# Patient Record
Sex: Male | Born: 1935 | Race: White | Hispanic: No | Smoking: Former smoker
Health system: Southern US, Community
[De-identification: ages and names within clinical notes are randomized; demographics above are authoritative.]

## PROBLEM LIST (undated history)

## (undated) DIAGNOSIS — R1310 Dysphagia, unspecified: Secondary | ICD-10-CM

## (undated) DIAGNOSIS — C801 Malignant (primary) neoplasm, unspecified: Secondary | ICD-10-CM

## (undated) DIAGNOSIS — M1990 Unspecified osteoarthritis, unspecified site: Secondary | ICD-10-CM

## (undated) DIAGNOSIS — I639 Cerebral infarction, unspecified: Secondary | ICD-10-CM

## (undated) DIAGNOSIS — S065XAA Traumatic subdural hemorrhage with loss of consciousness status unknown, initial encounter: Secondary | ICD-10-CM

## (undated) DIAGNOSIS — I10 Essential (primary) hypertension: Secondary | ICD-10-CM

## (undated) DIAGNOSIS — S065X9A Traumatic subdural hemorrhage with loss of consciousness of unspecified duration, initial encounter: Secondary | ICD-10-CM

## (undated) HISTORY — DX: Traumatic subdural hemorrhage with loss of consciousness status unknown, initial encounter: S06.5XAA

## (undated) HISTORY — PX: OTHER SURGICAL HISTORY: SHX169

## (undated) HISTORY — DX: Traumatic subdural hemorrhage with loss of consciousness of unspecified duration, initial encounter: S06.5X9A

## (undated) HISTORY — DX: Dysphagia, unspecified: R13.10

## (undated) HISTORY — DX: Cerebral infarction, unspecified: I63.9

## (undated) HISTORY — PX: PROSTATE CRYOABLATION: SUR358

---

## 2003-05-05 ENCOUNTER — Other Ambulatory Visit: Payer: Self-pay

## 2004-09-11 ENCOUNTER — Ambulatory Visit: Payer: Self-pay | Admitting: Urology

## 2005-03-04 ENCOUNTER — Ambulatory Visit: Payer: Self-pay | Admitting: Urology

## 2005-09-04 ENCOUNTER — Ambulatory Visit: Payer: Self-pay | Admitting: Urology

## 2006-01-12 ENCOUNTER — Emergency Department: Payer: Self-pay | Admitting: Emergency Medicine

## 2006-01-19 ENCOUNTER — Ambulatory Visit: Payer: Self-pay | Admitting: Urology

## 2006-01-19 ENCOUNTER — Other Ambulatory Visit: Payer: Self-pay

## 2006-01-21 ENCOUNTER — Ambulatory Visit: Payer: Self-pay | Admitting: Urology

## 2006-01-29 ENCOUNTER — Ambulatory Visit: Payer: Self-pay | Admitting: Urology

## 2006-09-16 ENCOUNTER — Ambulatory Visit: Payer: Self-pay | Admitting: Urology

## 2007-09-14 ENCOUNTER — Ambulatory Visit: Payer: Self-pay | Admitting: Urology

## 2008-03-14 ENCOUNTER — Ambulatory Visit: Payer: Self-pay | Admitting: Urology

## 2009-03-27 ENCOUNTER — Ambulatory Visit: Payer: Self-pay | Admitting: Urology

## 2010-04-16 ENCOUNTER — Ambulatory Visit: Payer: Self-pay | Admitting: Urology

## 2010-04-18 ENCOUNTER — Ambulatory Visit: Payer: Self-pay | Admitting: Urology

## 2012-12-15 ENCOUNTER — Other Ambulatory Visit: Payer: Self-pay | Admitting: Orthopedic Surgery

## 2017-01-05 ENCOUNTER — Other Ambulatory Visit: Payer: Self-pay | Admitting: Internal Medicine

## 2017-01-05 DIAGNOSIS — D649 Anemia, unspecified: Secondary | ICD-10-CM

## 2017-01-05 DIAGNOSIS — R634 Abnormal weight loss: Secondary | ICD-10-CM

## 2017-01-05 DIAGNOSIS — C61 Malignant neoplasm of prostate: Secondary | ICD-10-CM

## 2017-01-20 ENCOUNTER — Ambulatory Visit
Admission: RE | Admit: 2017-01-20 | Discharge: 2017-01-20 | Disposition: A | Discharge status: Home / Self Care | Payer: Medicare Other | Source: Ambulatory Visit | Attending: Internal Medicine | Admitting: Internal Medicine

## 2017-01-20 DIAGNOSIS — N200 Calculus of kidney: Secondary | ICD-10-CM | POA: Insufficient documentation

## 2017-01-20 DIAGNOSIS — C61 Malignant neoplasm of prostate: Secondary | ICD-10-CM | POA: Insufficient documentation

## 2017-01-20 DIAGNOSIS — N281 Cyst of kidney, acquired: Secondary | ICD-10-CM | POA: Diagnosis not present

## 2017-01-20 DIAGNOSIS — I708 Atherosclerosis of other arteries: Secondary | ICD-10-CM | POA: Diagnosis not present

## 2017-01-20 DIAGNOSIS — D71 Functional disorders of polymorphonuclear neutrophils: Secondary | ICD-10-CM | POA: Diagnosis not present

## 2017-01-20 DIAGNOSIS — R634 Abnormal weight loss: Secondary | ICD-10-CM | POA: Diagnosis present

## 2017-01-20 DIAGNOSIS — D649 Anemia, unspecified: Secondary | ICD-10-CM | POA: Diagnosis present

## 2017-01-20 HISTORY — DX: Essential (primary) hypertension: I10

## 2017-01-20 HISTORY — DX: Malignant (primary) neoplasm, unspecified: C80.1

## 2017-01-20 MED ORDER — IOPAMIDOL (ISOVUE-300) INJECTION 61%
75.0000 mL | Freq: Once | INTRAVENOUS | Status: AC | PRN
  Administered 2017-01-20: 75 mL via INTRAVENOUS

## 2017-10-22 ENCOUNTER — Encounter: Payer: Self-pay | Admitting: Internal Medicine

## 2017-11-24 ENCOUNTER — Other Ambulatory Visit: Payer: Self-pay

## 2017-11-24 ENCOUNTER — Ambulatory Visit: Payer: Medicare Other | Admitting: Gastroenterology

## 2017-11-24 VITALS — BP 154/76 | HR 59 | Ht 71.0 in | Wt 135.4 lb

## 2017-11-24 DIAGNOSIS — R1310 Dysphagia, unspecified: Secondary | ICD-10-CM

## 2017-11-24 NOTE — Progress Notes (Signed)
   Jonathon Bellows MD, MRCP(U.K) [address]  [place]  [place], [place] [address]  Main: [phone]  Fax: [phone]   Gastroenterology Consultation  Referring Provider:     Albina Billet, MD Primary Care Physician:  Tracie Harrier, MD Primary Gastroenterologist:  Dr. Jonathon Bellows  Reason for Consultation:     Dysphagia         HPI:   [name] is a 84 y.o. y/o male referred for consultation & management  by Dr. Tracie Harrier, MD.     He has been referred for difficulty with swallowing . He says that he noticed 4-5 months was having issues swallowing some pills, then he got the smaller pills and then had no issues. Presently he says that the only time he has issue with swallowing is when he eats too fast or does not chew properly then gets stuck in his throat. Liquids go down fine. Quit smoking 15 years back. He says that he lost weight which he says that he quit playing golf and was eating more at that time and since he says he is not eating much . Worked with computers in the past .   Denies any heartburn . No medications for acid reflux. No pain while swallowing.    Past Medical History:  Diagnosis Date  . Cancer ([place])   . Hypertension       Prior to Admission medications   Not on File    No family history on file.   Social History   Tobacco Use  . Smoking status: Not on file  Substance Use Topics  . Alcohol use: Not on file  . Drug use: Not on file    Allergies as of 11/24/2017  . (No Known Allergies)    Review of Systems:    All systems reviewed and negative except where noted in HPI.   Physical Exam:  There were no vitals taken for this visit. No LMP for male patient. Psych:  Alert and cooperative. Normal mood and affect. General:   Alert,  Well-developed, well-nourished, pleasant and cooperative in NAD Head:  Normocephalic and atraumatic. Eyes:  Sclera clear, no icterus.   Conjunctiva pink. Ears:  Normal auditory acuity. Nose:   No deformity, discharge, or lesions. Mouth:  No deformity or lesions,oropharynx pink & moist. Neck:  Supple; no masses or thyromegaly. Lungs:  Respirations even and unlabored.  Clear throughout to auscultation.   No wheezes, crackles, or rhonchi. No acute distress. Heart:  Regular rate and rhythm; no murmurs, clicks, rubs, or gallops. Abdomen:  Normal bowel sounds.  No bruits.  Soft, non-tender and non-distended without masses, hepatosplenomegaly or hernias noted.  No guarding or rebound tenderness.    Neurologic:  Alert and oriented x3;  grossly normal neurologically. Skin:  Intact without significant lesions or rashes. No jaundice. Lymph Nodes:  No significant cervical adenopathy. Psych:  Alert and cooperative. Normal mood and affect.  Imaging Studies: No results found.  Assessment and Plan:   [name] is a 84 y.o. y/o male has been referred for dysphagia.    Plan  1. EGD  I have discussed alternative options, risks & benefits,  which include, but are not limited to, bleeding, infection, perforation,respiratory complication & drug reaction.  The patient agrees with this plan & written consent will be obtained.    Follow up PRN  Dr Jonathon Bellows MD,MRCP(U.K)

## 2017-12-01 ENCOUNTER — Ambulatory Visit
Admission: RE | Admit: 2017-12-01 | Discharge: 2017-12-01 | Disposition: A | Discharge status: Home / Self Care | Payer: Medicare Other | Source: Ambulatory Visit | Attending: Gastroenterology | Admitting: Gastroenterology

## 2017-12-01 ENCOUNTER — Encounter
Admission: RE | Disposition: A | Discharge status: Home / Self Care | Payer: Self-pay | Source: Ambulatory Visit | Attending: Gastroenterology

## 2017-12-01 DIAGNOSIS — R1310 Dysphagia, unspecified: Secondary | ICD-10-CM

## 2017-12-01 SURGERY — ESOPHAGOGASTRODUODENOSCOPY (EGD) WITH PROPOFOL
Anesthesia: General

## 2017-12-01 MED ORDER — SODIUM CHLORIDE 0.9 % IV SOLN: INTRAVENOUS | Status: DC

## 2017-12-03 ENCOUNTER — Encounter: Payer: Self-pay | Admitting: Emergency Medicine

## 2017-12-06 ENCOUNTER — Encounter: Payer: Self-pay | Admitting: Certified Registered Nurse Anesthetist

## 2017-12-06 ENCOUNTER — Ambulatory Visit: Payer: Medicare Other | Admitting: Certified Registered Nurse Anesthetist

## 2017-12-06 ENCOUNTER — Ambulatory Visit
Admission: RE | Admit: 2017-12-06 | Discharge: 2017-12-06 | Disposition: A | Discharge status: Home / Self Care | Payer: Medicare Other | Source: Ambulatory Visit | Attending: Gastroenterology | Admitting: Gastroenterology

## 2017-12-06 ENCOUNTER — Encounter
Admission: RE | Disposition: A | Discharge status: Home / Self Care | Payer: Self-pay | Source: Ambulatory Visit | Attending: Gastroenterology

## 2017-12-06 DIAGNOSIS — Z7982 Long term (current) use of aspirin: Secondary | ICD-10-CM | POA: Diagnosis not present

## 2017-12-06 DIAGNOSIS — Z79899 Other long term (current) drug therapy: Secondary | ICD-10-CM | POA: Diagnosis not present

## 2017-12-06 DIAGNOSIS — Z87891 Personal history of nicotine dependence: Secondary | ICD-10-CM | POA: Diagnosis not present

## 2017-12-06 DIAGNOSIS — I10 Essential (primary) hypertension: Secondary | ICD-10-CM | POA: Diagnosis not present

## 2017-12-06 DIAGNOSIS — K2270 Barrett's esophagus without dysplasia: Secondary | ICD-10-CM | POA: Insufficient documentation

## 2017-12-06 DIAGNOSIS — R1310 Dysphagia, unspecified: Secondary | ICD-10-CM | POA: Diagnosis present

## 2017-12-06 DIAGNOSIS — Z8546 Personal history of malignant neoplasm of prostate: Secondary | ICD-10-CM | POA: Insufficient documentation

## 2017-12-06 HISTORY — PX: ESOPHAGOGASTRODUODENOSCOPY (EGD) WITH PROPOFOL: SHX5813

## 2017-12-06 HISTORY — DX: Unspecified osteoarthritis, unspecified site: M19.90

## 2017-12-06 SURGERY — ESOPHAGOGASTRODUODENOSCOPY (EGD) WITH PROPOFOL
Anesthesia: General

## 2017-12-06 MED ORDER — PROPOFOL 500 MG/50ML IV EMUL
INTRAVENOUS | Status: DC | PRN
  Administered 2017-12-06: 175 ug/kg/min via INTRAVENOUS

## 2017-12-06 MED ORDER — PHENYLEPHRINE HCL 10 MG/ML IJ SOLN
INTRAMUSCULAR | Status: AC
  Filled 2017-12-06: qty 1

## 2017-12-06 MED ORDER — EPHEDRINE SULFATE 50 MG/ML IJ SOLN
INTRAMUSCULAR | Status: AC
  Filled 2017-12-06: qty 1

## 2017-12-06 MED ORDER — SODIUM CHLORIDE 0.9 % IV SOLN
INTRAVENOUS | Status: DC
  Administered 2017-12-06: 08:00:00 via INTRAVENOUS

## 2017-12-06 MED ORDER — LIDOCAINE HCL (CARDIAC) PF 100 MG/5ML IV SOSY
PREFILLED_SYRINGE | INTRAVENOUS | Status: DC | PRN
  Administered 2017-12-06: 50 mg via INTRAVENOUS

## 2017-12-06 MED ORDER — PROPOFOL 10 MG/ML IV BOLUS
INTRAVENOUS | Status: DC | PRN
  Administered 2017-12-06: 50 mg via INTRAVENOUS

## 2017-12-06 MED ORDER — LIDOCAINE HCL (PF) 2 % IJ SOLN
INTRAMUSCULAR | Status: AC
  Filled 2017-12-06: qty 10

## 2017-12-06 NOTE — Op Note (Signed)
[facility] Gastroenterology Patient Name: [name] Procedure Date: 12/06/2017 7:51 AM MRN: [id number] Account #: [id number] Date of Birth: May 26, 1933 Admit Type: Outpatient Age: 84 Room: [facility] ENDO ROOM 4 Gender: Male Note Status: Finalized Procedure:            Upper GI endoscopy Indications:          Dysphagia Providers:            Jonathon Bellows MD, MD Referring MD:         Leona Carry. Hall Busing, MD (Referring MD) Medicines:            Monitored Anesthesia Care Procedure:            Pre-Anesthesia Assessment:                       - Prior to the procedure, a History and Physical was                        performed, and patient medications, allergies and                        sensitivities were reviewed. The patient's tolerance of                        previous anesthesia was reviewed.                       - The risks and benefits of the procedure and the                        sedation options and risks were discussed with the                        patient. All questions were answered and informed                        consent was obtained.                       - ASA Grade Assessment: III - A patient with severe                        systemic disease.                       After obtaining informed consent, the endoscope was                        passed under direct vision. Throughout the procedure,                        the patient's blood pressure, pulse, and oxygen                        saturations were monitored continuously. The Endoscope                        was introduced through the mouth, and advanced to the                        third part of duodenum. The upper GI endoscopy  was                        accomplished with ease. The patient tolerated the                        procedure well. Findings:      The examined duodenum was normal.      The stomach was normal.      The cardia and gastric fundus were normal on retroflexion.      One  tongue of salmon-colored mucosa was present from 42 to 43 cm. No       other visible abnormalities were present. The maximum longitudinal       extent of these esophageal mucosal changes was 1 cm in length. Biopsies       were taken with a cold forceps for histology. Biopsies were obtained       from the proximal and distal esophagus with cold forceps for histology       of suspected eosinophilic esophagitis. Impression:           - Normal examined duodenum.                       - Normal stomach.                       - Salmon-colored mucosa suspicious for short-segment                        Barrett's esophagus. Biopsied. Recommendation:       - Discharge patient to home (with escort).                       - Resume previous diet.                       - Continue present medications.                       - Await pathology results.                       - Return to my office in 2 weeks. Procedure Code(s):    --- Professional ---                       [phone], Esophagogastroduodenoscopy, flexible, transoral;                        with biopsy, single or multiple Diagnosis Code(s):    --- Professional ---                       K22.8, Other specified diseases of esophagus                       R13.10, Dysphagia, unspecified CPT copyright 2017 American Medical Association. All rights reserved. The codes documented in this report are preliminary and upon coder review may  be revised to meet current compliance requirements. Jonathon Bellows, MD Jonathon Bellows MD, MD 12/06/2017 8:09:12 AM This report has been signed electronically. Number of Addenda: 0 Note Initiated On: 12/06/2017 7:51 AM      [facility]

## 2017-12-06 NOTE — Anesthesia Post-op Follow-up Note (Signed)
Anesthesia QCDR form completed.        

## 2017-12-06 NOTE — Transfer of Care (Signed)
Immediate Anesthesia Transfer of Care Note  Patient: [name]  Procedure(s) Performed: ESOPHAGOGASTRODUODENOSCOPY (EGD) WITH PROPOFOL (N/A )  Patient Location: PACU  Anesthesia Type:Glenn  Level of Consciousness: awake, alert  and oriented  Airway & Oxygen Therapy: Patient Spontanous Breathing  Post-op Assessment: Report given to RN and Post -op Vital signs reviewed and stable  Post vital signs: Reviewed and stable  Last Vitals:  Vitals Value Taken Time  BP 148/91 12/06/2017  8:14 AM  Temp 36.1 C 12/06/2017  8:14 AM  Pulse 67 12/06/2017  8:14 AM  Resp 18 12/06/2017  8:14 AM  SpO2 100 % 12/06/2017  8:14 AM  Vitals shown include unvalidated device data.  Last Pain:  Vitals:   12/06/17 0814  TempSrc: Tympanic  PainSc:          Complications: No apparent anesthesia complications

## 2017-12-06 NOTE — Anesthesia Postprocedure Evaluation (Signed)
Anesthesia Post Note  Patient: [name]  Procedure(s) Performed: ESOPHAGOGASTRODUODENOSCOPY (EGD) WITH PROPOFOL (N/A )  Patient location during evaluation: PACU Anesthesia Type: General Level of consciousness: awake and alert Pain management: pain level controlled Vital Signs Assessment: post-procedure vital signs reviewed and stable Respiratory status: spontaneous breathing, nonlabored ventilation, respiratory function stable and patient connected to nasal cannula oxygen Cardiovascular status: blood pressure returned to baseline and stable Postop Assessment: no apparent nausea or vomiting Anesthetic complications: no     Last Vitals:  Vitals:   12/06/17 0814 12/06/17 0833  BP: (!) 148/91 (!) 174/91  Pulse: 73   Resp: 20   Temp: (!) 36.1 C   SpO2: 100%     Last Pain:  Vitals:   12/06/17 0834  TempSrc:   PainSc: 0-No pain                 Molli Barrows

## 2017-12-06 NOTE — H&P (Signed)
Jonathon Bellows, MD [address] [place], [place], [place], [address] [address], [place], [place], [place], [address] Phone: [phone]  Fax: [phone]  Primary Care Physician:  Albina Billet, MD   Pre-Procedure History & Physical: HPI:  [name] is a 84 y.o. male is here for an endoscopy    Past Medical History:  Diagnosis Date  . Arthritis   . Cancer [facility])    Prostate Cancer  . Hypertension     Past Surgical History:  Procedure Laterality Date  . PROSTATE CRYOABLATION      Prior to Admission medications   Medication Sig Start Date End Date Taking? Authorizing Provider  aspirin 325 MG EC tablet Take 325 mg by mouth every 4 (four) hours as needed for pain (2 tablets every 4 hours during the day for arthritis pain).   Yes [provider]  lisinopril (PRINIVIL,ZESTRIL) 40 MG tablet TAKE 1 TABLET BY MOUTH ONCE DAILY 12/31/14  Yes [provider]    Allergies as of 12/01/2017  . (No Known Allergies)    History reviewed. No pertinent family history.  Social History   Socioeconomic History  . Marital status: Widowed    Spouse name: Not on file  . Number of children: Not on file  . Years of education: Not on file  . Highest education level: Not on file  Occupational History  . Not on file  Social Needs  . Financial resource strain: Not on file  . Food insecurity:    Worry: Not on file    Inability: Not on file  . Transportation needs:    Medical: Not on file    Non-medical: Not on file  Tobacco Use  . Smoking status: Former Smoker    Types: Pipe    Last attempt to quit: 03/30/2002    Years since quitting: 15.6  . Smokeless tobacco: Never Used  Substance and Sexual Activity  . Alcohol use: Not on file  . Drug use: Never  . Sexual activity: Not on file  Lifestyle  . Physical activity:    Days per week: Not on file    Minutes per session: Not on file  . Stress: Not on file  Relationships  . Social connections:   Talks on phone: Not on file    Gets together: Not on file    Attends religious service: Not on file    Active member of club or organization: Not on file    Attends meetings of clubs or organizations: Not on file    Relationship status: Not on file  . Intimate partner violence:    Fear of current or ex partner: Not on file    Emotionally abused: Not on file    Physically abused: Not on file    Forced sexual activity: Not on file  Other Topics Concern  . Not on file  Social History Narrative  . Not on file    Review of Systems: See HPI, otherwise negative ROS  Physical Exam: BP (!) 178/105   Pulse 63   Temp (!) 97 F (36.1 C) (Tympanic)   Resp 16   SpO2 98%  General:   Alert,  pleasant and cooperative in NAD Head:  Normocephalic and atraumatic. Neck:  Supple; no masses or thyromegaly. Lungs:  Clear throughout to auscultation, normal respiratory effort.    Heart:  +S1, +S2, Regular rate and rhythm, No edema. Abdomen:  Soft, nontender and nondistended. Normal bowel sounds, without guarding,  and without rebound.   Neurologic:  Alert and  oriented x4;  grossly normal neurologically.  Impression/Plan: [name] is here for an endoscopy  to be performed for  evaluation of dysphagia    Risks, benefits, limitations, and alternatives regarding endoscopy and dilation have been reviewed with the patient.  Questions have been answered.  All parties agreeable.   Jonathon Bellows, MD  12/06/2017, 7:51 AM

## 2017-12-06 NOTE — Anesthesia Procedure Notes (Signed)
Date/Time: 12/06/2017 7:56 AM Performed by: Johnna Acosta, CRNA Pre-anesthesia Checklist: Patient identified, Emergency Drugs available, Suction available, Patient being monitored and Timeout performed Patient Re-evaluated:Patient Re-evaluated prior to induction Oxygen Delivery Method: Nasal cannula Preoxygenation: Pre-oxygenation with 100% oxygen

## 2017-12-06 NOTE — Anesthesia Preprocedure Evaluation (Signed)
Anesthesia Evaluation  Patient identified by MRN, date of birth, ID band Patient awake    Reviewed: Allergy & Precautions, H&P , NPO status , Patient's Chart, lab work & pertinent test results, reviewed documented beta blocker date and time   Airway Mallampati: II   Neck ROM: full    Dental  (+) Poor Dentition   Pulmonary neg pulmonary ROS, former smoker,    Pulmonary exam normal        Cardiovascular Exercise Tolerance: Good hypertension, On Medications negative cardio ROS Normal cardiovascular exam Rhythm:regular Rate:Normal     Neuro/Psych negative neurological ROS  negative psych ROS   GI/Hepatic negative GI ROS, Neg liver ROS,   Endo/Other  negative endocrine ROS  Renal/GU negative Renal ROS  negative genitourinary   Musculoskeletal   Abdominal   Peds  Hematology negative hematology ROS (+)   Anesthesia Other Findings Past Medical History: No date: Arthritis No date: Cancer ([place])     Comment:  Prostate Cancer No date: Hypertension Past Surgical History: No date: PROSTATE CRYOABLATION   Reproductive/Obstetrics negative OB ROS                             Anesthesia Physical Anesthesia Plan  ASA: II  Anesthesia Plan: General   Post-op Pain Management:    Induction:   PONV Risk Score and Plan:   Airway Management Planned:   Additional Equipment:   Intra-op Plan:   Post-operative Plan:   Informed Consent: I have reviewed the patients History and Physical, chart, labs and discussed the procedure including the risks, benefits and alternatives for the proposed anesthesia with the patient or authorized representative who has indicated his/her understanding and acceptance.   Dental Advisory Given  Plan Discussed with: CRNA  Anesthesia Plan Comments:         Anesthesia Quick Evaluation

## 2017-12-07 ENCOUNTER — Encounter: Payer: Self-pay | Admitting: Gastroenterology

## 2017-12-08 LAB — SURGICAL PATHOLOGY

## 2017-12-12 ENCOUNTER — Encounter: Payer: Self-pay | Admitting: Gastroenterology

## 2017-12-30 ENCOUNTER — Encounter: Payer: Self-pay | Admitting: Gastroenterology

## 2017-12-30 ENCOUNTER — Ambulatory Visit (INDEPENDENT_AMBULATORY_CARE_PROVIDER_SITE_OTHER): Payer: Medicare Other | Admitting: Gastroenterology

## 2017-12-30 VITALS — BP 154/82 | HR 61 | Ht 71.0 in | Wt 139.0 lb

## 2017-12-30 DIAGNOSIS — R1310 Dysphagia, unspecified: Secondary | ICD-10-CM | POA: Diagnosis not present

## 2017-12-30 NOTE — Progress Notes (Signed)
   Jonathon Bellows MD, MRCP(U.K) [address]  [place]  [place], [place] [address]  Main: [phone]  Fax: [phone]   Primary Care Physician: Albina Billet, MD  Primary Gastroenterologist:  Dr. Jonathon Bellows   No chief complaint on file.   HPI: YASHAR INCLAN is a 84 y.o. male   Summary of history : He has been referred for difficulty with swallowing . He says that he noticed 4-5 months was having issues swallowing some pills, then he got the smaller pills and then had no issues. Presently he says that the only time he has issue with swallowing is when he eats too fast or does not chew properly then gets stuck in his throat. Liquids go down fine. Quit smoking 15 years back. He says that he lost weight which he says that he quit playing golf and was eating more at that time and since he says he is not eating much . Worked with computers in the past .   Denies any heartburn . No medications for acid reflux. No pain while swallowing.   Interval history  11/24/17-12/30/17   12/06/17: EGD: no stricture - biopsies show inflammation of the Ge junction .   No issues swallowing . Doing very well .    Current Outpatient Medications  Medication Sig Dispense Refill  . aspirin 325 MG EC tablet Take 325 mg by mouth every 4 (four) hours as needed for pain (2 tablets every 4 hours during the day for arthritis pain).    Marland Kitchen lisinopril (PRINIVIL,ZESTRIL) 40 MG tablet TAKE 1 TABLET BY MOUTH ONCE DAILY     No current facility-administered medications for this visit.     Allergies as of 12/30/2017  . (No Known Allergies)    ROS:  General: Negative for anorexia, weight loss, fever, chills, fatigue, weakness. ENT: Negative for hoarseness, difficulty swallowing , nasal congestion. CV: Negative for chest pain, angina, palpitations, dyspnea on exertion, peripheral edema.  Respiratory: Negative for dyspnea at rest, dyspnea on exertion, cough, sputum, wheezing.  GI: See history of present  illness. GU:  Negative for dysuria, hematuria, urinary incontinence, urinary frequency, nocturnal urination.  Endo: Negative for unusual weight change.    Physical Examination:   There were no vitals taken for this visit.  General: Well-nourished, well-developed in no acute distress.  Eyes: No icterus. Conjunctivae pink. Mouth: Oropharyngeal mucosa moist and pink , no lesions erythema or exudate. Lungs: Clear to auscultation bilaterally. Non-labored. Heart: Regular rate and rhythm, no murmurs rubs or gallops.  Abdomen: Bowel sounds are normal, nontender, nondistended, no hepatosplenomegaly or masses, no abdominal bruits or hernia , no rebound or guarding.   Extremities: No lower extremity edema. No clubbing or deformities. Neuro: Alert and oriented x 3.  Grossly intact. Skin: Warm and dry, no jaundice.   Psych: Alert and cooperative, normal mood and affect.   Imaging Studies: No results found.  Assessment and Plan:   ATHA MCBAIN is a 84 y.o. y/o male for dysphagia. All issues have resolved. No stricture on EGD   Dr Jonathon Bellows  MD,MRCP [facility]) Follow up as needed

## 2018-09-19 ENCOUNTER — Emergency Department: Payer: Medicare Other

## 2018-09-19 ENCOUNTER — Other Ambulatory Visit: Payer: Self-pay

## 2018-09-19 ENCOUNTER — Inpatient Hospital Stay
Admission: EM | Admit: 2018-09-19 | Discharge: 2018-10-14 | DRG: 082 | Disposition: A | Discharge status: Skilled Nursing Facility | Payer: Medicare Other | Attending: Internal Medicine | Admitting: Internal Medicine

## 2018-09-19 DIAGNOSIS — I951 Orthostatic hypotension: Secondary | ICD-10-CM | POA: Diagnosis present

## 2018-09-19 DIAGNOSIS — S40812A Abrasion of left upper arm, initial encounter: Secondary | ICD-10-CM | POA: Diagnosis present

## 2018-09-19 DIAGNOSIS — D649 Anemia, unspecified: Secondary | ICD-10-CM

## 2018-09-19 DIAGNOSIS — R7401 Elevation of levels of liver transaminase levels: Secondary | ICD-10-CM

## 2018-09-19 DIAGNOSIS — D61818 Other pancytopenia: Secondary | ICD-10-CM | POA: Diagnosis present

## 2018-09-19 DIAGNOSIS — Z931 Gastrostomy status: Secondary | ICD-10-CM | POA: Diagnosis not present

## 2018-09-19 DIAGNOSIS — Z66 Do not resuscitate: Secondary | ICD-10-CM | POA: Diagnosis present

## 2018-09-19 DIAGNOSIS — D696 Thrombocytopenia, unspecified: Secondary | ICD-10-CM

## 2018-09-19 DIAGNOSIS — S0083XA Contusion of other part of head, initial encounter: Secondary | ICD-10-CM | POA: Diagnosis not present

## 2018-09-19 DIAGNOSIS — R55 Syncope and collapse: Secondary | ICD-10-CM

## 2018-09-19 DIAGNOSIS — R972 Elevated prostate specific antigen [PSA]: Secondary | ICD-10-CM

## 2018-09-19 DIAGNOSIS — Z681 Body mass index (BMI) 19 or less, adult: Secondary | ICD-10-CM | POA: Diagnosis not present

## 2018-09-19 DIAGNOSIS — K921 Melena: Secondary | ICD-10-CM

## 2018-09-19 DIAGNOSIS — I10 Essential (primary) hypertension: Secondary | ICD-10-CM | POA: Diagnosis present

## 2018-09-19 DIAGNOSIS — R4702 Dysphasia: Secondary | ICD-10-CM | POA: Diagnosis present

## 2018-09-19 DIAGNOSIS — S40811A Abrasion of right upper arm, initial encounter: Secondary | ICD-10-CM | POA: Diagnosis present

## 2018-09-19 DIAGNOSIS — Z79899 Other long term (current) drug therapy: Secondary | ICD-10-CM

## 2018-09-19 DIAGNOSIS — K264 Chronic or unspecified duodenal ulcer with hemorrhage: Secondary | ICD-10-CM | POA: Diagnosis present

## 2018-09-19 DIAGNOSIS — E440 Moderate protein-calorie malnutrition: Secondary | ICD-10-CM | POA: Diagnosis present

## 2018-09-19 DIAGNOSIS — E870 Hyperosmolality and hypernatremia: Secondary | ICD-10-CM | POA: Diagnosis not present

## 2018-09-19 DIAGNOSIS — R9721 Rising PSA following treatment for malignant neoplasm of prostate: Secondary | ICD-10-CM | POA: Diagnosis present

## 2018-09-19 DIAGNOSIS — R471 Dysarthria and anarthria: Secondary | ICD-10-CM | POA: Diagnosis present

## 2018-09-19 DIAGNOSIS — Y92009 Unspecified place in unspecified non-institutional (private) residence as the place of occurrence of the external cause: Secondary | ICD-10-CM | POA: Diagnosis not present

## 2018-09-19 DIAGNOSIS — R509 Fever, unspecified: Secondary | ICD-10-CM | POA: Diagnosis not present

## 2018-09-19 DIAGNOSIS — S065X9A Traumatic subdural hemorrhage with loss of consciousness of unspecified duration, initial encounter: Principal | ICD-10-CM | POA: Diagnosis present

## 2018-09-19 DIAGNOSIS — R740 Nonspecific elevation of levels of transaminase and lactic acid dehydrogenase [LDH]: Secondary | ICD-10-CM | POA: Diagnosis not present

## 2018-09-19 DIAGNOSIS — S0990XA Unspecified injury of head, initial encounter: Secondary | ICD-10-CM

## 2018-09-19 DIAGNOSIS — R778 Other specified abnormalities of plasma proteins: Secondary | ICD-10-CM

## 2018-09-19 DIAGNOSIS — M8580 Other specified disorders of bone density and structure, unspecified site: Secondary | ICD-10-CM | POA: Diagnosis present

## 2018-09-19 DIAGNOSIS — N179 Acute kidney failure, unspecified: Secondary | ICD-10-CM | POA: Diagnosis present

## 2018-09-19 DIAGNOSIS — A7749 Other ehrlichiosis: Secondary | ICD-10-CM | POA: Diagnosis present

## 2018-09-19 DIAGNOSIS — B9561 Methicillin susceptible Staphylococcus aureus infection as the cause of diseases classified elsewhere: Secondary | ICD-10-CM | POA: Diagnosis not present

## 2018-09-19 DIAGNOSIS — D62 Acute posthemorrhagic anemia: Secondary | ICD-10-CM | POA: Diagnosis present

## 2018-09-19 DIAGNOSIS — E876 Hypokalemia: Secondary | ICD-10-CM | POA: Diagnosis present

## 2018-09-19 DIAGNOSIS — W19XXXA Unspecified fall, initial encounter: Secondary | ICD-10-CM | POA: Diagnosis present

## 2018-09-19 DIAGNOSIS — R58 Hemorrhage, not elsewhere classified: Secondary | ICD-10-CM | POA: Diagnosis not present

## 2018-09-19 DIAGNOSIS — K9422 Gastrostomy infection: Secondary | ICD-10-CM | POA: Diagnosis not present

## 2018-09-19 DIAGNOSIS — B951 Streptococcus, group B, as the cause of diseases classified elsewhere: Secondary | ICD-10-CM | POA: Diagnosis not present

## 2018-09-19 DIAGNOSIS — R05 Cough: Secondary | ICD-10-CM

## 2018-09-19 DIAGNOSIS — M25551 Pain in right hip: Secondary | ICD-10-CM | POA: Diagnosis present

## 2018-09-19 DIAGNOSIS — K2270 Barrett's esophagus without dysplasia: Secondary | ICD-10-CM | POA: Diagnosis present

## 2018-09-19 DIAGNOSIS — R059 Cough, unspecified: Secondary | ICD-10-CM

## 2018-09-19 DIAGNOSIS — Z87891 Personal history of nicotine dependence: Secondary | ICD-10-CM | POA: Diagnosis not present

## 2018-09-19 DIAGNOSIS — L89151 Pressure ulcer of sacral region, stage 1: Secondary | ICD-10-CM | POA: Diagnosis not present

## 2018-09-19 DIAGNOSIS — R1311 Dysphagia, oral phase: Secondary | ICD-10-CM | POA: Diagnosis not present

## 2018-09-19 DIAGNOSIS — M25552 Pain in left hip: Secondary | ICD-10-CM | POA: Diagnosis present

## 2018-09-19 DIAGNOSIS — Z7189 Other specified counseling: Secondary | ICD-10-CM | POA: Diagnosis not present

## 2018-09-19 DIAGNOSIS — L8990 Pressure ulcer of unspecified site, unspecified stage: Secondary | ICD-10-CM | POA: Insufficient documentation

## 2018-09-19 DIAGNOSIS — Z20828 Contact with and (suspected) exposure to other viral communicable diseases: Secondary | ICD-10-CM | POA: Diagnosis present

## 2018-09-19 DIAGNOSIS — D689 Coagulation defect, unspecified: Secondary | ICD-10-CM | POA: Diagnosis not present

## 2018-09-19 DIAGNOSIS — D500 Iron deficiency anemia secondary to blood loss (chronic): Secondary | ICD-10-CM

## 2018-09-19 DIAGNOSIS — A7740 Ehrlichiosis, unspecified: Secondary | ICD-10-CM | POA: Diagnosis not present

## 2018-09-19 DIAGNOSIS — Z7982 Long term (current) use of aspirin: Secondary | ICD-10-CM

## 2018-09-19 DIAGNOSIS — R945 Abnormal results of liver function studies: Secondary | ICD-10-CM | POA: Diagnosis not present

## 2018-09-19 DIAGNOSIS — R1310 Dysphagia, unspecified: Secondary | ICD-10-CM

## 2018-09-19 DIAGNOSIS — M1990 Unspecified osteoarthritis, unspecified site: Secondary | ICD-10-CM | POA: Diagnosis present

## 2018-09-19 DIAGNOSIS — D509 Iron deficiency anemia, unspecified: Secondary | ICD-10-CM | POA: Diagnosis present

## 2018-09-19 DIAGNOSIS — Z960 Presence of urogenital implants: Secondary | ICD-10-CM | POA: Diagnosis not present

## 2018-09-19 DIAGNOSIS — W57XXXA Bitten or stung by nonvenomous insect and other nonvenomous arthropods, initial encounter: Secondary | ICD-10-CM | POA: Diagnosis present

## 2018-09-19 DIAGNOSIS — S0003XA Contusion of scalp, initial encounter: Secondary | ICD-10-CM | POA: Diagnosis not present

## 2018-09-19 DIAGNOSIS — Z8546 Personal history of malignant neoplasm of prostate: Secondary | ICD-10-CM | POA: Diagnosis not present

## 2018-09-19 DIAGNOSIS — R296 Repeated falls: Secondary | ICD-10-CM | POA: Diagnosis present

## 2018-09-19 DIAGNOSIS — Z9889 Other specified postprocedural states: Secondary | ICD-10-CM | POA: Diagnosis not present

## 2018-09-19 DIAGNOSIS — I639 Cerebral infarction, unspecified: Secondary | ICD-10-CM | POA: Diagnosis not present

## 2018-09-19 DIAGNOSIS — Z515 Encounter for palliative care: Secondary | ICD-10-CM | POA: Diagnosis not present

## 2018-09-19 DIAGNOSIS — E860 Dehydration: Secondary | ICD-10-CM | POA: Diagnosis present

## 2018-09-19 DIAGNOSIS — R000 Tachycardia, unspecified: Secondary | ICD-10-CM | POA: Diagnosis present

## 2018-09-19 DIAGNOSIS — M25559 Pain in unspecified hip: Secondary | ICD-10-CM

## 2018-09-19 DIAGNOSIS — R791 Abnormal coagulation profile: Secondary | ICD-10-CM | POA: Diagnosis not present

## 2018-09-19 DIAGNOSIS — R634 Abnormal weight loss: Secondary | ICD-10-CM | POA: Diagnosis not present

## 2018-09-19 DIAGNOSIS — R7989 Other specified abnormal findings of blood chemistry: Secondary | ICD-10-CM

## 2018-09-19 DIAGNOSIS — A4901 Methicillin susceptible Staphylococcus aureus infection, unspecified site: Secondary | ICD-10-CM | POA: Diagnosis not present

## 2018-09-19 LAB — CBC WITH DIFFERENTIAL/PLATELET
Abs Immature Granulocytes: 0 10*3/uL (ref 0.00–0.07)
Basophils Absolute: 0 10*3/uL (ref 0.0–0.1)
Basophils Relative: 0 %
Eosinophils Absolute: 0 10*3/uL (ref 0.0–0.5)
Eosinophils Relative: 0 %
HCT: 28.5 % — ABNORMAL LOW (ref 39.0–52.0)
Hemoglobin: 9.9 g/dL — ABNORMAL LOW (ref 13.0–17.0)
Lymphocytes Relative: 5 %
Lymphs Abs: 0.2 10*3/uL — ABNORMAL LOW (ref 0.7–4.0)
MCH: 29.9 pg (ref 26.0–34.0)
MCHC: 34.7 g/dL (ref 30.0–36.0)
MCV: 86.1 fL (ref 80.0–100.0)
Monocytes Absolute: 0.1 10*3/uL (ref 0.1–1.0)
Monocytes Relative: 3 %
Neutro Abs: 4.1 10*3/uL (ref 1.7–7.7)
Neutrophils Relative %: 92 %
Platelets: 57 10*3/uL — ABNORMAL LOW (ref 150–400)
RBC: 3.31 MIL/uL — ABNORMAL LOW (ref 4.22–5.81)
RDW: 13 % (ref 11.5–15.5)
Smear Review: NORMAL
WBC: 4.5 10*3/uL (ref 4.0–10.5)
nRBC: 0 % (ref 0.0–0.2)

## 2018-09-19 LAB — URINALYSIS, COMPLETE (UACMP) WITH MICROSCOPIC
Bilirubin Urine: NEGATIVE
Glucose, UA: NEGATIVE mg/dL
Ketones, ur: NEGATIVE mg/dL
Leukocytes,Ua: NEGATIVE
Nitrite: NEGATIVE
Protein, ur: 30 mg/dL — AB
RBC / HPF: >50 RBC/hpf — ABNORMAL HIGH (ref 0–5)
Specific Gravity, Urine: 1.01 (ref 1.005–1.030)
pH: 5 (ref 5.0–8.0)

## 2018-09-19 LAB — COMPREHENSIVE METABOLIC PANEL
ALT: 74 U/L — ABNORMAL HIGH (ref 0–44)
AST: 158 U/L — ABNORMAL HIGH (ref 15–41)
Albumin: 3 g/dL — ABNORMAL LOW (ref 3.5–5.0)
Alkaline Phosphatase: 76 U/L (ref 38–126)
Anion gap: 11 (ref 5–15)
BUN: 46 mg/dL — ABNORMAL HIGH (ref 8–23)
CO2: 20 mmol/L — ABNORMAL LOW (ref 22–32)
Calcium: 7.9 mg/dL — ABNORMAL LOW (ref 8.9–10.3)
Chloride: 105 mmol/L (ref 98–111)
Creatinine, Ser: 1.81 mg/dL — ABNORMAL HIGH (ref 0.61–1.24)
GFR calc Af Amer: 39 mL/min — ABNORMAL LOW (ref >60)
GFR calc non Af Amer: 34 mL/min — ABNORMAL LOW (ref >60)
Glucose, Bld: 145 mg/dL — ABNORMAL HIGH (ref 70–99)
Potassium: 2.5 mmol/L — CL (ref 3.5–5.1)
Sodium: 136 mmol/L (ref 135–145)
Total Bilirubin: 0.9 mg/dL (ref 0.3–1.2)
Total Protein: 5.5 g/dL — ABNORMAL LOW (ref 6.5–8.1)

## 2018-09-19 LAB — TROPONIN I
Troponin I: 0.19 ng/mL (ref <0.03)
Troponin I: 0.17 ng/mL (ref <0.03)

## 2018-09-19 LAB — LACTIC ACID, PLASMA: Lactic Acid, Venous: 1.8 mmol/L (ref 0.5–1.9)

## 2018-09-19 LAB — SARS CORONAVIRUS 2 BY RT PCR (HOSPITAL ORDER, PERFORMED IN ~~LOC~~ HOSPITAL LAB): SARS Coronavirus 2: NEGATIVE

## 2018-09-19 LAB — POTASSIUM: Potassium: 3.1 mmol/L — ABNORMAL LOW (ref 3.5–5.1)

## 2018-09-19 LAB — GLUCOSE, CAPILLARY: Glucose-Capillary: 148 mg/dL — ABNORMAL HIGH (ref 70–99)

## 2018-09-19 MED ORDER — ACETAMINOPHEN 500 MG PO TABS
1000.0000 mg | ORAL_TABLET | Freq: Once | ORAL | Status: AC
  Administered 2018-09-19: 1000 mg via ORAL
  Filled 2018-09-19: qty 2

## 2018-09-19 MED ORDER — KCL IN DEXTROSE-NACL 40-5-0.45 MEQ/L-%-% IV SOLN
INTRAVENOUS | Status: DC
  Administered 2018-09-19: 18:00:00 via INTRAVENOUS
  Filled 2018-09-19 (×6): qty 1000

## 2018-09-19 MED ORDER — ONDANSETRON HCL 4 MG PO TABS: 4.0000 mg | ORAL_TABLET | Freq: Four times a day (QID) | ORAL | Status: DC | PRN

## 2018-09-19 MED ORDER — ACETAMINOPHEN 325 MG PO TABS
650.0000 mg | ORAL_TABLET | Freq: Four times a day (QID) | ORAL | Status: DC | PRN
  Administered 2018-09-19 – 2018-09-20 (×3): 650 mg via ORAL
  Filled 2018-09-19 (×4): qty 2

## 2018-09-19 MED ORDER — ASPIRIN EC 325 MG PO TBEC: 325.0000 mg | DELAYED_RELEASE_TABLET | ORAL | Status: DC | PRN

## 2018-09-19 MED ORDER — ASPIRIN 81 MG PO CHEW
324.0000 mg | CHEWABLE_TABLET | Freq: Once | ORAL | Status: AC
  Administered 2018-09-19: 18:00:00 324 mg via ORAL
  Filled 2018-09-19: qty 4

## 2018-09-19 MED ORDER — SODIUM CHLORIDE 0.9 % IV SOLN
Freq: Once | INTRAVENOUS | Status: AC
  Administered 2018-09-19: 16:00:00 via INTRAVENOUS

## 2018-09-19 MED ORDER — POLYETHYLENE GLYCOL 3350 17 G PO PACK: 17.0000 g | PACK | Freq: Every day | ORAL | Status: DC | PRN

## 2018-09-19 MED ORDER — ACETAMINOPHEN 500 MG PO TABS
ORAL_TABLET | ORAL | Status: AC
  Filled 2018-09-19: qty 2

## 2018-09-19 MED ORDER — POTASSIUM CHLORIDE CRYS ER 20 MEQ PO TBCR
40.0000 meq | EXTENDED_RELEASE_TABLET | Freq: Once | ORAL | Status: AC
  Administered 2018-09-19: 18:00:00 40 meq via ORAL
  Filled 2018-09-19: qty 2

## 2018-09-19 MED ORDER — ONDANSETRON HCL 4 MG/2ML IJ SOLN: 4.0000 mg | Freq: Four times a day (QID) | INTRAMUSCULAR | Status: DC | PRN

## 2018-09-19 MED ORDER — HYDROCODONE-ACETAMINOPHEN 5-325 MG PO TABS: 1.0000 | ORAL_TABLET | ORAL | Status: DC | PRN

## 2018-09-19 MED ORDER — SODIUM CHLORIDE 0.9 % IV SOLN
INTRAVENOUS | Status: DC
  Administered 2018-09-19 – 2018-09-21 (×2): via INTRAVENOUS

## 2018-09-19 MED ORDER — ACETAMINOPHEN 650 MG RE SUPP: 650.0000 mg | Freq: Four times a day (QID) | RECTAL | Status: DC | PRN

## 2018-09-19 NOTE — H&P (Signed)
[facility] [place] NAME: [name]    MR#:  [id number]  DATE OF BIRTH:  07-Apr-1933  DATE OF ADMISSION:  09/19/2018  PRIMARY CARE PHYSICIAN: Albina Billet, MD   REQUESTING/REFERRING PHYSICIAN: dr Jimmye Norman  CHIEF COMPLAINT:   Fall and passing out HISTORY OF PRESENT ILLNESS:  [name]  is a 85 y.o. male with a known history of hypertension who presents today to the emergency room due to for falls today at home.  Patient reports for the past 3 days he has had dizziness and feelings of lightheadedness.  He denies chest pain, shortness of breath, cough, fever or urinary symptoms.  He was noted to have large amount of bruising on the left side of his forehead and skin tears.  His blood pressure was also noted to be low when EMS arrived.  He was given 250 cc normal saline bolus.   Patient does report since he has been secluded from friends he has decreased appetite.  He continues to take medication for hypertension.  PAST MEDICAL HISTORY:   Past Medical History:  Diagnosis Date  . Arthritis   . Cancer [facility])    Prostate Cancer  . Hypertension     PAST SURGICAL HISTORY:   Past Surgical History:  Procedure Laterality Date  . ESOPHAGOGASTRODUODENOSCOPY (EGD) WITH PROPOFOL N/A 12/06/2017   Procedure: ESOPHAGOGASTRODUODENOSCOPY (EGD) WITH PROPOFOL;  Surgeon: Jonathon Bellows, MD;  [facility];  Service: Gastroenterology;  Laterality: N/A;  . PROSTATE CRYOABLATION      SOCIAL HISTORY:   Social History   Tobacco Use  . Smoking status: Former Smoker    Types: Pipe    Quit date: 03/30/2002    Years since quitting: 16.4  . Smokeless tobacco: Never Used  Substance Use Topics  . Alcohol use: Yes    FAMILY HISTORY:  History reviewed. No pertinent family history.  DRUG ALLERGIES:  No Known Allergies  REVIEW OF SYSTEMS:   Review of Systems  Constitutional: Positive for malaise/fatigue and weight loss. Negative for chills  and fever.  HENT: Negative.  Negative for ear discharge, ear pain, hearing loss, nosebleeds and sore throat.   Eyes: Negative.  Negative for blurred vision and pain.  Respiratory: Negative.  Negative for cough, hemoptysis, shortness of breath and wheezing.   Cardiovascular: Negative.  Negative for chest pain, palpitations and leg swelling.  Gastrointestinal: Negative.  Negative for abdominal pain, blood in stool, diarrhea, nausea and vomiting.  Genitourinary: Negative.  Negative for dysuria.  Musculoskeletal: Positive for falls. Negative for back pain.  Skin: Negative.   Neurological: Positive for dizziness. Negative for tremors, speech change, focal weakness, seizures and headaches.  Endo/Heme/Allergies: Negative.  Does not bruise/bleed easily.  Psychiatric/Behavioral: Negative.  Negative for depression, hallucinations and suicidal ideas.    MEDICATIONS AT HOME:   Prior to Admission medications   Medication Sig Start Date End Date Taking? Authorizing Provider  aspirin 325 MG EC tablet Take 325 mg by mouth every 4 (four) hours as needed for pain (2 tablets every 4 hours during the day for arthritis pain).    [provider]  lisinopril (PRINIVIL,ZESTRIL) 40 MG tablet TAKE 1 TABLET BY MOUTH ONCE DAILY 12/31/14   [provider]      VITAL SIGNS:  Blood pressure (!) 96/55, pulse 98, temperature (!) 103.2 F (39.6 C), temperature source Oral, resp. rate (!) 24, height 6' (1.829 m), weight 61.2 kg, SpO2 98 %.  PHYSICAL EXAMINATION:   Physical  Exam Constitutional:      General: He is not in acute distress. HENT:     Head: Normocephalic.  Eyes:     General: No scleral icterus. Neck:     Musculoskeletal: Normal range of motion and neck supple.     Vascular: No JVD.     Trachea: No tracheal deviation.  Cardiovascular:     Rate and Rhythm: Normal rate and regular rhythm.     Heart sounds: Normal heart sounds. No murmur. No friction rub. No gallop.   Pulmonary:      Effort: Pulmonary effort is normal. No respiratory distress.     Breath sounds: Normal breath sounds. No wheezing or rales.  Chest:     Chest wall: No tenderness.  Abdominal:     General: Bowel sounds are normal. There is no distension.     Palpations: Abdomen is soft. There is no mass.     Tenderness: There is no abdominal tenderness. There is no guarding or rebound.  Musculoskeletal: Normal range of motion.  Skin:    General: Skin is warm.     Coloration: Skin is not pale.     Findings: Bruising and lesion present. No erythema or rash.     Comments: Bruising noted on arms and left forehead.  Multiple excoriations from his fall  Neurological:     Mental Status: He is alert and oriented to person, place, and time.  Psychiatric:        Judgment: Judgment normal.       LABORATORY PANEL:   CBC Recent Labs  Lab 09/19/18 1535  WBC 4.5  HGB 9.9*  HCT 28.5*  PLT 57*   ------------------------------------------------------------------------------------------------------------------  Chemistries  Recent Labs  Lab 09/19/18 1535  NA 136  K 2.5*  CL 105  CO2 20*  GLUCOSE 145*  BUN 46*  CREATININE 1.81*  CALCIUM 7.9*  AST 158*  ALT 74*  ALKPHOS 76  BILITOT 0.9   ------------------------------------------------------------------------------------------------------------------  Cardiac Enzymes Recent Labs  Lab 09/19/18 1535  TROPONINI 0.19*   ------------------------------------------------------------------------------------------------------------------  RADIOLOGY:  Dg Chest 1 View  Result Date: 09/19/2018 CLINICAL DATA:  85 year old male with weakness and fever. COVID-19 status pending. EXAM: CHEST  1 VIEW COMPARISON:  CT Abdomen and Pelvis 01/20/2017. FINDINGS: Portable AP upright view at 1506 hours. Calcified aortic atherosclerosis. Other mediastinal contours are within normal limits. Visualized tracheal air column is within normal limits. Lung volumes are at  the upper limits of normal. No pneumothorax, pleural effusion or pulmonary edema. No confluent pulmonary opacity. External button artifact suspected over the anterior 2nd rib on image #1. Chronic appearing posterior right rib fractures. Negative visible bowel gas pattern. IMPRESSION: No acute cardiopulmonary abnormality. Electronically Signed   By: Genevie Ann M.D.   On: 09/19/2018 15:39   Ct Head Wo Contrast  Result Date: 09/19/2018 CLINICAL DATA:  Pt states he fell and hit his head on the bath tub today. Pt denies LOC EXAM: CT HEAD WITHOUT CONTRAST TECHNIQUE: Contiguous axial images were obtained from the base of the skull through the vertex without intravenous contrast. COMPARISON:  None. FINDINGS: Brain: No evidence of acute infarction, hemorrhage, extra-axial collection, ventriculomegaly, or mass effect. Generalized cerebral atrophy. Periventricular white matter low attenuation likely secondary to microangiopathy. Vascular: Cerebrovascular atherosclerotic calcifications are noted. Skull: Negative for fracture or focal lesion. Sinuses/Orbits: Visualized portions of the orbits are unremarkable. Visualized portions of the paranasal sinuses and mastoid air cells are unremarkable. Other: Large left frontal scalp hematoma. IMPRESSION: No acute intracranial pathology.  Electronically Signed   By: Kathreen Devoid   On: 09/19/2018 15:42    EKG:  Sinus rhythm no ST elevation or depression. There are some ST and T inversion  IMPRESSION AND PLAN:   85 year old male with history of hypertension who has had decreased appetite for several weeks who presents after multiple falls today and syncopal episode.  1.  Syncope with hypotension: This could be purely from dehydration, hypokalemia or cardiac in nature. Continue telemetry monitoring Follow troponins and if they continue to increase then will need cardiology consultation. No anticoagulation due to low platelets. Check echocardiogram Check orthostatics Physical  therapy consultation and case management consultation placed IV fluids started CT head unremarkable 2.  Acute kidney injury in the setting of dehydration: Hold nephrotoxic medications including blood pressure medications and start IV fluids.  BMP in a.m.  3.  Elevated troponin with syncope: Continue telemetry and follow-up on echocardiogram.  Cardiology consultation if needed.  4.  Elevated liver function test likely due to dehydration: Repeat comprehensive metabolic panel in a.m.  5.  Fever: Patient had a fever in the emergency room.  Follow-up on final blood cultures. No antibiotics for now as no clear source. Chest x-ray and UA are unremarkable.  6.  Hypertension: Hold home medication for now due to low blood pressure and dehydration as well as acute kidney injury. Continue to follow  7. HYpokalemia: REplete and chenck MG  All the records are reviewed and case discussed with ED provider. Management plans discussed with the patient and he is in agreement  CODE STATUS: DNR  TOTAL TIME TAKING CARE OF THIS PATIENT: 45 minutes.    Bettey Costa M.D on 09/19/2018 at 6:12 PM  Between 7am to 6pm - Pager - [phone]  After 6pm go to [email or web address] - password [email or web address] [facility]  Office  [phone]  CC: Primary care physician; Albina Billet, MD

## 2018-09-19 NOTE — ED Notes (Signed)
BG 148. Pt leaving for CT.

## 2018-09-19 NOTE — ED Notes (Signed)
ED TO INPATIENT HANDOFF REPORT  ED Nurse Name and Phone #: Anson Crofts Name/Age/Gender [name] 85 y.o. male Room/Bed: ED08A/ED08A  Code Status   Code Status: DNR  Home/SNF/Other Home Patient oriented to: self, place, time and situation Is this baseline? Yes   Triage Complete: Triage complete  Chief Complaint Fall  Triage Note Pt in via [facility] from home d/t fall x4; pt reports dizziness and "blacking out" before these falls. Pt has bruising to forehead and skin tears on arms. Pt received 250cc NS bolus before EMS arrived then EMS started 500cc bolus per EMS. Pt history of HTN and pt takes lisinopril. Pt's VSS with EMS. BG 183 per EMS.    Allergies No Known Allergies  Level of Care/Admitting Diagnosis ED Disposition    ED Disposition Condition [facility]: [facility] [100120]  Level of Care: Telemetry [5]  Covid Evaluation: N/A  Diagnosis: Syncope [299371]  Admitting Physician: Bettey Costa [[id number]]  Attending Physician: MODY, Ulice Bold [[id number]]  Estimated length of stay: past midnight tomorrow  Certification:: I certify this patient will need inpatient services for at least 2 midnights  PT Class (Do Not Modify): Inpatient [101]  PT Acc Code (Do Not Modify): Private [1]       B Medical/Surgery History Past Medical History:  Diagnosis Date  . Arthritis   . Cancer [facility])    Prostate Cancer  . Hypertension    Past Surgical History:  Procedure Laterality Date  . ESOPHAGOGASTRODUODENOSCOPY (EGD) WITH PROPOFOL N/A 12/06/2017   Procedure: ESOPHAGOGASTRODUODENOSCOPY (EGD) WITH PROPOFOL;  Surgeon: Jonathon Bellows, MD;  Location: [facility] ENDOSCOPY;  Service: Gastroenterology;  Laterality: N/A;  . PROSTATE CRYOABLATION       A IV Location/Drains/Wounds Patient Lines/Drains/Airways Status   Active Line/Drains/Airways    Name:   Placement date:   Placement time:   Site:   Days:   Peripheral IV 09/19/18 Right Antecubital   09/19/18     1552    Antecubital   less than 1   Peripheral IV 09/19/18 Left Arm   09/19/18    1522    Arm   less than 1          Intake/Output Last 24 hours No intake or output data in the 24 hours ending 09/19/18 2148  Labs/Imaging Results for orders placed or performed during the hospital encounter of 09/19/18 (from the past 48 hour(s))  Glucose, capillary     Status: Abnormal   Collection Time: 09/19/18  3:20 PM  Result Value Ref Range   Glucose-Capillary 148 (H) 70 - 99 mg/dL  CBC with Differential     Status: Abnormal   Collection Time: 09/19/18  3:35 PM  Result Value Ref Range   WBC 4.5 4.0 - 10.5 K/uL   RBC 3.31 (L) 4.22 - 5.81 MIL/uL   Hemoglobin 9.9 (L) 13.0 - 17.0 g/dL   HCT 28.5 (L) 39.0 - 52.0 %   MCV 86.1 80.0 - 100.0 fL   MCH 29.9 26.0 - 34.0 pg   MCHC 34.7 30.0 - 36.0 g/dL   RDW 13.0 11.5 - 15.5 %   Platelets 57 (L) 150 - 400 K/uL    Comment: Immature Platelet Fraction may be clinically indicated, consider ordering this additional test [id number]    nRBC 0.0 0.0 - 0.2 %   Neutrophils Relative % 92 %   Neutro Abs 4.1 1.7 - 7.7 K/uL   Lymphocytes Relative 5 %   Lymphs Abs 0.2 (  L) 0.7 - 4.0 K/uL   Monocytes Relative 3 %   Monocytes Absolute 0.1 0.1 - 1.0 K/uL   Eosinophils Relative 0 %   Eosinophils Absolute 0.0 0.0 - 0.5 K/uL   Basophils Relative 0 %   Basophils Absolute 0.0 0.0 - 0.1 K/uL   Smear Review Normal platelet morphology    Abs Immature Granulocytes 0.00 0.00 - 0.07 K/uL   Abnormal Lymphocytes Present PRESENT    Bite Cells PRESENT     Comment: Performed at [facility], [address] [place], [place] [address]  Comprehensive metabolic panel     Status: Abnormal   Collection Time: 09/19/18  3:35 PM  Result Value Ref Range   Sodium 136 135 - 145 mmol/L   Potassium 2.5 (LL) 3.5 - 5.1 mmol/L    Comment: CRITICAL RESULT CALLED TO, READ BACK BY AND VERIFIED WITH JESSICA FULCHER @1707  09/19/18 MJU    Chloride 105 98 - 111 mmol/L   CO2 20 (L) 22 -  32 mmol/L   Glucose, Bld 145 (H) 70 - 99 mg/dL   BUN 46 (H) 8 - 23 mg/dL   Creatinine, Ser 1.81 (H) 0.61 - 1.24 mg/dL   Calcium 7.9 (L) 8.9 - 10.3 mg/dL   Total Protein 5.5 (L) 6.5 - 8.1 g/dL   Albumin 3.0 (L) 3.5 - 5.0 g/dL   AST 158 (H) 15 - 41 U/L   ALT 74 (H) 0 - 44 U/L   Alkaline Phosphatase 76 38 - 126 U/L   Total Bilirubin 0.9 0.3 - 1.2 mg/dL   GFR calc non Af Amer 34 (L) >60 mL/min   GFR calc Af Amer 39 (L) >60 mL/min   Anion gap 11 5 - 15    Comment: Performed at [facility], [address] [place], [place] [address]  Urinalysis, Complete w Microscopic     Status: Abnormal   Collection Time: 09/19/18  3:35 PM  Result Value Ref Range   Color, Urine YELLOW (A) YELLOW   APPearance CLOUDY (A) CLEAR   Specific Gravity, Urine 1.010 1.005 - 1.030   pH 5.0 5.0 - 8.0   Glucose, UA NEGATIVE NEGATIVE mg/dL   Hgb urine dipstick LARGE (A) NEGATIVE   Bilirubin Urine NEGATIVE NEGATIVE   Ketones, ur NEGATIVE NEGATIVE mg/dL   Protein, ur 30 (A) NEGATIVE mg/dL   Nitrite NEGATIVE NEGATIVE   Leukocytes,Ua NEGATIVE NEGATIVE   RBC / HPF >50 (H) 0 - 5 RBC/hpf   WBC, UA 0-5 0 - 5 WBC/hpf   Bacteria, UA RARE (A) NONE SEEN   Squamous Epithelial / LPF 0-5 0 - 5   Mucus PRESENT     Comment: Performed at [facility], [address] [place], [place] [address]  Troponin I - ONCE - STAT     Status: Abnormal   Collection Time: 09/19/18  3:35 PM  Result Value Ref Range   Troponin I 0.19 (HH) <0.03 ng/mL    Comment: CRITICAL RESULT CALLED TO, READ BACK BY AND VERIFIED WITH JESSICA FULCHER @1707  09/19/18 MJU Performed at [facility], [address] [place], [place] [address]   Lactic acid, plasma     Status: None   Collection Time: 09/19/18  3:36 PM  Result Value Ref Range   Lactic Acid, Venous 1.8 0.5 - 1.9 mmol/L    Comment: Performed at [facility], [address], [place], [place] [address]  SARS Coronavirus 2 (CEPHEID- Performed in [facility]  hospital lab), [facility]  Status: None   Collection Time: 09/19/18  3:36 PM   Specimen: Nasopharyngeal Swab  Result Value Ref Range   SARS Coronavirus 2 NEGATIVE NEGATIVE    Comment: (NOTE) If result is NEGATIVE SARS-CoV-2 target nucleic acids are NOT DETECTED. The SARS-CoV-2 RNA is generally detectable in upper and lower  respiratory specimens during the acute phase of infection. The lowest  concentration of SARS-CoV-2 viral copies this assay can detect is 250  copies / mL. A negative result does not preclude SARS-CoV-2 infection  and should not be used as the sole basis for treatment or other  patient management decisions.  A negative result may occur with  improper specimen collection / handling, submission of specimen other  than nasopharyngeal swab, presence of viral mutation(s) within the  areas targeted by this assay, and inadequate number of viral copies  (<250 copies / mL). A negative result must be combined with clinical  observations, patient history, and epidemiological information. If result is POSITIVE SARS-CoV-2 target nucleic acids are DETECTED. The SARS-CoV-2 RNA is generally detectable in upper and lower  respiratory specimens dur ing the acute phase of infection.  Positive  results are indicative of active infection with SARS-CoV-2.  Clinical  correlation with patient history and other diagnostic information is  necessary to determine patient infection status.  Positive results do  not rule out bacterial infection or co-infection with other viruses. If result is PRESUMPTIVE POSTIVE SARS-CoV-2 nucleic acids MAY BE PRESENT.   A presumptive positive result was obtained on the submitted specimen  and confirmed on repeat testing.  While 2019 novel coronavirus  (SARS-CoV-2) nucleic acids may be present in the submitted sample  additional confirmatory testing may be necessary for epidemiological  and / or clinical management purposes  to differentiate between   SARS-CoV-2 and other Sarbecovirus currently known to infect humans.  If clinically indicated additional testing with an alternate test  methodology [phone]) is advised. The SARS-CoV-2 RNA is generally  detectable in upper and lower respiratory sp ecimens during the acute  phase of infection. The expected result is Negative. Fact Sheet for Patients:  StrictlyIdeas.no Fact Sheet for Healthcare Providers: BankingDealers.co.za This test is not yet approved or cleared by the [place] FDA and has been authorized for detection and/or diagnosis of SARS-CoV-2 by FDA under an Emergency Use Authorization (EUA).  This EUA will remain in effect (meaning this test can be used) for the duration of the COVID-19 declaration under Section 564(b)(1) of the Act, 21 U.S.C. section 360bbb-3(b)(1), unless the authorization is terminated or revoked sooner. Performed at [facility], [address] [place], [place] [address]   Troponin I - Now Then Q6H     Status: Abnormal   Collection Time: 09/19/18  6:15 PM  Result Value Ref Range   Troponin I 0.17 (HH) <0.03 ng/mL    Comment: CRITICAL VALUE NOTED. VALUE IS CONSISTENT WITH PREVIOUSLY REPORTED/CALLED VALUE MJU Performed at [facility], [address] [place], [place] [address]    Dg Chest 1 View  Result Date: 09/19/2018 CLINICAL DATA:  85 year old male with weakness and fever. COVID-19 status pending. EXAM: CHEST  1 VIEW COMPARISON:  CT Abdomen and Pelvis 01/20/2017. FINDINGS: Portable AP upright view at 1506 hours. Calcified aortic atherosclerosis. Other mediastinal contours are within normal limits. Visualized tracheal air column is within normal limits. Lung volumes are at the upper limits of normal. No pneumothorax, pleural effusion or pulmonary edema. No confluent pulmonary opacity. External button artifact suspected over the anterior  2nd rib on image #1. Chronic appearing  posterior right rib fractures. Negative visible bowel gas pattern. IMPRESSION: No acute cardiopulmonary abnormality. Electronically Signed   By: Genevie Ann M.D.   On: 09/19/2018 15:39   Ct Head Wo Contrast  Result Date: 09/19/2018 CLINICAL DATA:  Pt states he fell and hit his head on the bath tub today. Pt denies LOC EXAM: CT HEAD WITHOUT CONTRAST TECHNIQUE: Contiguous axial images were obtained from the base of the skull through the vertex without intravenous contrast. COMPARISON:  None. FINDINGS: Brain: No evidence of acute infarction, hemorrhage, extra-axial collection, ventriculomegaly, or mass effect. Generalized cerebral atrophy. Periventricular white matter low attenuation likely secondary to microangiopathy. Vascular: Cerebrovascular atherosclerotic calcifications are noted. Skull: Negative for fracture or focal lesion. Sinuses/Orbits: Visualized portions of the orbits are unremarkable. Visualized portions of the paranasal sinuses and mastoid air cells are unremarkable. Other: Large left frontal scalp hematoma. IMPRESSION: No acute intracranial pathology. Electronically Signed   By: Kathreen Devoid   On: 09/19/2018 15:42    Pending Labs Unresulted Labs (From admission, onward)    Start     Ordered   09/20/18 0370  Basic metabolic panel  Tomorrow morning,   STAT     09/19/18 1814   09/20/18 0500  Magnesium  Tomorrow morning,   STAT     09/19/18 1814   09/20/18 0500  Phosphorus  Tomorrow morning,   STAT     09/19/18 1814   09/19/18 2200  Potassium  Once-Timed,   STAT     09/19/18 1813   09/19/18 1748  Troponin I - Now Then Q6H  Now then every 6 hours,   STAT     09/19/18 1748   09/19/18 1509  Blood culture (routine x 2)  BLOOD CULTURE X 2,   STAT    Question:  Patient immune status  Answer:  Normal   09/19/18 1509   09/19/18 1509  Urine Culture  Add-on,   AD    Question:  Patient immune status  Answer:  Normal   09/19/18 1509   Signed and Held  Basic metabolic panel  Tomorrow morning,   R      Signed and Held          Vitals/Pain Today's Vitals   09/19/18 1930 09/19/18 2000 09/19/18 2113 09/19/18 2122  BP: 108/71 131/82  115/77  Pulse:    76  Resp: (!) 26 (!) 26  17  Temp:    99 F (37.2 C)  TempSrc:    Oral  SpO2:    98%  Weight:      Height:      PainSc:   0-No pain     Isolation Precautions No active isolations  Medications Medications  acetaminophen (TYLENOL) 500 MG tablet (  Not Given 09/19/18 1613)  dextrose 5 % and 0.45 % NaCl with KCl 40 mEq/L infusion ( Intravenous New Bag/Given 09/19/18 1752)  0.9 %  sodium chloride infusion ( Intravenous Stopped 09/19/18 1700)  acetaminophen (TYLENOL) tablet 1,000 mg (1,000 mg Oral Given 09/19/18 1554)  aspirin chewable tablet 324 mg (324 mg Oral Given 09/19/18 1732)  potassium chloride SA (K-DUR) CR tablet 40 mEq (40 mEq Oral Given 09/19/18 1732)    Mobility walks with device High fall risk   Focused Assessments Cardiac Assessment Handoff:    Lab Results  Component Value Date   TROPONINI 0.17 ([place]) 09/19/2018   No results found for: DDIMER Does the Patient currently have chest pain? No  R Recommendations: See Admitting Provider Note  Report given to:   Additional Notes:

## 2018-09-19 NOTE — ED Notes (Signed)
Pt leaving for CT post BG check.

## 2018-09-19 NOTE — ED Triage Notes (Signed)
Pt in via [facility] from home d/t fall x4; pt reports dizziness and "blacking out" before these falls. Pt has bruising to forehead and skin tears on arms. Pt received 250cc NS bolus before EMS arrived then EMS started 500cc bolus per EMS. Pt history of HTN and pt takes lisinopril. Pt's VSS with EMS. BG 183 per EMS.

## 2018-09-19 NOTE — Progress Notes (Signed)
Family Meeting Note  Advance Directive:no  Today a meeting took place with the Patient.   The following clinical team members were present during this meeting:MD  The following were discussed:Patient's diagnosis:syncope Elevated troponin with hypokalemia and acute kidney injury Hypotension , Patient's progosis: Unable to determine and Goals for treatment: DNR  Additional follow-up to be provided: Outpatient palliative care services Chaplain consultation to create advanced directives  Time spent during discussion:16 minutes  Bettey Costa, MD

## 2018-09-19 NOTE — ED Notes (Signed)
This EDT offered pt a meal tray, pt refused.

## 2018-09-19 NOTE — ED Provider Notes (Signed)
[facility] Emergency Department Provider Note       Time seen: ----------------------------------------- 3:09 PM on 09/19/2018 -----------------------------------------   I have reviewed the triage vital signs and the nursing notes.  HISTORY   Chief Complaint Fall    HPI [name] is a 85 y.o. male with a history of arthritis, prostate cancer, hypertension who presents to the ED for falls at home.  Patient reportedly arrives by EMS from home after 4 falls today.  He reports dizziness and blacking out before the fall at least once.  He was noted to have a large amount of bruising on his forehead and skin tears on his right arm.  He was given 250 cc of normal saline bolus by EMS.  Patient has a history of hypertension and takes lisinopril.  Patient denies fevers, chills, chest pain, shortness of breath, vomiting or diarrhea.  Past Medical History:  Diagnosis Date  . Arthritis   . Cancer [facility])    Prostate Cancer  . Hypertension     There are no active problems to display for this patient.   Past Surgical History:  Procedure Laterality Date  . ESOPHAGOGASTRODUODENOSCOPY (EGD) WITH PROPOFOL N/A 12/06/2017   Procedure: ESOPHAGOGASTRODUODENOSCOPY (EGD) WITH PROPOFOL;  Surgeon: Jonathon Bellows, MD;  Location: [facility] ENDOSCOPY;  Service: Gastroenterology;  Laterality: N/A;  . PROSTATE CRYOABLATION      Allergies Patient has no known allergies.  Social History Social History   Tobacco Use  . Smoking status: Former Smoker    Types: Pipe    Quit date: 03/30/2002    Years since quitting: 16.4  . Smokeless tobacco: Never Used  Substance Use Topics  . Alcohol use: Not on file  . Drug use: Never   Review of Systems Constitutional: Negative for fever. Cardiovascular: Negative for chest pain. Respiratory: Negative for shortness of breath. Gastrointestinal: Negative for abdominal pain, vomiting and diarrhea. Musculoskeletal: Negative for back  pain. Skin: Negative for rash. Neurological: Positive for weakness, frequent falls  All systems negative/normal/unremarkable except as stated in the HPI  ____________________________________________   PHYSICAL EXAM:  VITAL SIGNS: ED Triage Vitals  Enc Vitals Group     BP      Pulse      Resp      Temp      Temp src      SpO2      Weight      Height      Head Circumference      Peak Flow      Pain Score      Pain Loc      Pain Edu?      Excl. in [place]?    Constitutional: Alert and oriented.  Mild distress Eyes: Conjunctivae are normal. Normal extraocular movements.  Peoples are equal round and reactive to light ENT      Head: Normocephalic, large left-sided frontal hematoma      Nose: No congestion/rhinnorhea.      Mouth/Throat: Mucous membranes are moist.      Neck: No stridor. Cardiovascular: Rapid rate, regular rhythm. No murmurs, rubs, or gallops. Respiratory: Normal respiratory effort without tachypnea nor retractions. Breath sounds are clear and equal bilaterally. No wheezes/rales/rhonchi. Gastrointestinal: Soft and nontender. Normal bowel sounds Musculoskeletal: Nontender with normal range of motion in extremities. No lower extremity tenderness nor edema. Neurologic:  Normal speech and language. No gross focal neurologic deficits are appreciated.  Skin: Skin tears and abrasions are noted over the right upper extremity Psychiatric: Mood  and affect are normal.  ____________________________________________  EKG: Interpreted by me.  Sinus rhythm with rate of 98 bpm, ST and T wave abnormalities concerning for inferior and lateral ischemia, normal axis.  ____________________________________________  ED COURSE:  As part of my medical decision making, I reviewed the following data within the [place] History obtained from family if available, nursing notes, old chart and ekg, as well as notes from prior ED visits. Patient presented for frequent falls  with syncope and head injury, we will assess with labs and imaging as indicated at this time.  Patient was also found to be febrile on arrival.  We will initiate sepsis protocols   Procedures  [name] was evaluated in Emergency Department on 09/19/2018 for the symptoms described in the history of present illness. He was evaluated in the context of the global COVID-19 pandemic, which necessitated consideration that the patient might be at risk for infection with the SARS-CoV-2 virus that causes COVID-19. Institutional protocols and algorithms that pertain to the evaluation of patients at risk for COVID-19 are in a state of rapid change based on information released by regulatory bodies including the CDC and federal and state organizations. These policies and algorithms were followed during the patient's care in the ED.  ____________________________________________   LABS (pertinent positives/negatives)  Labs Reviewed  CBC WITH DIFFERENTIAL/PLATELET - Abnormal; Notable for the following components:      Result Value   RBC 3.31 (*)    Hemoglobin 9.9 (*)    HCT 28.5 (*)    Platelets 57 (*)    Lymphs Abs 0.2 (*)    All other components within normal limits  COMPREHENSIVE METABOLIC PANEL - Abnormal; Notable for the following components:   Potassium 2.5 (*)    CO2 20 (*)    Glucose, Bld 145 (*)    BUN 46 (*)    Creatinine, Ser 1.81 (*)    Calcium 7.9 (*)    Total Protein 5.5 (*)    Albumin 3.0 (*)    AST 158 (*)    ALT 74 (*)    GFR calc non Af Amer 34 (*)    GFR calc Af Amer 39 (*)    All other components within normal limits  TROPONIN I - Abnormal; Notable for the following components:   Troponin I 0.19 (*)    All other components within normal limits  GLUCOSE, CAPILLARY - Abnormal; Notable for the following components:   Glucose-Capillary 148 (*)    All other components within normal limits  CULTURE, BLOOD (ROUTINE X 2)  CULTURE, BLOOD (ROUTINE X 2)  URINE CULTURE  SARS  CORONAVIRUS 2 (HOSPITAL ORDER, [place] [facility])  LACTIC ACID, PLASMA  URINALYSIS, COMPLETE (UACMP) WITH MICROSCOPIC  CBG MONITORING, ED   CRITICAL CARE Performed by: Laurence Aly   Total critical care time: 30 minutes  Critical care time was exclusive of separately billable procedures and treating other patients.  Critical care was necessary to treat or prevent imminent or life-threatening deterioration.  Critical care was time spent personally by me on the following activities: development of treatment plan with patient and/or surrogate as well as nursing, discussions with consultants, evaluation of patient's response to treatment, examination of patient, obtaining history from patient or surrogate, ordering and performing treatments and interventions, ordering and review of laboratory studies, ordering and review of radiographic studies, pulse oximetry and re-evaluation of patient's condition.  RADIOLOGY Images were viewed by me  CT head,  chest x-ray Did not reveal any acute process ____________________________________________   DIFFERENTIAL DIAGNOSIS   Sepsis, dehydration, electrolyte abnormality, arrhythmia, coronavirus, subdural hematoma, skull fracture, pneumonia  FINAL ASSESSMENT AND PLAN  Syncope, head injury, elevated troponin, hypokalemia, anemia   Plan: The patient had presented for recurrent episodes of falling today with at least one episode of syncope. Patient's labs revealed numerous abnormalities including anemia, hypokalemia, renal insufficiency, & elevated liver function tests. Patient's imaging did not reveal any acute process.  He has received IV fluids including IV fluids containing potassium.  We have given him aspirin for his elevated troponin.  No clear etiology for his fever yet.   Laurence Aly, MD    Note: This note was generated in part or whole with voice recognition software. Voice recognition is usually quite  accurate but there are transcription errors that can and very often do occur. I apologize for any typographical errors that were not detected and corrected.     Earleen Newport, MD 09/20/18 [phone]

## 2018-09-19 NOTE — ED Notes (Signed)
EKG completed

## 2018-09-19 NOTE — Consult Note (Signed)
PHARMACY CONSULT NOTE - FOLLOW UP  Pharmacy Consult for Electrolyte Monitoring and Replacement   Recent Labs: Potassium (mmol/L)  Date Value  09/19/2018 2.5 (LL)   Calcium (mg/dL)  Date Value  09/19/2018 7.9 (L)   Albumin (g/dL)  Date Value  09/19/2018 3.0 (L)   Sodium (mmol/L)  Date Value  09/19/2018 136   Corrected Ca: 8.7 Scr 1.81  Assessment: Patient present hypokalemic.   Medications:  Continuous: D5W 1/2 NS + 40 mEq KCL   Oral: KCL 40 mEq x1 dose (last given at 1732)  Goal of Therapy:  Electrolytes WNL  Plan:  Continue D5W 1/2 NS + 40 mEq KCL.  Will recheck potassium at 2200.   Will follow electrolytes with AM labs.   Rowland Lathe ,PharmD Clinical Pharmacist 09/19/2018 6:06 PM

## 2018-09-20 ENCOUNTER — Inpatient Hospital Stay: Payer: Medicare Other

## 2018-09-20 ENCOUNTER — Inpatient Hospital Stay
Admit: 2018-09-20 | Discharge: 2018-09-20 | Disposition: A | Discharge status: Home / Self Care | Payer: Medicare Other | Attending: Internal Medicine | Admitting: Internal Medicine

## 2018-09-20 DIAGNOSIS — K921 Melena: Secondary | ICD-10-CM

## 2018-09-20 DIAGNOSIS — R7401 Elevation of levels of liver transaminase levels: Secondary | ICD-10-CM

## 2018-09-20 LAB — BASIC METABOLIC PANEL
Anion gap: 13 (ref 5–15)
BUN: 40 mg/dL — ABNORMAL HIGH (ref 8–23)
CO2: 19 mmol/L — ABNORMAL LOW (ref 22–32)
Calcium: 7.9 mg/dL — ABNORMAL LOW (ref 8.9–10.3)
Chloride: 108 mmol/L (ref 98–111)
Creatinine, Ser: 1.32 mg/dL — ABNORMAL HIGH (ref 0.61–1.24)
GFR calc Af Amer: 58 mL/min — ABNORMAL LOW (ref >60)
GFR calc non Af Amer: 50 mL/min — ABNORMAL LOW (ref >60)
Glucose, Bld: 105 mg/dL — ABNORMAL HIGH (ref 70–99)
Potassium: 3.3 mmol/L — ABNORMAL LOW (ref 3.5–5.1)
Sodium: 140 mmol/L (ref 135–145)

## 2018-09-20 LAB — TROPONIN I
Troponin I: 0.17 ng/mL (ref <0.03)
Troponin I: 0.15 ng/mL (ref <0.03)

## 2018-09-20 LAB — URINE CULTURE
Culture: NO GROWTH
Special Requests: NORMAL

## 2018-09-20 LAB — BLOOD CULTURE ID PANEL (REFLEXED)

## 2018-09-20 LAB — GLUCOSE, CAPILLARY
Glucose-Capillary: 99 mg/dL (ref 70–99)
Glucose-Capillary: 67 mg/dL — ABNORMAL LOW (ref 70–99)

## 2018-09-20 LAB — PHOSPHORUS: Phosphorus: 2.3 mg/dL — ABNORMAL LOW (ref 2.5–4.6)

## 2018-09-20 LAB — OCCULT BLOOD X 1 CARD TO LAB, STOOL: Fecal Occult Bld: POSITIVE — AB

## 2018-09-20 LAB — MAGNESIUM: Magnesium: 1.9 mg/dL (ref 1.7–2.4)

## 2018-09-20 LAB — HEMOGLOBIN: Hemoglobin: 10 g/dL — ABNORMAL LOW (ref 13.0–17.0)

## 2018-09-20 MED ORDER — ENSURE ENLIVE PO LIQD
237.0000 mL | Freq: Three times a day (TID) | ORAL | Status: DC
  Administered 2018-09-20 – 2018-09-29 (×3): 237 mL via ORAL

## 2018-09-20 MED ORDER — POTASSIUM CHLORIDE 20 MEQ PO PACK
40.0000 meq | PACK | Freq: Once | ORAL | Status: AC
  Administered 2018-09-20: 40 meq via ORAL
  Filled 2018-09-20: qty 2

## 2018-09-20 MED ORDER — K PHOS MONO-SOD PHOS DI & MONO 155-852-130 MG PO TABS
500.0000 mg | ORAL_TABLET | ORAL | Status: AC
  Administered 2018-09-20: 500 mg via ORAL
  Filled 2018-09-20 (×2): qty 2

## 2018-09-20 MED ORDER — ADULT MULTIVITAMIN W/MINERALS CH
1.0000 | ORAL_TABLET | Freq: Every day | ORAL | Status: DC
  Administered 2018-09-21 – 2018-10-10 (×9): 1 via ORAL
  Filled 2018-09-20 (×8): qty 1

## 2018-09-20 MED ORDER — POTASSIUM CHLORIDE CRYS ER 20 MEQ PO TBCR
40.0000 meq | EXTENDED_RELEASE_TABLET | Freq: Once | ORAL | Status: AC
  Administered 2018-09-20: 40 meq via ORAL
  Filled 2018-09-20: qty 2

## 2018-09-20 MED ORDER — VITAMIN C 500 MG PO TABS
250.0000 mg | ORAL_TABLET | Freq: Two times a day (BID) | ORAL | Status: DC
  Administered 2018-09-20 – 2018-09-22 (×4): 250 mg via ORAL
  Filled 2018-09-20 (×6): qty 1

## 2018-09-20 MED ORDER — SODIUM CHLORIDE 0.9 % IV SOLN
2.0000 g | INTRAVENOUS | Status: DC
  Administered 2018-09-20 – 2018-09-21 (×2): 2 g via INTRAVENOUS
  Filled 2018-09-20 (×2): qty 2

## 2018-09-20 MED ORDER — ACETAMINOPHEN 325 MG PO TABS
650.0000 mg | ORAL_TABLET | Freq: Once | ORAL | Status: AC
  Administered 2018-09-20: 650 mg via ORAL

## 2018-09-20 NOTE — Progress Notes (Signed)
Hemoccult stool came back positive. Dr called no new orders given, will continue to monitor.

## 2018-09-20 NOTE — Consult Note (Addendum)
Lucilla Lame, MD [facility]  [address], [place], [place] [address] Phone: [phone] Fax : [phone]  Consultation  Referring Provider:     Dr. Posey Pronto Primary Care Physician:  [name], MD Primary Gastroenterologist:  Dr. Vicente Males         Reason for Consultation:     Anemia  Date of Admission:  09/19/2018 Date of Consultation:  09/20/2018         HPI:   [name] is a 85 y.o. male who has a history of having an upper endoscopy back in November of last year with no sign of cancer ulcers or inflammation. The patient was admitted yesterday with a report of 3 days of dizziness and feeling lightheaded.  The patient was given a saline bolus by EMS.  The patient states he has had a colonoscopy in the past but cannot remember when it was and states he thinks he was many years ago. On admission the patient's hemoglobin is 9.9 with a repeat hemoglobin of 10.0 today.  He did have occult positive stools.  The patient also has significant thrombocytopenia at 57. I am now being asked to see the patient for his anemia. The patient was also noted to have elevated liver enzymes which have been normal in the past.  Past Medical History:  Diagnosis Date  . Arthritis   . Cancer [facility])    Prostate Cancer  . Hypertension     Past Surgical History:  Procedure Laterality Date  . ESOPHAGOGASTRODUODENOSCOPY (EGD) WITH PROPOFOL N/A 12/06/2017   Procedure: ESOPHAGOGASTRODUODENOSCOPY (EGD) WITH PROPOFOL;  Surgeon: Jonathon Bellows, MD;  Location: [facility] ENDOSCOPY;  Service: Gastroenterology;  Laterality: N/A;  . PROSTATE CRYOABLATION      Prior to Admission medications   Medication Sig Start Date End Date Taking? Authorizing Provider  aspirin 325 MG EC tablet Take 325 mg by mouth every 4 (four) hours as needed for pain (2 tablets every 4 hours during the day for arthritis pain).   Yes [provider]  Calcium Carbonate-Vitamin D (CALCIUM 600+D) 600-400 MG-UNIT tablet Take 1 tablet  by mouth 2 (two) times a day.   Yes [provider]  lisinopril (PRINIVIL,ZESTRIL) 40 MG tablet TAKE 1 TABLET BY MOUTH ONCE DAILY 12/31/14  Yes [provider]  Misc Natural Products (OSTEO BI-FLEX ADV JOINT SHIELD) TABS Take 2 tablets by mouth daily.   Yes [provider]  vitamin B-12 (CYANOCOBALAMIN) 500 MCG tablet Take 1 tablet by mouth daily.   Yes [provider]    History reviewed. No pertinent family history.   Social History   Tobacco Use  . Smoking status: Former Smoker    Types: Pipe    Quit date: 03/30/2002    Years since quitting: 16.4  . Smokeless tobacco: Never Used  Substance Use Topics  . Alcohol use: Yes  . Drug use: Never    Allergies as of 09/19/2018  . (No Known Allergies)    Review of Systems:    All systems reviewed and negative except where noted in HPI.   Physical Exam:  Vital signs in last 24 hours: Temp:  [98.3 F (36.8 C)-103.2 F (39.6 C)] 98.4 F (36.9 C) (06/23 0754) Pulse Rate:  [76-104] 79 (06/23 0754) Resp:  [16-35] 16 (06/23 0754) BP: (96-175)/(55-91) 111/71 (06/23 0754) SpO2:  [90 %-100 %] 100 % (06/23 0754) Weight:  [60.5 kg-61.2 kg] 60.5 kg (06/22 2317) Last BM Date: 09/19/18 General:   Pleasant, cooperative in NAD  Head:  Normocephalic and multiple contusions on his arm and head with ecchymosis on his left side of his head. Eyes:   No icterus.   Conjunctiva pink. PERRLA. Ears:  Normal auditory acuity. Neck:  Supple; no masses or thyroidomegaly Lungs: Respirations even and unlabored. Lungs clear to auscultation bilaterally.   No wheezes, crackles, or rhonchi.  Heart:  Regular rate and rhythm;  Without murmur, clicks, rubs or gallops Abdomen:  Soft, nondistended, nontender. Normal bowel sounds. No appreciable masses or hepatomegaly.  No rebound or guarding.  Rectal:  Not performed. Msk:  Symmetrical without gross deformities.   Extremities:  Without edema, cyanosis or clubbing. Neurologic:  Alert  and oriented x3;  grossly normal neurologically. Skin: Notable for lesions and abrasions on his arms bilaterally. Cervical Nodes:  No significant cervical adenopathy. Psych:  Alert and cooperative. Normal affect.  LAB RESULTS: Recent Labs    09/19/18 1535 09/20/18 0533  WBC 4.5  --   HGB 9.9* 10.0*  HCT 28.5*  --   PLT 57*  --    BMET Recent Labs    09/19/18 1535 09/19/18 2325 09/20/18 0533  NA 136  --  140  K 2.5* 3.1* 3.3*  CL 105  --  108  CO2 20*  --  19*  GLUCOSE 145*  --  105*  BUN 46*  --  40*  CREATININE 1.81*  --  1.32*  CALCIUM 7.9*  --  7.9*   LFT Recent Labs    09/19/18 1535  PROT 5.5*  ALBUMIN 3.0*  AST 158*  ALT 74*  ALKPHOS 76  BILITOT 0.9   PT/INR No results for input(s): LABPROT, INR in the last 72 hours.  STUDIES: Dg Chest 1 View  Result Date: 09/19/2018 CLINICAL DATA:  85 year old male with weakness and fever. COVID-19 status pending. EXAM: CHEST  1 VIEW COMPARISON:  CT Abdomen and Pelvis 01/20/2017. FINDINGS: Portable AP upright view at 1506 hours. Calcified aortic atherosclerosis. Other mediastinal contours are within normal limits. Visualized tracheal air column is within normal limits. Lung volumes are at the upper limits of normal. No pneumothorax, pleural effusion or pulmonary edema. No confluent pulmonary opacity. External button artifact suspected over the anterior 2nd rib on image #1. Chronic appearing posterior right rib fractures. Negative visible bowel gas pattern. IMPRESSION: No acute cardiopulmonary abnormality. Electronically Signed   By: Genevie Ann M.D.   On: 09/19/2018 15:39   Dg Pelvis 1-2 Views  Result Date: 09/20/2018 CLINICAL DATA:  Multiple falls.  Bilateral hip pain and weakness. EXAM: PELVIS - 1-2 VIEW COMPARISON:  CT 01/20/2017. FINDINGS: Diffuse osteopenia. Degenerative change both hips. No evidence of fracture or dislocation. A stable tiny sclerotic density noted in the left pubis consistent with a bone island. Pelvic  calcifications consistent phleboliths. Peripheral vascular calcification. IMPRESSION: 1. Diffuse osteopenia. Degenerative change both hips. No acute bony abnormality. 2.  Peripheral vascular disease. Electronically Signed   By: Marcello Moores  Register   On: 09/20/2018 09:56   Ct Head Wo Contrast  Result Date: 09/19/2018 CLINICAL DATA:  Pt states he fell and hit his head on the bath tub today. Pt denies LOC EXAM: CT HEAD WITHOUT CONTRAST TECHNIQUE: Contiguous axial images were obtained from the base of the skull through the vertex without intravenous contrast. COMPARISON:  None. FINDINGS: Brain: No evidence of acute infarction, hemorrhage, extra-axial collection, ventriculomegaly, or mass effect. Generalized cerebral atrophy. Periventricular white matter low attenuation likely secondary to microangiopathy. Vascular: Cerebrovascular atherosclerotic calcifications are noted. Skull: Negative for fracture or  focal lesion. Sinuses/Orbits: Visualized portions of the orbits are unremarkable. Visualized portions of the paranasal sinuses and mastoid air cells are unremarkable. Other: Large left frontal scalp hematoma. IMPRESSION: No acute intracranial pathology. Electronically Signed   By: Kathreen Devoid   On: 09/19/2018 15:42   US Abdomen Complete  Result Date: 09/20/2018 CLINICAL DATA:  Elevated liver function tests. EXAM: ABDOMEN ULTRASOUND COMPLETE COMPARISON:  CT abdomen and pelvis 01/20/2017. FINDINGS: Gallbladder: No gallstones or wall thickening visualized. No sonographic Murphy sign noted by sonographer. Common bile duct: Diameter: 0.2 cm Liver: No focal lesion identified. Within normal limits in parenchymal echogenicity. Portal vein is patent on color Doppler imaging with normal direction of blood flow towards the liver. IVC: No abnormality visualized. Pancreas: Visualized portion unremarkable. Spleen: Size and appearance within normal limits. A few small calcifications in the spleen consistent with old granulomatous  disease are seen as on prior CT. Right Kidney: Length: 12.5 cm. Echogenicity within normal limits. No solid mass or hydronephrosis visualized. 7.4 cm in diameter cyst is identified as seen on prior CT. Calcification in the wall of this cyst is unchanged. Left Kidney: Length: 12.5 cm. Echogenicity within normal limits. No solid mass or hydronephrosis visualized. Two simple cysts measuring 2.0 and 1.8 cm in diameter are unchanged. A 1.5 cm stone in the mid to lower pole is identified and was present on the prior CT. Abdominal aorta: No aneurysm visualized.  Atherosclerosis noted. Other findings: None. IMPRESSION: No acute abnormality.  Normal-appearing liver and gallbladder. Nonobstructing stone lower pole left kidney. Bilateral renal cysts. Atherosclerosis. Electronically Signed   By: Inge Rise M.D.   On: 09/20/2018 10:24      Impression / Plan:   Assessment: Active Problems:   Syncope   [name] is a 85 y.o. y/o male with anemia with a hemoglobin of 9.9 with multiple falls and thrombocytopenia.  The patient had a simple event which may be related to the anemia although his anemia is not that profound.  The patient was heme positive with his anemia and reports he has not had a colonoscopy in many years.  The patient did have an upper endoscopy in September 2019.  The patient was noted to have abnormal liver enzymes which is new for this patient.  Plan:  The patient was offered a work-up while in the hospital but states that he would rather wait a few days and even do it as an outpatient.  The patient was told the risks and benefits of the anemia including stroke heart attack and syncope.  The patient states he agrees and would rather wait on doing any further interventions at this time.  The patient can follow-up with Dr. Vicente Males upon discharge.  The patient's liver enzymes should be checked again in the future to see if they are trending up or down.  This can also be addressed by Dr. Vicente Males  at follow-up.  Thank you for involving me in the care of this patient.      LOS: 1 day   Lucilla Lame, MD  09/20/2018, 1:31 PM    Note: This dictation was prepared with Dragon dictation along with smaller phrase technology. Any transcriptional errors that result from this process are unintentional.

## 2018-09-20 NOTE — Progress Notes (Addendum)
Initial Nutrition Assessment  DOCUMENTATION CODES:   Underweight  INTERVENTION:   Ensure Enlive po TID, each supplement provides 350 kcal and 20 grams of protein  MVI daily   Vitamin C 250mg  po BID  Pt likely at high refeed risk; recommend monitor K, Mg and P labs daily   NUTRITION DIAGNOSIS:   Inadequate oral intake related to acute illness as evidenced by meal completion < 50%.  GOAL:   Patient will meet greater than or equal to 90% of their needs  MONITOR:   Supplement acceptance, Labs, Weight trends, PO intake, Skin, I & O's  REASON FOR ASSESSMENT:   Malnutrition Screening Tool    ASSESSMENT:   85 year old male with history of hypertension who has had decreased appetite for several weeks who presents after multiple falls today and syncopal episode.  RD working remotely.  Per chart review, pt with decreased appetite and oral intake for several weeks pta. Pt currently has poor appetite in hospital. RD will add supplements and vitamins to help pt meet his estimated needs. Per chart, pt appears weight stable for the past year.   Pt at high risk for malnutrition but unable to diagnose at this time as NFPE cannot be performed.   Medications reviewed and include: MVI, K phos, KCl, cefepime, NaCl @100ml /hr  Labs reviewed: K 3.3(L), BUN 40(H), creat 1.32(H), P 2.3(L), Mg 1.9 wnl Hgb 10.0(L), Hct 28.5(L)  Unable to complete Nutrition-Focused physical exam at this time.   Diet Order:   Diet Order            Diet regular Room service appropriate? Yes; Fluid consistency: Thin  Diet effective now             EDUCATION NEEDS:   No education needs have been identified at this time  Skin:  Skin Assessment: Reviewed RN Assessment  Last BM:  6/23- type 7  Height:   Ht Readings from Last 1 Encounters:  09/19/18 6' (1.829 m)    Weight:   Wt Readings from Last 1 Encounters:  09/19/18 60.5 kg    Ideal Body Weight:  80.9 kg  BMI:  Body mass index is 18.09  kg/m.  Estimated Nutritional Needs:   Kcal:  1700-2000kcal/day  Protein:  85-100g/day  Fluid:  >1.5L/day  Koleen Distance MS, RD, LDN Pager #- [phone] Office#- [phone] After Hours Pager: [phone]

## 2018-09-20 NOTE — Progress Notes (Signed)
Pharmacy Antibiotic Note  [name] is a 85 y.o. male admitted on 09/19/2018 with Fever. BCx positive for 1 of 4 GPR.  BCID negative.  Pharmacy has been consulted for Cefepime dosing.  Plan: Will start Cefepime 2g IV every 24 hours  Height: 6' (182.9 cm) Weight: 133 lb 6.4 oz (60.5 kg) IBW/kg (Calculated) : 77.6  Temp (24hrs), Avg:100.1 F (37.8 C), Min:98.3 F (36.8 C), Max:103.2 F (39.6 C)  Recent Labs  Lab 09/19/18 1535 09/19/18 1536  WBC 4.5  --   CREATININE 1.81*  --   LATICACIDVEN  --  1.8    Estimated Creatinine Clearance: 26.9 mL/min (A) (by C-G formula based on SCr of 1.81 mg/dL (H)).    No Known Allergies  Antimicrobials this admission: 6/23 cefepime >>   Microbiology results: 6/22  BCx:  1 of 4 GPR  Thank you for allowing pharmacy to be a part of this patient's care.  Pernell Dupre, PharmD, BCPS Clinical Pharmacist 09/20/2018 5:11 AM

## 2018-09-20 NOTE — Consult Note (Addendum)
PHARMACY CONSULT NOTE - FOLLOW UP  Pharmacy Consult for Electrolyte Monitoring and Replacement   Recent Labs: Potassium (mmol/L)  Date Value  09/20/2018 3.3 (L)   Magnesium (mg/dL)  Date Value  09/20/2018 1.9   Calcium (mg/dL)  Date Value  09/20/2018 7.9 (L)   Albumin (g/dL)  Date Value  09/19/2018 3.0 (L)   Phosphorus (mg/dL)  Date Value  09/20/2018 2.3 (L)   Sodium (mmol/L)  Date Value  09/20/2018 140   Corrected Ca: 8.7 Scr 1.81  Assessment: Patient admitted with  Hypokalemia. Pharmacy consulted for electrolyte monitoring and replacement.   Goal of Therapy:  Electrolytes WNL  Plan:  K+ 3.3. Will order KCL 68mEq PO x 1 dose.  Phos 2.3. Will order Kphos neutral tablets 2 tab every hours x 2.   Will F/U with AM labs and continue to replace electrolytes as needed.   Oswald Hillock, PharmD, BCPS Clinical Pharmacist 09/20/2018 8:07 AM

## 2018-09-20 NOTE — Progress Notes (Signed)
Patient had a black stool. Called dr to get order to hemoccult it. Will continue to monitor.

## 2018-09-20 NOTE — Progress Notes (Signed)
[facility] NAME: [name]    MR#:  [id number]  DATE OF BIRTH:  Apr 01, 1933  SUBJECTIVE:   Patient came in after he had for falls at home. Complains of bilateral hip pain and weakness. Had fever 102.6 yesterday. REVIEW OF SYSTEMS:   Review of Systems  Constitutional: Negative for chills, fever and weight loss.  HENT: Negative for ear discharge, ear pain and nosebleeds.   Eyes: Negative for blurred vision, pain and discharge.  Respiratory: Negative for sputum production, shortness of breath, wheezing and stridor.   Cardiovascular: Negative for chest pain, palpitations, orthopnea and PND.  Gastrointestinal: Positive for melena. Negative for abdominal pain, diarrhea, nausea and vomiting.  Genitourinary: Negative for frequency and urgency.  Musculoskeletal: Positive for falls and joint pain. Negative for back pain.  Neurological: Positive for weakness. Negative for sensory change, speech change and focal weakness.  Psychiatric/Behavioral: Negative for depression and hallucinations. The patient is not nervous/anxious.    Tolerating Diet:yes Tolerating PT: pending  DRUG ALLERGIES:  No Known Allergies  VITALS:  Blood pressure 111/71, pulse 79, temperature 98.4 F (36.9 C), temperature source Oral, resp. rate 16, height 6' (1.829 m), weight 60.5 kg, SpO2 100 %.  PHYSICAL EXAMINATION:   Physical Exam  GENERAL:  85 y.o.-year-old patient lying in the bed with no acute distress.  EYES: Pupils equal, round, reactive to light and accommodation. No scleral icterus. Extraocular muscles intact.  HEENT: Head atraumatic, normocephalic. Oropharynx and nasopharynx clear.    NECK:  Supple, no jugular venous distention. No thyroid enlargement, no tenderness.  LUNGS: Normal breath sounds bilaterally, no wheezing, rales, rhonchi. No use of accessory muscles of respiration.  CARDIOVASCULAR: S1, S2 normal. No murmurs, rubs, or gallops.   ABDOMEN: Soft, nontender, nondistended. Bowel sounds present. No organomegaly or mass.  EXTREMITIES: No cyanosis, clubbing or edema b/l.    NEUROLOGIC: Cranial nerves II through XII are intact. No focal Motor or sensory deficits b/l.  Overall weak PSYCHIATRIC:  patient is alert and oriented x 3.  SKIN: No obvious rash, lesion, or ulcer.   LABORATORY PANEL:  CBC Recent Labs  Lab 09/19/18 1535  WBC 4.5  HGB 9.9*  HCT 28.5*  PLT 57*    Chemistries  Recent Labs  Lab 09/19/18 1535  09/20/18 0533  NA 136  --  140  K 2.5*   < > 3.3*  CL 105  --  108  CO2 20*  --  19*  GLUCOSE 145*  --  105*  BUN 46*  --  40*  CREATININE 1.81*  --  1.32*  CALCIUM 7.9*  --  7.9*  MG  --   --  1.9  AST 158*  --   --   ALT 74*  --   --   ALKPHOS 76  --   --   BILITOT 0.9  --   --    < > = values in this interval not displayed.   Cardiac Enzymes Recent Labs  Lab 09/20/18 0533  TROPONINI 0.15*   RADIOLOGY:  Dg Chest 1 View  Result Date: 09/19/2018 CLINICAL DATA:  85 year old male with weakness and fever. COVID-19 status pending. EXAM: CHEST  1 VIEW COMPARISON:  CT Abdomen and Pelvis 01/20/2017. FINDINGS: Portable AP upright view at 1506 hours. Calcified aortic atherosclerosis. Other mediastinal contours are within normal limits. Visualized tracheal air column is within normal limits. Lung volumes are at the upper limits of normal. No pneumothorax, pleural effusion  or pulmonary edema. No confluent pulmonary opacity. External button artifact suspected over the anterior 2nd rib on image #1. Chronic appearing posterior right rib fractures. Negative visible bowel gas pattern. IMPRESSION: No acute cardiopulmonary abnormality. Electronically Signed   By: Genevie Ann M.D.   On: 09/19/2018 15:39   Ct Head Wo Contrast  Result Date: 09/19/2018 CLINICAL DATA:  Pt states he fell and hit his head on the bath tub today. Pt denies LOC EXAM: CT HEAD WITHOUT CONTRAST TECHNIQUE: Contiguous axial images were obtained  from the base of the skull through the vertex without intravenous contrast. COMPARISON:  None. FINDINGS: Brain: No evidence of acute infarction, hemorrhage, extra-axial collection, ventriculomegaly, or mass effect. Generalized cerebral atrophy. Periventricular white matter low attenuation likely secondary to microangiopathy. Vascular: Cerebrovascular atherosclerotic calcifications are noted. Skull: Negative for fracture or focal lesion. Sinuses/Orbits: Visualized portions of the orbits are unremarkable. Visualized portions of the paranasal sinuses and mastoid air cells are unremarkable. Other: Large left frontal scalp hematoma. IMPRESSION: No acute intracranial pathology. Electronically Signed   By: Kathreen Devoid   On: 09/19/2018 15:42   ASSESSMENT AND PLAN:   85 year old male with history of hypertension who has had decreased appetite for several weeks who presents after multiple falls today and syncopal episode.  1. Syncope with hypotension secondary dehydration, hypokalemia -remains in sinus rhythm  -troponin 0.19-- 0.17--- 0.15 -EKG shows ST inversion and lateral leads. No anticoagulation due to low platelets. - echocardiogram -holding of aspirin secondary to G.I. bleed. Physical therapy consultation  -IV fluids started CT head unremarkable  2.  Acute kidney injury in the setting of dehydration:  -Hold nephrotoxic medications including blood pressure medications  - IV fluids.  -Baseline creatinine 0.8 in October 2018 -comes in with creatinine of 1.82--- IV fluids-- 1.32  3.  Elevated troponin with syncope:  Continue telemetry and follow-up on echocardiogram.  Cardiology consultation with Dr. Clayborn Bigness.  4.  Elevated liver function with low platelet count -check ultrasound abdomen  5.  Fever: Patient had a fever in the emergency room.   -Follow-up on final blood cultures. -Patient empirically on IV antibiotics.. Chest x-ray and UA are unremarkable. -Blood culture one out of two  gram-positive rod  6.  Hypertension: Hold home medication for now due to low blood pressure and dehydration as well as acute kidney injury. Continue to follow  7. HYpokalemia: REplete and  - MG within normal limits  8. History of prostate cancer -will check pelvic x-rays given falls and hip pain  Spoke with daughter [name] updated  CODE STATUS: full  DVT Prophylaxis: SCD  TOTAL TIME TAKING CARE OF THIS PATIENT: *30* minutes.  >50% time spent on counselling and coordination of care  POSSIBLE D/C IN 1 to 2* DAYS, DEPENDING ON CLINICAL CONDITION.  Note: This dictation was prepared with Dragon dictation along with smaller phrase technology. Any transcriptional errors that result from this process are unintentional.  Fritzi Mandes M.D on 09/20/2018 at 8:11 AM  Between 7am to 6pm - Pager - [phone]  After 6pm go to [email or web address] - password [email or web address] [facility]  Office  [phone]  CC: Primary care physician; [name], MDPatient ID: [name], male   DOB: [birth year], 85 y.o.   MRN: [id number]

## 2018-09-20 NOTE — Evaluation (Signed)
Physical Therapy Evaluation Patient Details Name: [name] MRN: [id number] DOB: 06/02/1933 Today's Date: 09/20/2018   History of Present Illness  Pt admitted for syncope with multiple falls at home. Complains of hip pain upon arrival to hospital. History includes HTN and prostate cancer.  Noted multiple abrasions to body   Clinical Impression  Pt is a pleasant 85 year old male who was admitted for syncope with multiple falls at home. Pt performs bed mobility with mod I, transfers with cga, and ambulation with cga and RW. Orthostatics performed supine: 140/84, HR 81; seated: 132/76, HR 90; standing: 138/87, HR 97. No dizziness noted. Pt demonstrates deficits with strength/balance. Would benefit from skilled PT to address above deficits and promote optimal return to PLOF. Recommend OP PT.    Follow Up Recommendations Outpatient PT    Equipment Recommendations  None recommended by PT    Recommendations for Other Services       Precautions / Restrictions Precautions Precautions: Fall Restrictions Weight Bearing Restrictions: No      Mobility  Bed Mobility Overal bed mobility: Modified Independent             General bed mobility comments: safe technique. Effortful  Transfers Overall transfer level: Needs assistance Equipment used: Rolling walker (2 wheeled) Transfers: Sit to/from Stand Sit to Stand: Min guard         General transfer comment: able to stand, slight post leaning, improves with minimal cues. Able to take hands off RW and balance with supervision  Ambulation/Gait Ambulation/Gait assistance: Min guard Gait Distance (Feet): 200 Feet Assistive device: Rolling walker (2 wheeled) Gait Pattern/deviations: Step-through pattern;Decreased step length - left Gait velocity: 10' in 7 seconds   General Gait Details: ambulated with slight shuffling gait, however improved once in hallway. No LOB and follows directions well. No dizziness symptoms.  Stairs             Wheelchair Mobility    Modified Rankin (Stroke Patients Only)       Balance Overall balance assessment: History of Falls;Needs assistance Sitting-balance support: Feet supported Sitting balance-Leahy Scale: Good     Standing balance support: No upper extremity supported Standing balance-Leahy Scale: Good                               Pertinent Vitals/Pain Pain Assessment: No/denies pain    Home Living Family/patient expects to be discharged to:: Private residence Living Arrangements: Alone Available Help at Discharge: Family Type of Home: Mobile home Home Access: Stairs to enter Entrance Stairs-Rails: Can reach both Entrance Stairs-Number of Steps: 6 Home Layout: One level Home Equipment: Walker - 2 wheels;Walker - 4 wheels;Cane - single point;Bedside commode Additional Comments: he reports ex-wife has equipment and will let him borrow what ever he needs at discharge. He prefers not to purchase anything new    Prior Function Level of Independence: Independent         Comments: reports no falls in past year until this admission     Hand Dominance        Extremity/Trunk Assessment   Upper Extremity Assessment Upper Extremity Assessment: Overall WFL for tasks assessed    Lower Extremity Assessment Lower Extremity Assessment: Generalized weakness(B LE grossly 4/5)       Communication   Communication: No difficulties  Cognition Arousal/Alertness: Awake/alert Behavior During Therapy: WFL for tasks assessed/performed Overall Cognitive Status: Within Functional Limits for tasks assessed  General Comments      Exercises Other Exercises Other Exercises: seated ther-ex performed on B LE including AP, SLRs, and hip abd/add. 10 reps with supervision and cues Other Exercises: orthostatics obtained Other Exercises: ambulated around RN station with cues for sequencing and correcct  use of RW   Assessment/Plan    PT Assessment Patient needs continued PT services  PT Problem List Decreased strength;Decreased balance;Decreased mobility       PT Treatment Interventions DME instruction;Gait training;Therapeutic exercise;Balance training    PT Goals (Current goals can be found in the Care Plan section)  Acute Rehab PT Goals Patient Stated Goal: to go home PT Goal Formulation: With patient Time For Goal Achievement: 10/04/18 Potential to Achieve Goals: Good    Frequency Min 2X/week   Barriers to discharge        Co-evaluation               AM-PAC PT "6 Clicks" Mobility  Outcome Measure Help needed turning from your back to your side while in a flat bed without using bedrails?: None Help needed moving from lying on your back to sitting on the side of a flat bed without using bedrails?: None Help needed moving to and from a bed to a chair (including a wheelchair)?: A Little Help needed standing up from a chair using your arms (e.g., wheelchair or bedside chair)?: A Little Help needed to walk in hospital room?: A Little Help needed climbing 3-5 steps with a railing? : A Little 6 Click Score: 20    End of Session Equipment Utilized During Treatment: Gait belt Activity Tolerance: Patient tolerated treatment well Patient left: in chair;with chair alarm set Nurse Communication: Mobility status PT Visit Diagnosis: Unsteadiness on feet (R26.81);Muscle weakness (generalized) (M62.81);History of falling (Z91.81);Difficulty in walking, not elsewhere classified (R26.2);Dizziness and giddiness (R42)    Time: 1610-9604 PT Time Calculation (min) (ACUTE ONLY): 38 min   Charges:   PT Evaluation $PT Eval Low Complexity: 1 Low PT Treatments $Gait Training: 8-22 mins $Therapeutic Exercise: 8-22 mins        Greggory Stallion, PT, DPT [phone]   Arliss Frisina 09/20/2018, 2:01 PM

## 2018-09-20 NOTE — Progress Notes (Signed)
Ch visited w/ pt to complete an AD for HPOA. Pt was sitting upright in the recliner upon ch arrival. Pt seemed to not have an appetite but presented to be weak/famished. Ch educated the pt on the difference between a HPOA and a durable POA. Pt seemed to understand and shared that it was something he has been wanting to complete for a while. As reported by daughter and pt, pt lives alone and has been experiencing dizzy spells lately. Ch is concerned with the pt's ability to manage on his own and if he would be a good candidate for H-H services. Ch did not finalize the AD yet since the pt had a hard time signing his name. Ch informed daughter via telephone that another ch could try again tomorrow hopefully before pt is d/c. Daughter was appreciative of the f/u and hopeful that the AD could be finalized once the pt's strength has improved.   F/u should include completing [place] with pt, notarizing AD, and communicating w/ care team regarding TOC.    09/20/18 1115  Clinical Encounter Type  Visited With Health care provider;Patient and family together  Visit Type Psychological support;Spiritual support;Social support;Other (Comment) (AD education )  Referral From Physician  Consult/Referral To Chaplain  Recommendations f/u with pt to finalize AD for HPOA   Spiritual Encounters  Spiritual Needs Emotional;Grief support  Stress Factors  Patient Stress Factors Health changes;Loss of control;Major life changes  Family Stress Factors Major life changes  Advance Directives (For Healthcare)  Does Patient Have a Medical Advance Directive? No  Would patient like information on creating a medical advance directive? Yes (Inpatient - patient requests chaplain consult to create a medical advance directive)

## 2018-09-20 NOTE — Progress Notes (Signed)
PHARMACY - PHYSICIAN COMMUNICATION CRITICAL VALUE ALERT - BLOOD CULTURE IDENTIFICATION (BCID)  [name] is an 85 y.o. male who presented to [facility] on 09/19/2018 with a chief complaint of Syncope/Fall  Assessment:  Admitted  with syncope w/hypotension, AKI, and hypokalemia. Fecal Occult came back positive.  WBC: WNL Tmax: 103.2.  Antibiotics were held due to no apparent source of infection. Lab is reporting 1 of 4 bottles GPRs. BCID did not show anything.   Name of physician (or Provider) Contacted: Eugenie Norrie, MD  Current antibiotics: None  Changes to prescribed antibiotics recommended:  Patient will be started on Cefepime due to Tmax of 103.2  Results for orders placed or performed during the hospital encounter of 09/19/18  Blood Culture ID Panel (Reflexed) (Collected: 09/19/2018  3:36 PM)  Result Value Ref Range   Enterococcus species NOT DETECTED NOT DETECTED   Listeria monocytogenes NOT DETECTED NOT DETECTED   Staphylococcus species NOT DETECTED NOT DETECTED   Staphylococcus aureus (BCID) NOT DETECTED NOT DETECTED   Streptococcus species NOT DETECTED NOT DETECTED   Streptococcus agalactiae NOT DETECTED NOT DETECTED   Streptococcus pneumoniae NOT DETECTED NOT DETECTED   Streptococcus pyogenes NOT DETECTED NOT DETECTED   Acinetobacter baumannii NOT DETECTED NOT DETECTED   Enterobacteriaceae species NOT DETECTED NOT DETECTED   Enterobacter cloacae complex NOT DETECTED NOT DETECTED   Escherichia coli NOT DETECTED NOT DETECTED   Klebsiella oxytoca NOT DETECTED NOT DETECTED   Klebsiella pneumoniae NOT DETECTED NOT DETECTED   Proteus species NOT DETECTED NOT DETECTED   Serratia marcescens NOT DETECTED NOT DETECTED   Haemophilus influenzae NOT DETECTED NOT DETECTED   Neisseria meningitidis NOT DETECTED NOT DETECTED   Pseudomonas aeruginosa NOT DETECTED NOT DETECTED   Candida albicans NOT DETECTED NOT DETECTED   Candida glabrata NOT DETECTED NOT DETECTED   Candida krusei  NOT DETECTED NOT DETECTED   Candida parapsilosis NOT DETECTED NOT DETECTED   Candida tropicalis NOT DETECTED NOT [place], PharmD, BCPS Clinical Pharmacist 09/20/2018 5:09 AM

## 2018-09-20 NOTE — Consult Note (Signed)
PHARMACY CONSULT NOTE - FOLLOW UP  Pharmacy Consult for Electrolyte Monitoring and Replacement   Recent Labs: Potassium (mmol/L)  Date Value  09/19/2018 3.1 (L)   Calcium (mg/dL)  Date Value  09/19/2018 7.9 (L)   Albumin (g/dL)  Date Value  09/19/2018 3.0 (L)   Sodium (mmol/L)  Date Value  09/19/2018 136   Corrected Ca: 8.7 Scr 1.81  Assessment: Patient admitted with  Hypokalemia. Pharmacy consulted for electrolyte monitoring and replacement.   Goal of Therapy:  Electrolytes WNL  Plan:  6/22 @ 2354 K: 3.1. Will order KCL 23mEq PO x 1 dose.   Will F/U with AM labs and continue to replace electrolytes as needed.   Pernell Dupre, PharmD, BCPS Clinical Pharmacist 09/20/2018 12:13 AM

## 2018-09-21 ENCOUNTER — Inpatient Hospital Stay: Payer: Medicare Other

## 2018-09-21 DIAGNOSIS — R296 Repeated falls: Secondary | ICD-10-CM

## 2018-09-21 DIAGNOSIS — D649 Anemia, unspecified: Secondary | ICD-10-CM

## 2018-09-21 DIAGNOSIS — Z87891 Personal history of nicotine dependence: Secondary | ICD-10-CM

## 2018-09-21 DIAGNOSIS — D696 Thrombocytopenia, unspecified: Secondary | ICD-10-CM

## 2018-09-21 DIAGNOSIS — R55 Syncope and collapse: Secondary | ICD-10-CM

## 2018-09-21 DIAGNOSIS — I10 Essential (primary) hypertension: Secondary | ICD-10-CM

## 2018-09-21 DIAGNOSIS — R509 Fever, unspecified: Secondary | ICD-10-CM

## 2018-09-21 DIAGNOSIS — Z8546 Personal history of malignant neoplasm of prostate: Secondary | ICD-10-CM

## 2018-09-21 DIAGNOSIS — D61818 Other pancytopenia: Secondary | ICD-10-CM

## 2018-09-21 LAB — PROTIME-INR
INR: 1.3 — ABNORMAL HIGH (ref 0.8–1.2)
Prothrombin Time: 16.1 seconds — ABNORMAL HIGH (ref 11.4–15.2)

## 2018-09-21 LAB — HEPATIC FUNCTION PANEL
ALT: 114 U/L — ABNORMAL HIGH (ref 0–44)
AST: 294 U/L — ABNORMAL HIGH (ref 15–41)
Albumin: 2.4 g/dL — ABNORMAL LOW (ref 3.5–5.0)
Alkaline Phosphatase: 112 U/L (ref 38–126)
Bilirubin, Direct: 0.4 mg/dL — ABNORMAL HIGH (ref 0.0–0.2)
Indirect Bilirubin: 0.3 mg/dL (ref 0.3–0.9)
Total Bilirubin: 0.7 mg/dL (ref 0.3–1.2)
Total Protein: 4.4 g/dL — ABNORMAL LOW (ref 6.5–8.1)

## 2018-09-21 LAB — CREATININE, SERUM
Creatinine, Ser: 0.9 mg/dL (ref 0.61–1.24)
GFR calc Af Amer: >60 mL/min (ref >60)
GFR calc non Af Amer: >60 mL/min (ref >60)

## 2018-09-21 LAB — CBC
HCT: 25.5 % — ABNORMAL LOW (ref 39.0–52.0)
Hemoglobin: 8.7 g/dL — ABNORMAL LOW (ref 13.0–17.0)
MCH: 29.8 pg (ref 26.0–34.0)
MCHC: 34.1 g/dL (ref 30.0–36.0)
MCV: 87.3 fL (ref 80.0–100.0)
Platelets: 52 10*3/uL — ABNORMAL LOW (ref 150–400)
RBC: 2.92 MIL/uL — ABNORMAL LOW (ref 4.22–5.81)
RDW: 13.4 % (ref 11.5–15.5)
WBC: 3.6 10*3/uL — ABNORMAL LOW (ref 4.0–10.5)
nRBC: 0 % (ref 0.0–0.2)

## 2018-09-21 LAB — BASIC METABOLIC PANEL
Anion gap: 9 (ref 5–15)
BUN: 38 mg/dL — ABNORMAL HIGH (ref 8–23)
CO2: 19 mmol/L — ABNORMAL LOW (ref 22–32)
Calcium: 7.4 mg/dL — ABNORMAL LOW (ref 8.9–10.3)
Chloride: 113 mmol/L — ABNORMAL HIGH (ref 98–111)
Creatinine, Ser: 0.91 mg/dL (ref 0.61–1.24)
GFR calc Af Amer: >60 mL/min (ref >60)
GFR calc non Af Amer: >60 mL/min (ref >60)
Glucose, Bld: 103 mg/dL — ABNORMAL HIGH (ref 70–99)
Potassium: 3 mmol/L — ABNORMAL LOW (ref 3.5–5.1)
Sodium: 141 mmol/L (ref 135–145)

## 2018-09-21 LAB — IMMATURE PLATELET FRACTION: Immature Platelet Fraction: 12.3 % — ABNORMAL HIGH (ref 1.2–8.6)

## 2018-09-21 LAB — FIBRINOGEN: Fibrinogen: 144 mg/dL — ABNORMAL LOW (ref 210–475)

## 2018-09-21 LAB — APTT: aPTT: 39 seconds — ABNORMAL HIGH (ref 24–36)

## 2018-09-21 LAB — CK: Total CK: 548 U/L — ABNORMAL HIGH (ref 49–397)

## 2018-09-21 LAB — PHOSPHORUS: Phosphorus: 2 mg/dL — ABNORMAL LOW (ref 2.5–4.6)

## 2018-09-21 LAB — VITAMIN B12: Vitamin B-12: 1072 pg/mL — ABNORMAL HIGH (ref 180–914)

## 2018-09-21 LAB — FOLATE: Folate: 26 ng/mL (ref >5.9)

## 2018-09-21 LAB — FERRITIN: Ferritin: >7500 ng/mL — ABNORMAL HIGH (ref 24–336)

## 2018-09-21 LAB — LACTATE DEHYDROGENASE: LDH: 1099 U/L — ABNORMAL HIGH (ref 98–192)

## 2018-09-21 LAB — PATHOLOGIST SMEAR REVIEW

## 2018-09-21 MED ORDER — POTASSIUM CHLORIDE CRYS ER 20 MEQ PO TBCR
40.0000 meq | EXTENDED_RELEASE_TABLET | ORAL | Status: AC
  Administered 2018-09-21 (×2): 40 meq via ORAL
  Filled 2018-09-21 (×2): qty 2

## 2018-09-21 MED ORDER — K PHOS MONO-SOD PHOS DI & MONO 155-852-130 MG PO TABS
500.0000 mg | ORAL_TABLET | ORAL | Status: AC
  Administered 2018-09-21 (×3): 500 mg via ORAL
  Filled 2018-09-21 (×4): qty 2

## 2018-09-21 MED ORDER — LISINOPRIL 20 MG PO TABS
20.0000 mg | ORAL_TABLET | Freq: Every day | ORAL | Status: DC
  Administered 2018-09-21 – 2018-09-22 (×2): 20 mg via ORAL
  Filled 2018-09-21 (×2): qty 1

## 2018-09-21 MED ORDER — VANCOMYCIN HCL 1.5 G IV SOLR
1500.0000 mg | Freq: Once | INTRAVENOUS | Status: AC
  Administered 2018-09-21: 1500 mg via INTRAVENOUS
  Filled 2018-09-21: qty 1500

## 2018-09-21 MED ORDER — SODIUM CHLORIDE 0.9 % IV SOLN
100.0000 mg | Freq: Two times a day (BID) | INTRAVENOUS | Status: DC
  Administered 2018-09-21 – 2018-09-23 (×4): 100 mg via INTRAVENOUS
  Filled 2018-09-21 (×6): qty 100

## 2018-09-21 MED ORDER — VANCOMYCIN HCL 10 G IV SOLR
1250.0000 mg | INTRAVENOUS | Status: DC
  Filled 2018-09-21: qty 1250

## 2018-09-21 MED ORDER — STERILE WATER FOR INJECTION IV SOLN
Freq: Once | INTRAVENOUS | Status: AC
  Administered 2018-09-21: 09:00:00 via INTRAVENOUS
  Filled 2018-09-21: qty 9.71

## 2018-09-21 MED ORDER — SODIUM CHLORIDE 0.9 % IV SOLN
2.0000 g | Freq: Two times a day (BID) | INTRAVENOUS | Status: DC
  Administered 2018-09-21 – 2018-09-22 (×2): 2 g via INTRAVENOUS
  Filled 2018-09-21 (×3): qty 2

## 2018-09-21 MED ORDER — PANTOPRAZOLE SODIUM 40 MG PO TBEC
40.0000 mg | DELAYED_RELEASE_TABLET | Freq: Two times a day (BID) | ORAL | Status: DC
  Administered 2018-09-21 – 2018-09-22 (×2): 40 mg via ORAL
  Filled 2018-09-21 (×2): qty 1

## 2018-09-21 NOTE — Consult Note (Signed)
Pharmacy Antibiotic Note  [name] is a 85 y.o. male admitted on 09/19/2018 with bacteremia.  Pharmacy has been consulted for vancomycin dosing. BCx positive for 1 of 4 GPR. BCID negative.  Plan: Will give vancomycin 1500 mg x 1 loading dose followed by a maintenance dose fo 1250 mg q24H for a predicted AUC of 467. Goal AUC is 400-550. Plan to obtain level on 4-5th day.   Will adjust cefepime dose to 2g q12H per CrCl.   Height: 6' (182.9 cm) Weight: 133 lb 6.4 oz (60.5 kg) IBW/kg (Calculated) : 77.6  Temp (24hrs), Avg:100.1 F (37.8 C), Min:98.2 F (36.8 C), Max:103 F (39.4 C)  Recent Labs  Lab 09/19/18 1535 09/19/18 1536 09/20/18 0533 09/21/18 0654  WBC 4.5  --   --  3.6*  CREATININE 1.81*  --  1.32* 0.91  LATICACIDVEN  --  1.8  --   --     Estimated Creatinine Clearance: 53.6 mL/min (by C-G formula based on SCr of 0.91 mg/dL).    No Known Allergies  Antimicrobials this admission: 6/24 vancomycin >>  6/23 cefepime >>   Dose adjustments this admission: Cefepime 2 g24H >> cefepime 2g q12H  Microbiology results: 6/22 BCx: 1 out of 4 positive for GPC BCID negative.  6/22 UCx: NGTD   Thank you for allowing pharmacy to be a part of this patient's care.  Oswald Hillock, PharmD, BCPS 09/21/2018 12:06 PM

## 2018-09-21 NOTE — Progress Notes (Signed)
Patient ID: [name], male   DOB: 07-06-1933, 85 y.o.   MRN: [id number]  Spoke with radiology reported MRI showed delayed intracranial hemorrhage, with interval development of acute left subdural hematoma, measuring up to 8 mm in maximal thickness. Associated mild mass effect with trace 2 mm left-to-right shift.2. 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  Discussed with Dr. Cari Caraway to see patient. Discontinued aspirin   sopoke with patient's daughter [name] and updated her.

## 2018-09-21 NOTE — Progress Notes (Signed)
[facility] NAME: [name]    MR#:  [id number]  DATE OF BIRTH:  1933/10/11  SUBJECTIVE:  haiving some intermittent difficulty with speech. No focal weakness Patient came in after he had for falls at home. Complains of bilateral hip pain and weakness. Had fever 103.3 yesterday. REVIEW OF SYSTEMS:   Review of Systems  Constitutional: Negative for chills, fever and weight loss.  HENT: Negative for ear discharge, ear pain and nosebleeds.   Eyes: Negative for blurred vision, pain and discharge.  Respiratory: Negative for sputum production, shortness of breath, wheezing and stridor.   Cardiovascular: Negative for chest pain, palpitations, orthopnea and PND.  Gastrointestinal: Positive for melena. Negative for abdominal pain, diarrhea, nausea and vomiting.  Genitourinary: Negative for frequency and urgency.  Musculoskeletal: Positive for falls and joint pain. Negative for back pain.  Neurological: Positive for weakness. Negative for sensory change, speech change and focal weakness.  Psychiatric/Behavioral: Negative for depression and hallucinations. The patient is not nervous/anxious.    Tolerating Diet:yes Tolerating PT: Out pt PT  DRUG ALLERGIES:  No Known Allergies  VITALS:  Blood pressure 135/85, pulse 92, temperature 98.6 F (37 C), temperature source Oral, resp. rate 19, height 6' (1.829 m), weight 60.5 kg, SpO2 99 %.  PHYSICAL EXAMINATION:   Physical Exam  GENERAL:  85 y.o.-year-old patient lying in the bed with no acute distress.  EYES: Pupils equal, round, reactive to light and accommodation. No scleral icterus. Extraocular muscles intact.  HEENT: Head atraumatic, normocephalic. Oropharynx and nasopharynx clear.    NECK:  Supple, no jugular venous distention. No thyroid enlargement, no tenderness.  LUNGS: Normal breath sounds bilaterally, no wheezing, rales, rhonchi. No use of accessory muscles of  respiration.  CARDIOVASCULAR: S1, S2 normal. No murmurs, rubs, or gallops.  ABDOMEN: Soft, nontender, nondistended. Bowel sounds present. No organomegaly or mass.  EXTREMITIES: No cyanosis, clubbing or edema b/l.    NEUROLOGIC: Cranial nerves II through XII are intact. No focal Motor or sensory deficits b/l.  Overall weak, mild dysarthria PSYCHIATRIC:  patient is alert and oriented x 3.  SKIN: No obvious rash, lesion, or ulcer.   LABORATORY PANEL:  CBC Recent Labs  Lab 09/21/18 0654  WBC 3.6*  HGB 8.7*  HCT 25.5*  PLT 52*    Chemistries  Recent Labs  Lab 09/19/18 1535  09/20/18 0533 09/21/18 0654  NA 136  --  140 141  K 2.5*   < > 3.3* 3.0*  CL 105  --  108 113*  CO2 20*  --  19* 19*  GLUCOSE 145*  --  105* 103*  BUN 46*  --  40* 38*  CREATININE 1.81*  --  1.32* 0.91  CALCIUM 7.9*  --  7.9* 7.4*  MG  --   --  1.9  --   AST 158*  --   --   --   ALT 74*  --   --   --   ALKPHOS 76  --   --   --   BILITOT 0.9  --   --   --    < > = values in this interval not displayed.   Cardiac Enzymes Recent Labs  Lab 09/20/18 0533  TROPONINI 0.15*   RADIOLOGY:  Dg Chest 1 View  Result Date: 09/19/2018 CLINICAL DATA:  85 year old male with weakness and fever. COVID-19 status pending. EXAM: CHEST  1 VIEW COMPARISON:  CT Abdomen and Pelvis 01/20/2017. FINDINGS: Portable  AP upright view at 1506 hours. Calcified aortic atherosclerosis. Other mediastinal contours are within normal limits. Visualized tracheal air column is within normal limits. Lung volumes are at the upper limits of normal. No pneumothorax, pleural effusion or pulmonary edema. No confluent pulmonary opacity. External button artifact suspected over the anterior 2nd rib on image #1. Chronic appearing posterior right rib fractures. Negative visible bowel gas pattern. IMPRESSION: No acute cardiopulmonary abnormality. Electronically Signed   By: Genevie Ann M.D.   On: 09/19/2018 15:39   Dg Pelvis 1-2 Views  Result Date:  09/20/2018 CLINICAL DATA:  Multiple falls.  Bilateral hip pain and weakness. EXAM: PELVIS - 1-2 VIEW COMPARISON:  CT 01/20/2017. FINDINGS: Diffuse osteopenia. Degenerative change both hips. No evidence of fracture or dislocation. A stable tiny sclerotic density noted in the left pubis consistent with a bone island. Pelvic calcifications consistent phleboliths. Peripheral vascular calcification. IMPRESSION: 1. Diffuse osteopenia. Degenerative change both hips. No acute bony abnormality. 2.  Peripheral vascular disease. Electronically Signed   By: Marcello Moores  Register   On: 09/20/2018 09:56   Ct Head Wo Contrast  Result Date: 09/19/2018 CLINICAL DATA:  Pt states he fell and hit his head on the bath tub today. Pt denies LOC EXAM: CT HEAD WITHOUT CONTRAST TECHNIQUE: Contiguous axial images were obtained from the base of the skull through the vertex without intravenous contrast. COMPARISON:  None. FINDINGS: Brain: No evidence of acute infarction, hemorrhage, extra-axial collection, ventriculomegaly, or mass effect. Generalized cerebral atrophy. Periventricular white matter low attenuation likely secondary to microangiopathy. Vascular: Cerebrovascular atherosclerotic calcifications are noted. Skull: Negative for fracture or focal lesion. Sinuses/Orbits: Visualized portions of the orbits are unremarkable. Visualized portions of the paranasal sinuses and mastoid air cells are unremarkable. Other: Large left frontal scalp hematoma. IMPRESSION: No acute intracranial pathology. Electronically Signed   By: Kathreen Devoid   On: 09/19/2018 15:42   US Abdomen Complete  Result Date: 09/20/2018 CLINICAL DATA:  Elevated liver function tests. EXAM: ABDOMEN ULTRASOUND COMPLETE COMPARISON:  CT abdomen and pelvis 01/20/2017. FINDINGS: Gallbladder: No gallstones or wall thickening visualized. No sonographic Murphy sign noted by sonographer. Common bile duct: Diameter: 0.2 cm Liver: No focal lesion identified. Within normal limits in  parenchymal echogenicity. Portal vein is patent on color Doppler imaging with normal direction of blood flow towards the liver. IVC: No abnormality visualized. Pancreas: Visualized portion unremarkable. Spleen: Size and appearance within normal limits. A few small calcifications in the spleen consistent with old granulomatous disease are seen as on prior CT. Right Kidney: Length: 12.5 cm. Echogenicity within normal limits. No solid mass or hydronephrosis visualized. 7.4 cm in diameter cyst is identified as seen on prior CT. Calcification in the wall of this cyst is unchanged. Left Kidney: Length: 12.5 cm. Echogenicity within normal limits. No solid mass or hydronephrosis visualized. Two simple cysts measuring 2.0 and 1.8 cm in diameter are unchanged. A 1.5 cm stone in the mid to lower pole is identified and was present on the prior CT. Abdominal aorta: No aneurysm visualized.  Atherosclerosis noted. Other findings: None. IMPRESSION: No acute abnormality.  Normal-appearing liver and gallbladder. Nonobstructing stone lower pole left kidney. Bilateral renal cysts. Atherosclerosis. Electronically Signed   By: Inge Rise M.D.   On: 09/20/2018 10:24   ASSESSMENT AND PLAN:   85 year old male with history of hypertension who has had decreased appetite for several weeks who presents after multiple falls today and syncopal episode.  1. Syncope with hypotension secondary dehydration, hypokalemia -remains in sinus rhythm  -troponin  0.19-- 0.17--- 0.15 -EKG shows ST inversion and lateral leads. No anticoagulation due to low platelets. - echocardiogram done results pending -holding of aspirin secondary to G.I. bleed. Physical therapy consultation --out pt PT -IV fluids started--changed to bicarb gtt CT head unremarkable -MRI brain today--dysarthria  2.  Acute kidney injury in the setting of dehydration:  -Hold nephrotoxic medications including blood pressure medications  - IV fluids.  -Baseline  creatinine 0.8 in October 2018 -comes in with creatinine of 1.82--- IV fluids-- 1.32 -good UOP  3.  Elevated troponin with syncope:  Continue telemetry and follow-up on echocardiogram.   -Cardiology consultation with Dr. Carlus Pavlov present meds -pt denies any cp  4.  Elevated liver function with low platelet count -check ultrasound abdomen  5.  Fever: etio unclear ?viral -BC 1/4 GPR  -Patient empirically on IV antibiotics--cefepime Chest x-ray and UA are unremarkable. -UC neg -Blood culture one out of two gram-positive rod -COVID-19 on admission neg -tmax 103 -ID consulted USG abdomen--no acute abnormality  6.  Hypertension: Hold home medication for now due to low blood pressure and dehydration as well as acute kidney injury. Continue to follow  7. HYpokalemia: REplete and  - MG within normal limits  8. History of prostate cancer -pelvic x-rays  Shows diffuse osteopenia  9.Acute on Chronic anemia with melena with pancytopenia (new) -GI saw pt--colonoscopy as out pt (pt preference) -EGD 11/2017--Barretts esophagus -hgb 9.0--10.0--8.7  Spoke with daughter [name] updated 6/23  CODE STATUS: full  DVT Prophylaxis: SCD  TOTAL TIME TAKING CARE OF THIS PATIENT: *30* minutes.  >50% time spent on counselling and coordination of care  POSSIBLE D/C IN 1 to 2* DAYS, DEPENDING ON CLINICAL CONDITION.  Note: This dictation was prepared with Dragon dictation along with smaller phrase technology. Any transcriptional errors that result from this process are unintentional.  Fritzi Mandes M.D on 09/21/2018 at 8:54 AM  Between 7am to 6pm - Pager - [phone]  After 6pm go to [email or web address] - password [email or web address] [facility]  Office  [phone]  CC: Primary care physician; [name], MDPatient ID: [name], male   DOB: Jun 18, 1933, 85 y.o.   MRN: [id number]

## 2018-09-21 NOTE — Consult Note (Addendum)
Hematology/Oncology Consult note [facility] Telephone:([phone] [phone]  Patient Care Team: [name], MD as PCP - General (Family Medicine)   Name of the patient: [name]  [id number]  13-Aug-1933   Date of visit: 09/21/18  REASON FOR COSULTATION:  Thrombocytopenia and anemia History of presenting illness-  85 y.o. male with history of prostate cancer, hypertension who presented emergency room due to multiple falls at home. Reports being dizzy and lightheaded.  Initial blood pressure was low and was given 250 cc of IV fluid initially in the emergency room.  CT head unremarkable. MRI 09/21/2018 showed delayed intracranial hemorrhage with interval development of acute left subdural hematoma, measuring up to 8 mm in maximal thickness.  Associated mild mass-effect with trace 2 mm left-to-right shift.  Punctate 4 mm acute ischemic cortical infarct involving underlying left frontal operculum.  Prominent pituitary gland.  Patient was found to be febrile in the emergency room, has been on empiric antibiotics.  Chest x-ray and UA are unremarkable.  LDH was significantly elevated, > 1000, CBC showed hemoglobin 8.7, platelet count 52,000 white count 3.6. Blood culture grew gram-positive rods, 1 out of 2 bottles. UA showed microscopic hematuria. Stool occult positive ID was consulted.  Recommending heme-onc consultation.  Patient was seen and evaluated at bedside.  Reports feeling weak recently.  Appetite is poor.  Denies any pain. Lives by himself.  His ex was recently hospitalized and he has been busy taking care of her.  Lost weight cannot tell me how much he has lost weight.  He cooks for himself, diet is composed of mainly vegetables and some meat.  Admits to drinking 1 beer almost daily.  No hard liquor  History of prostate cancer, previously followed by Dr. Jacqlyn Larsen.  Records not available in EMR. Patient had a PSA checked on 12/29/18 18 which  was 3.31.  Review of Systems  Constitutional: Positive for appetite change, fatigue and unexpected weight change. Negative for chills and fever.  HENT:   Negative for hearing loss and voice change.   Eyes: Negative for eye problems and icterus.  Respiratory: Negative for chest tightness, cough and shortness of breath.   Cardiovascular: Negative for chest pain and leg swelling.  Gastrointestinal: Negative for abdominal distention and abdominal pain.  Endocrine: Negative for hot flashes.  Genitourinary: Negative for difficulty urinating, dysuria and frequency.   Musculoskeletal: Negative for arthralgias.  Skin: Negative for itching and rash.  Neurological: Positive for dizziness. Negative for light-headedness and numbness.  Hematological: Negative for adenopathy. Does not bruise/bleed easily.  Psychiatric/Behavioral: Negative for confusion.    No Known Allergies  Patient Active Problem List   Diagnosis Date Noted   Blood in stool    Elevated transaminase level    Syncope 09/19/2018     Past Medical History:  Diagnosis Date   Arthritis    Cancer [facility]    Prostate Cancer   Hypertension      Past Surgical History:  Procedure Laterality Date   ESOPHAGOGASTRODUODENOSCOPY (EGD) WITH PROPOFOL N/A 12/06/2017   Procedure: ESOPHAGOGASTRODUODENOSCOPY (EGD) WITH PROPOFOL;  Surgeon: Jonathon Bellows, MD;  Location: [facility] ENDOSCOPY;  Service: Gastroenterology;  Laterality: N/A;   PROSTATE CRYOABLATION      Social History   Socioeconomic History   Marital status: Widowed    Spouse name: Not on file   Number of children: Not on file   Years of education: Not on file   Highest education level: Not on file  Occupational History  Not on file  Social Needs   Financial resource strain: Not on file   Food insecurity    Worry: Not on file    Inability: Not on file   Transportation needs    Medical: Not on file    Non-medical: Not on file  Tobacco Use   Smoking status:  Former Smoker    Types: Pipe    Quit date: 03/30/2002    Years since quitting: 16.4   Smokeless tobacco: Never Used  Substance and Sexual Activity   Alcohol use: Yes   Drug use: Never   Sexual activity: Not on file  Lifestyle   Physical activity    Days per week: Not on file    Minutes per session: Not on file   Stress: Not on file  Relationships   Social connections    Talks on phone: Not on file    Gets together: Not on file    Attends religious service: Not on file    Active member of club or organization: Not on file    Attends meetings of clubs or organizations: Not on file    Relationship status: Not on file   Intimate partner violence    Fear of current or ex partner: Not on file    Emotionally abused: Not on file    Physically abused: Not on file    Forced sexual activity: Not on file  Other Topics Concern   Not on file  Social History Narrative   Not on file     History reviewed. No pertinent family history.   Current Facility-Administered Medications:    acetaminophen (TYLENOL) tablet 650 mg, 650 mg, Oral, Q6H PRN, 650 mg at 09/20/18 1944 **OR** acetaminophen (TYLENOL) suppository 650 mg, 650 mg, Rectal, Q6H PRN, Mody, Sital, MD   ceFEPIme (MAXIPIME) 2 g in sodium chloride 0.9 % 100 mL IVPB, 2 g, Intravenous, Q12H, Fritzi Mandes, MD   doxycycline (VIBRAMYCIN) 100 mg in sodium chloride 0.9 % 250 mL IVPB, 100 mg, Intravenous, Q12H, Ravishankar, Joellyn Quails, MD, Last Rate: 125 mL/hr at 09/21/18 1440, 100 mg at 09/21/18 1440   feeding supplement (ENSURE ENLIVE) (ENSURE ENLIVE) liquid 237 mL, 237 mL, Oral, TID BM, Fritzi Mandes, MD, 237 mL at 09/21/18 7628   HYDROcodone-acetaminophen (NORCO/VICODIN) 5-325 MG per tablet 1-2 tablet, 1-2 tablet, Oral, Q4H PRN, Benjie Karvonen, Sital, MD   lisinopril (ZESTRIL) tablet 20 mg, 20 mg, Oral, Daily, Fritzi Mandes, MD, 20 mg at 09/21/18 1441   multivitamin with minerals tablet 1 tablet, 1 tablet, Oral, Daily, Fritzi Mandes, MD, 1  tablet at 09/21/18 0905   ondansetron (ZOFRAN) tablet 4 mg, 4 mg, Oral, Q6H PRN **OR** ondansetron (ZOFRAN) injection 4 mg, 4 mg, Intravenous, Q6H PRN, Mody, Sital, MD   pantoprazole (PROTONIX) EC tablet 40 mg, 40 mg, Oral, BID, Fritzi Mandes, MD   phosphorus (K PHOS NEUTRAL) tablet 500 mg, 500 mg, Oral, Q4H, Patel, Kishan S, RPH, 500 mg at 09/21/18 1441   polyethylene glycol (MIRALAX / GLYCOLAX) packet 17 g, 17 g, Oral, Daily PRN, Bettey Costa, MD   [START ON 09/22/2018] vancomycin (VANCOCIN) 1,250 mg in sodium chloride 0.9 % 250 mL IVPB, 1,250 mg, Intravenous, Q24H, Ravishankar, Jayashree, MD   vancomycin (VANCOCIN) 1,500 mg in sodium chloride 0.9 % 500 mL IVPB, 1,500 mg, Intravenous, Once, Ravishankar, Joellyn Quails, MD, Last Rate: 250 mL/hr at 09/21/18 1656, 1,500 mg at 09/21/18 1656   vitamin C (ASCORBIC ACID) tablet 250 mg, 250 mg, Oral, BID, Fritzi Mandes, MD, 250 mg at  09/21/18 0905   Physical exam: Vitals:   09/20/18 2131 09/21/18 0331 09/21/18 0650 09/21/18 0731  BP:  140/84  135/85  Pulse: 100 (!) 111 93 92  Resp:  18  19  Temp: 100.3 F (37.9 C) 98.2 F (36.8 C)  98.6 F (37 C)  TempSrc:  Oral  Oral  SpO2:  92%  99%  Weight:      Height:       Physical Exam  Constitutional: He is oriented to person, place, and time. No distress.  Frail appearance, lying in the bed.  HENT:  Mouth/Throat: No oropharyngeal exudate.  Left frontal bruising  Eyes: Pupils are equal, round, and reactive to light. EOM are normal.  Neck: Normal range of motion. Neck supple.  Cardiovascular: Normal rate.  Pulmonary/Chest: Effort normal and breath sounds normal.  Abdominal: Soft. He exhibits no distension.  Musculoskeletal: Normal range of motion.        General: No edema.  Neurological: He is alert and oriented to person, place, and time. No cranial nerve deficit.  Skin: Skin is warm and dry.  Bruising on forehead  Psychiatric: Affect normal.        CMP Latest Ref Rng & Units 09/21/2018    Glucose 70 - 99 mg/dL -  BUN 8 - 23 mg/dL -  Creatinine 0.61 - 1.24 mg/dL 0.90  Sodium 135 - 145 mmol/L -  Potassium 3.5 - 5.1 mmol/L -  Chloride 98 - 111 mmol/L -  CO2 22 - 32 mmol/L -  Calcium 8.9 - 10.3 mg/dL -  Total Protein 6.5 - 8.1 g/dL -  Total Bilirubin 0.3 - 1.2 mg/dL -  Alkaline Phos 38 - 126 U/L -  AST 15 - 41 U/L -  ALT 0 - 44 U/L -   CBC Latest Ref Rng & Units 09/21/2018  WBC 4.0 - 10.5 K/uL 3.6(L)  Hemoglobin 13.0 - 17.0 g/dL 8.7(L)  Hematocrit 39.0 - 52.0 % 25.5(L)  Platelets 150 - 400 K/uL 52(L)   RADIOGRAPHIC STUDIES: I have personally reviewed the radiological images as listed and agreed with the findings in the report. Dg Chest 1 View  Result Date: 09/19/2018 CLINICAL DATA:  85 year old male with weakness and fever. COVID-19 status pending. EXAM: CHEST  1 VIEW COMPARISON:  CT Abdomen and Pelvis 01/20/2017. FINDINGS: Portable AP upright view at 1506 hours. Calcified aortic atherosclerosis. Other mediastinal contours are within normal limits. Visualized tracheal air column is within normal limits. Lung volumes are at the upper limits of normal. No pneumothorax, pleural effusion or pulmonary edema. No confluent pulmonary opacity. External button artifact suspected over the anterior 2nd rib on image #1. Chronic appearing posterior right rib fractures. Negative visible bowel gas pattern. IMPRESSION: No acute cardiopulmonary abnormality. Electronically Signed   By: Genevie Ann M.D.   On: 09/19/2018 15:39   Dg Pelvis 1-2 Views  Result Date: 09/20/2018 CLINICAL DATA:  Multiple falls.  Bilateral hip pain and weakness. EXAM: PELVIS - 1-2 VIEW COMPARISON:  CT 01/20/2017. FINDINGS: Diffuse osteopenia. Degenerative change both hips. No evidence of fracture or dislocation. A stable tiny sclerotic density noted in the left pubis consistent with a bone island. Pelvic calcifications consistent phleboliths. Peripheral vascular calcification. IMPRESSION: 1. Diffuse osteopenia. Degenerative  change both hips. No acute bony abnormality. 2.  Peripheral vascular disease. Electronically Signed   By: Marcello Moores  Register   On: 09/20/2018 09:56   Ct Head Wo Contrast  Result Date: 09/19/2018 CLINICAL DATA:  Pt states he fell and hit his  head on the bath tub today. Pt denies LOC EXAM: CT HEAD WITHOUT CONTRAST TECHNIQUE: Contiguous axial images were obtained from the base of the skull through the vertex without intravenous contrast. COMPARISON:  None. FINDINGS: Brain: No evidence of acute infarction, hemorrhage, extra-axial collection, ventriculomegaly, or mass effect. Generalized cerebral atrophy. Periventricular white matter low attenuation likely secondary to microangiopathy. Vascular: Cerebrovascular atherosclerotic calcifications are noted. Skull: Negative for fracture or focal lesion. Sinuses/Orbits: Visualized portions of the orbits are unremarkable. Visualized portions of the paranasal sinuses and mastoid air cells are unremarkable. Other: Large left frontal scalp hematoma. IMPRESSION: No acute intracranial pathology. Electronically Signed   By: Kathreen Devoid   On: 09/19/2018 15:42   Mr Brain Wo Contrast  Result Date: 09/21/2018 CLINICAL DATA:  Initial evaluation for acute speech difficulty, recent falls. EXAM: MRI HEAD WITHOUT CONTRAST TECHNIQUE: Multiplanar, multiecho pulse sequences of the brain and surrounding structures were obtained without intravenous contrast. COMPARISON:  Prior CT from 09/19/2018. FINDINGS: Brain: Generalized age-related cerebral atrophy with mild chronic small vessel ischemic disease. Remote lacunar infarct present at the right external capsule/lentiform nucleus. Small amount of associated chronic hemosiderin staining. There has been interval development of an acute subdural hematoma overlying the left cerebral convexity. This measures up to 8 mm in maximal diameter. Extension along the falx. Associated trace 2 mm left-to-right shift. No hydrocephalus or ventricular  trapping. Basilar cisterns remain patent. 4 mm focus of diffusion abnormality involving the left frontal cortex at the level of the operculum suspicious for a small acute ischemic infarct (series 7, image 22). No associated hemorrhage. No other evidence for acute or subacute ischemia. Gray-white matter differentiation otherwise maintained. Pituitary gland prominent with abnormal convex border superiorly (series 9, image 12). No other mass lesion. Midline structures intact. Vascular: Major intracranial vascular flow voids are maintained. Skull and upper cervical spine: Craniocervical junction within normal limits. Multilevel degenerative spondylolysis noted within the upper cervical spine without high-grade stenosis. Bone marrow signal intensity within normal limits. Evolving soft tissue contusion/hematoma at the left frontal scalp. Sinuses/Orbits: Globes and orbital soft tissues within normal limits. Paranasal sinuses are clear. Small right mastoid effusion noted, of doubtful significance. Inner ear structures grossly normal. Other: None. IMPRESSION: 1. Delayed intracranial hemorrhage, with interval development of acute left subdural hematoma, measuring up to 8 mm in maximal thickness. Associated mild mass effect with trace 2 mm left-to-right shift. 2. 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 3. Underlying age-related cerebral atrophy with mild chronic microvascular ischemic disease. 4. Abnormal in prominent appearance of the pituitary gland, raising the possibility for an underlying pituitary lesion. Correlation with laboratory values recommended. Additionally, further assessment with dedicated pituitary protocol MRI suggested for further evaluation. This could be performed on a nonemergent outpatient basis. Critical Value/emergent results were called by telephone at the time of interpretation on 09/21/2018 at 2:30 pm to Dr. Fritzi Mandes , who verbally acknowledged  these results. Electronically Signed   By: Jeannine Boga M.D.   On: 09/21/2018 14:37   US Abdomen Complete  Result Date: 09/20/2018 CLINICAL DATA:  Elevated liver function tests. EXAM: ABDOMEN ULTRASOUND COMPLETE COMPARISON:  CT abdomen and pelvis 01/20/2017. FINDINGS: Gallbladder: No gallstones or wall thickening visualized. No sonographic Murphy sign noted by sonographer. Common bile duct: Diameter: 0.2 cm Liver: No focal lesion identified. Within normal limits in parenchymal echogenicity. Portal vein is patent on color Doppler imaging with normal direction of blood flow towards the liver. IVC: No abnormality visualized. Pancreas: Visualized  portion unremarkable. Spleen: Size and appearance within normal limits. A few small calcifications in the spleen consistent with old granulomatous disease are seen as on prior CT. Right Kidney: Length: 12.5 cm. Echogenicity within normal limits. No solid mass or hydronephrosis visualized. 7.4 cm in diameter cyst is identified as seen on prior CT. Calcification in the wall of this cyst is unchanged. Left Kidney: Length: 12.5 cm. Echogenicity within normal limits. No solid mass or hydronephrosis visualized. Two simple cysts measuring 2.0 and 1.8 cm in diameter are unchanged. A 1.5 cm stone in the mid to lower pole is identified and was present on the prior CT. Abdominal aorta: No aneurysm visualized.  Atherosclerosis noted. Other findings: None. IMPRESSION: No acute abnormality.  Normal-appearing liver and gallbladder. Nonobstructing stone lower pole left kidney. Bilateral renal cysts. Atherosclerosis. Electronically Signed   By: Inge Rise M.D.   On: 09/20/2018 10:24    Assessment and plan- Patient is a 85 y.o. male with history of hypertension, history of prostate cancer, presented for evaluation of multiple falls and generalized weakness.  Found to be febrile in the emergency room.  #Thrombocytopenia and anemia, no recent baselines.  Normal folate,    Hepatitis and B12 are pending.  Increased LDH, haptoglobin pending.  Fibrinogen 144 slightly low. He had a normal platelet counts in 2018.  No recent baselines. Smears were reviewed independently and also discussed with pathologist. No fragmental RBCs, mild lymphopenia, no abnormal or immature neutrophils.  Less likely TTP Rare large lymphoid cells, questionable plasma cytoid appearance.  Questionable reactive versus underlying disorders. increased immature platelet fraction, most likely due to peripheral destruction. Further  plan pending additional lab results.   #AKI in the setting of dehydration.  IV fluid. #Elevated LFTs, ultrasound abdomen showed no hepatosplenomegaly, nonobstructing left kidney stone Hepatitis pending.  Can be due to chronic alcohol use.  #Febrile illness, work-up in progress.  1 out of 2 blood culture bottle grew gram-positive rods. #Intracranial hematoma/bleeding, secondary to injury.  Appreciate neurosurgery evaluation. Acute infarction    Thank you for allowing me to participate in the care of this patient.  Total face to face encounter time for this patient visit was 70 min. >50% of the time was  spent in counseling and coordination of care.    Earlie Server, MD, PhD Hematology Oncology [facility] Pager- [phone] 09/21/2018

## 2018-09-21 NOTE — Consult Note (Signed)
NAME: [name]  DOB: 22-Jul-1933  MRN: [id number]  Date/Time: 09/21/2018 11:23 AM  REQUESTING PROVIDER: Posey Pronto Subjective:  REASON FOR CONSULT: Fever ?History from patient and also from daughter [name] is a 85 y.o. male with a history of HTN, prostate CA status post cryoablationPresented on 09/19/2018 to the ED via EMS with history of fall.  Patient reported dizziness and  falling 4 times  same day.  EMS noted he had orthostasis and they gave him 250 cc of normal saline..   he was noted to have large  bruising on the left side of his forehead and skin tear.  In The ED his blood pressure was 96/55, heart rate of 98, temperature of 103.2, respiratory rate of 24 and pulse ox of 98% with a weight of 61.2 kg. Labs revealed a WBC of 4.5, hemoglobin of 9.9 And platelet of 57.  He had creatinine of 1.81, potassium of 2.5 and sodium of 136.  His AST was 158, ALT 74 total bilirubin was 0.9.  His lactate was 1.8. He was started on cefepime and I am seeing the aptient for persistent fever PT says he lives on his own- has a dog . He has been pretty much inside his house and not traveled anywhere except to go [place] and occasionally to [employer]. As per patient he has been feeling dizzy for nearly 4 days and having pain in his hips and knees. He fell 4 times and passe dout once when he hit his head on the bath tube. He did wake up and was able to get up and called his daughter who came and called EMS. He did not feel hot or record a temperature at home. He has lost nearly 25 pounds in 1 year/ His appetite is okay On questioning patient  Says he mows his lawn and does yard work and has removed ticks from his ankles. In the past 2-3 weeks Past Medical History:  Diagnosis Date  . Arthritis   . Cancer [facility])    Prostate Cancer  . Hypertension     Past Surgical History:  Procedure Laterality Date  . ESOPHAGOGASTRODUODENOSCOPY (EGD) WITH PROPOFOL N/A 12/06/2017   Procedure:  ESOPHAGOGASTRODUODENOSCOPY (EGD) WITH PROPOFOL;  Surgeon: Jonathon Bellows, MD;  Location: [facility] ENDOSCOPY;  Service: Gastroenterology;  Laterality: N/A;  . PROSTATE CRYOABLATION     SH Former smoker Drink a can of beer   History reviewed. No pertinent family history. No Known Allergies  ? Current Facility-Administered Medications  Medication Dose Route Frequency Provider Last Rate Last Dose  . acetaminophen (TYLENOL) tablet 650 mg  650 mg Oral Q6H PRN Bettey Costa, MD   650 mg at 09/20/18 1944   Or  . acetaminophen (TYLENOL) suppository 650 mg  650 mg Rectal Q6H PRN Bettey Costa, MD      . aspirin EC tablet 325 mg  325 mg Oral Q4H PRN Mody, Sital, MD      . ceFEPIme (MAXIPIME) 2 g in sodium chloride 0.9 % 100 mL IVPB  2 g Intravenous Q24H Hallaji, Sheema M, RPH 200 mL/hr at 09/21/18 0455 2 g at 09/21/18 0455  . feeding supplement (ENSURE ENLIVE) (ENSURE ENLIVE) liquid 237 mL  237 mL Oral TID BM Fritzi Mandes, MD   237 mL at 09/21/18 0911  . HYDROcodone-acetaminophen (NORCO/VICODIN) 5-325 MG per tablet 1-2 tablet  1-2 tablet Oral Q4H PRN Bettey Costa, MD      . multivitamin with minerals tablet 1 tablet  1 tablet  Oral Daily Fritzi Mandes, MD   1 tablet at 09/21/18 [phone]  . ondansetron (ZOFRAN) tablet 4 mg  4 mg Oral Q6H PRN Bettey Costa, MD       Or  . ondansetron (ZOFRAN) injection 4 mg  4 mg Intravenous Q6H PRN Mody, Sital, MD      . pantoprazole (PROTONIX) EC tablet 40 mg  40 mg Oral BID Fritzi Mandes, MD      . phosphorus (K PHOS NEUTRAL) tablet 500 mg  500 mg Oral Q4H Oswald Hillock, RPH   500 mg at 09/21/18 0905  . polyethylene glycol (MIRALAX / GLYCOLAX) packet 17 g  17 g Oral Daily PRN Mody, Sital, MD      . potassium chloride SA (K-DUR) CR tablet 40 mEq  40 mEq Oral Q4H Oswald Hillock, RPH   40 mEq at 09/21/18 0905  . vitamin C (ASCORBIC ACID) tablet 250 mg  250 mg Oral BID Fritzi Mandes, MD   250 mg at 09/21/18 2094     Abtx:  Anti-infectives (From admission, onward)   Start     Dose/Rate  Route Frequency Ordered Stop   09/20/18 0600  ceFEPIme (MAXIPIME) 2 g in sodium chloride 0.9 % 100 mL IVPB     2 g 200 mL/hr over 30 Minutes Intravenous Every 24 hours 09/20/18 0512        REVIEW OF SYSTEMS:  Const: negative fever, negative chills, ++ weight loss Eyes: negative diplopia or visual changes, negative eye pain ENT: negative coryza, negative sore throat Resp: negative cough, hemoptysis, dyspnea Cards: negative for chest pain, palpitations, lower extremity edema GU: negative for frequency, , has urgency, no dysuria and hematuria GI: Negative for abdominal pain, diarrhea, has had dark stooks, no constipation Skin: negative for rash and pruritus Heme: bruising  MS: myalgias, arthralgias, back pain and muscle weakness Neurolo:dizziness , syncope, falls Psych: negative for feelings of anxiety, depression  Endocrine: no polyuria or polydipsia Allergy/Immunology- as above Objective:  VITALS:  BP 135/85 (BP Location: Left Arm)   Pulse 92   Temp 98.6 F (37 C) (Oral)   Resp 19   Ht 6' (1.829 m)   Wt 60.5 kg   SpO2 99%   BMI 18.09 kg/m  PHYSICAL EXAM:  General: Alert, cooperative, frail speech slurred ( but missing lower dentures), Head: Normocephalic, without obvious abnormality, left scalp and forehead ecchymosis, skin tear. Eyes: Conjunctivae clear, anicteric sclerae. Pupils are equal ENT Nares normal. No drainage or sinus tenderness. Lips, mucosa, and tongue normal. No Thrush Neck: Supple, symmetrical, no adenopathy, thyroid: non tender no carotid bruit and no JVD. Back: No CVA tenderness. Lungs: b/l air entry Heart: s1s2 Abdomen: Soft, non-tender,not distended. Bowel sounds normal. No masses Extremities: brusing Skin: No rashes or lesions. Or bruising Lymph: Cervical, supraclavicular normal. Neurologic: Grossly non-focal Pertinent Labs Lab Results CBC    Component Value Date/Time   WBC 3.6 (L) 09/21/2018 0654   RBC 2.92 (L) 09/21/2018 0654   HGB 8.7 (L)  09/21/2018 0654   HCT 25.5 (L) 09/21/2018 0654   PLT 52 (L) 09/21/2018 0654   MCV 87.3 09/21/2018 0654   MCH 29.8 09/21/2018 0654   MCHC 34.1 09/21/2018 0654   RDW 13.4 09/21/2018 0654   LYMPHSABS 0.2 (L) 09/19/2018 1535   MONOABS 0.1 09/19/2018 1535   EOSABS 0.0 09/19/2018 1535   BASOSABS 0.0 09/19/2018 1535    CMP Latest Ref Rng & Units 09/21/2018 09/20/2018 09/19/2018  Glucose 70 - 99 mg/dL 103(H) 105(H) -  BUN 8 - 23 mg/dL 38(H) 40(H) -  Creatinine 0.61 - 1.24 mg/dL 0.91 1.32(H) -  Sodium 135 - 145 mmol/L 141 140 -  Potassium 3.5 - 5.1 mmol/L 3.0(L) 3.3(L) 3.1(L)  Chloride 98 - 111 mmol/L 113(H) 108 -  CO2 22 - 32 mmol/L 19(L) 19(L) -  Calcium 8.9 - 10.3 mg/dL 7.4(L) 7.9(L) -  Total Protein 6.5 - 8.1 g/dL - - -  Total Bilirubin 0.3 - 1.2 mg/dL - - -  Alkaline Phos 38 - 126 U/L - - -  AST 15 - 41 U/L - - -  ALT 0 - 44 U/L - - -      Microbiology: Recent Results (from the past 240 hour(s))  Blood culture (routine x 2)     Status: None (Preliminary result)   Collection Time: 09/19/18  3:35 PM   Specimen: BLOOD  Result Value Ref Range Status   Specimen Description BLOOD RIGHT ANTECUBITAL  Final   Special Requests   Final    BOTTLES DRAWN AEROBIC AND ANAEROBIC Blood Culture adequate volume   Culture   Final    NO GROWTH 2 DAYS Performed at [facility], [address], [place], [place] [address]    Report Status PENDING  Incomplete  Urine Culture     Status: None   Collection Time: 09/19/18  3:35 PM   Specimen: Urine, Random  Result Value Ref Range Status   Specimen Description   Final    URINE, RANDOM Performed at [facility], [address] [place], [place] [address]    Special Requests   Final    Normal Performed at [facility], [address], [place], [place] [address]    Culture   Final    NO GROWTH Performed at [facility], [place] [address] [place], [place] [address]    Report Status 09/20/2018 FINAL  Final  Blood  culture (routine x 2)     Status: None (Preliminary result)   Collection Time: 09/19/18  3:36 PM   Specimen: BLOOD  Result Value Ref Range Status   Specimen Description BLOOD LEFT ANTECUBITAL  Final   Special Requests   Final    BOTTLES DRAWN AEROBIC AND ANAEROBIC Blood Culture adequate volume   Culture  Setup Time   Final    Organism ID to follow AEROBIC BOTTLE ONLY GRAM POSITIVE RODS CRITICAL RESULT CALLED TO, READ BACK BY AND VERIFIED WITH: [facility] HALLAJI AT 6237 ON 09/20/2018 JJB Performed at [facility], [address], [place],  [address]    Culture GRAM POSITIVE RODS  Final   Report Status PENDING  Incomplete  SARS Coronavirus 2 (CEPHEID- Performed in [place] hospital lab), Hosp Order     Status: None   Collection Time: 09/19/18  3:36 PM   Specimen: Nasopharyngeal Swab  Result Value Ref Range Status   SARS Coronavirus 2 NEGATIVE NEGATIVE Final    Comment: (NOTE) If result is NEGATIVE SARS-CoV-2 target nucleic acids are NOT DETECTED. The SARS-CoV-2 RNA is generally detectable in upper and lower  respiratory specimens during the acute phase of infection. The lowest  concentration of SARS-CoV-2 viral copies this assay can detect is 250  copies / mL. A negative result does not preclude SARS-CoV-2 infection  and should not be used as the sole basis for treatment or other  patient management decisions.  A negative result may occur with  improper specimen collection / handling, submission of specimen other  than nasopharyngeal swab,  presence of viral mutation(s) within the  areas targeted by this assay, and inadequate number of viral copies  (<250 copies / mL). A negative result must be combined with clinical  observations, patient history, and epidemiological information. If result is POSITIVE SARS-CoV-2 target nucleic acids are DETECTED. The SARS-CoV-2 RNA is generally detectable in upper and lower  respiratory specimens dur ing the acute phase of  infection.  Positive  results are indicative of active infection with SARS-CoV-2.  Clinical  correlation with patient history and other diagnostic information is  necessary to determine patient infection status.  Positive results do  not rule out bacterial infection or co-infection with other viruses. If result is PRESUMPTIVE POSTIVE SARS-CoV-2 nucleic acids MAY BE PRESENT.   A presumptive positive result was obtained on the submitted specimen  and confirmed on repeat testing.  While 2019 novel coronavirus  (SARS-CoV-2) nucleic acids may be present in the submitted sample  additional confirmatory testing may be necessary for epidemiological  and / or clinical management purposes  to differentiate between  SARS-CoV-2 and other Sarbecovirus currently known to infect humans.  If clinically indicated additional testing with an alternate test  methodology [phone]) is advised. The SARS-CoV-2 RNA is generally  detectable in upper and lower respiratory sp ecimens during the acute  phase of infection. The expected result is Negative. Fact Sheet for Patients:  StrictlyIdeas.no Fact Sheet for Healthcare Providers: BankingDealers.co.za This test is not yet approved or cleared by the [place] FDA and has been authorized for detection and/or diagnosis of SARS-CoV-2 by FDA under an Emergency Use Authorization (EUA).  This EUA will remain in effect (meaning this test can be used) for the duration of the COVID-19 declaration under Section 564(b)(1) of the Act, 21 U.S.C. section 360bbb-3(b)(1), unless the authorization is terminated or revoked sooner. Performed at [facility], [address] [place], [place] [address]   Blood Culture ID Panel (Reflexed)     Status: None   Collection Time: 09/19/18  3:36 PM  Result Value Ref Range Status   Enterococcus species NOT DETECTED NOT DETECTED Final   Listeria monocytogenes NOT DETECTED NOT  DETECTED Final   Staphylococcus species NOT DETECTED NOT DETECTED Final   Staphylococcus aureus (BCID) NOT DETECTED NOT DETECTED Final   Streptococcus species NOT DETECTED NOT DETECTED Final   Streptococcus agalactiae NOT DETECTED NOT DETECTED Final   Streptococcus pneumoniae NOT DETECTED NOT DETECTED Final   Streptococcus pyogenes NOT DETECTED NOT DETECTED Final   Acinetobacter baumannii NOT DETECTED NOT DETECTED Final   Enterobacteriaceae species NOT DETECTED NOT DETECTED Final   Enterobacter cloacae complex NOT DETECTED NOT DETECTED Final   Escherichia coli NOT DETECTED NOT DETECTED Final   Klebsiella oxytoca NOT DETECTED NOT DETECTED Final   Klebsiella pneumoniae NOT DETECTED NOT DETECTED Final   Proteus species NOT DETECTED NOT DETECTED Final   Serratia marcescens NOT DETECTED NOT DETECTED Final   Haemophilus influenzae NOT DETECTED NOT DETECTED Final   Neisseria meningitidis NOT DETECTED NOT DETECTED Final   Pseudomonas aeruginosa NOT DETECTED NOT DETECTED Final   Candida albicans NOT DETECTED NOT DETECTED Final   Candida glabrata NOT DETECTED NOT DETECTED Final   Candida krusei NOT DETECTED NOT DETECTED Final   Candida parapsilosis NOT DETECTED NOT DETECTED Final   Candida tropicalis NOT DETECTED NOT DETECTED Final    Comment: Performed at [facility], [address] [place], [place] [address]    IMAGING RESULTS: CT head Large left frontal scalp hematoma. I have personally  reviewed the films ? Impression/Recommendation Pt admitted with syncope, fall, trauma   Syncope could be from postural hypotension .acute anemia, need to r/o cardiac cause   Fever with pancytopenia- wit h/o tick exposure ehrlichia/RMSF to e considered but anemia is not an usual feature and this makes it less likely.  Gram positive rod in 1 of 4 blood culture- could be contaminant- but until we get the results back will add vanco to cefepime and also add doxy  With pancytopenia/ bite cells  in the smear  / abnormal LFTS need to r/o  hemolysis,  MDS, HLH, malignancy including bone mets. TTP less likely Will send LDH, B12, Folate, ferritin, peripheral smear He needs heme onc consult  H/o prostate ca- had cryoablation- may check PSA R/o mets ? ? ? ___________________________________________________ Discussed with patient, requesting provider, patients daughter Note:  This document was prepared using Dragon voice recognition software and may include unintentional dictation errors.

## 2018-09-21 NOTE — TOC Progression Note (Signed)
Transition of Care [facility]) - Progression Note    Patient Details  Name: [name] MRN: [id number] Date of Birth: [birth year]  [facility] CM/SW Contact  Latanya Maudlin, RN Phone Number: 09/21/2018, 8:56 AM  Clinical Narrative:   PT recommending outpatient PT. Patient agreeable and without preference of location. Will fill out form for [facility] rehab and fax it to them to make appointment. Patient has rolling walker and bedside commode although he reports he does not typically need it. Patient still drives but his daughter is also available to take him to appointments. No further TOC team needs       Barriers to Discharge: No Barriers Identified  Expected Discharge Plan and Services                                                 Social Determinants of Health (SDOH) Interventions    Readmission Risk Interventions Readmission Risk Prevention Plan 09/21/2018  Post Dischage Appt Complete  Medication Screening Complete  Transportation Screening Complete  Some recent data might be hidden

## 2018-09-21 NOTE — Consult Note (Addendum)
PHARMACY CONSULT NOTE - FOLLOW UP  Pharmacy Consult for Electrolyte Monitoring and Replacement   Recent Labs: Potassium (mmol/L)  Date Value  09/21/2018 3.0 (L)   Magnesium (mg/dL)  Date Value  09/20/2018 1.9   Calcium (mg/dL)  Date Value  09/21/2018 7.4 (L)   Albumin (g/dL)  Date Value  09/19/2018 3.0 (L)   Phosphorus (mg/dL)  Date Value  09/21/2018 2.0 (L)   Sodium (mmol/L)  Date Value  09/21/2018 141   Corrected Ca: 8.7 Scr 1.81>0.91  Assessment: Patient admitted with  Hypokalemia. Pharmacy consulted for electrolyte monitoring and replacement.   Goal of Therapy:  Electrolytes WNL  Plan:  K+ 3.0. Will order KCL 25mEq PO x 2 dose.  Phos 2.0. Will order Kphos neutral tablets 2 tab every 4 hours x 4.   Will F/U with AM labs and continue to replace electrolytes as needed.   Oswald Hillock, PharmD, BCPS Clinical Pharmacist 09/21/2018 7:40 AM

## 2018-09-21 NOTE — Consult Note (Signed)
Reason for Consult: Syncope falls Referring Physician: Referring physician Dr. Benita Stabile Dr. Benjaman Pott is an 85 y.o. male.  HPI: Patient history of hypertension presents to emergency room after multiple falls at home patient had for the past 3 days felt dizzy weak lightheaded and had multiple falls injuring his forehead and his arm patient came to the emergency room got rehydrated was found to be significantly dehydrated had low-grade fever and borderline troponin these had decreased appetite and decreased p.o. intake now here for further cardiac assessment denies any significant chest pain denies any palpitations or tachycardia.  Past Medical History:  Diagnosis Date  . Arthritis   . Cancer [facility])    Prostate Cancer  . Hypertension     Past Surgical History:  Procedure Laterality Date  . ESOPHAGOGASTRODUODENOSCOPY (EGD) WITH PROPOFOL N/A 12/06/2017   Procedure: ESOPHAGOGASTRODUODENOSCOPY (EGD) WITH PROPOFOL;  Surgeon: Jonathon Bellows, MD;  Location: [facility] ENDOSCOPY;  Service: Gastroenterology;  Laterality: N/A;  . PROSTATE CRYOABLATION      History reviewed. No pertinent family history.  Social History:  reports that he quit smoking about 16 years ago. His smoking use included pipe. He has never used smokeless tobacco. He reports current alcohol use. He reports that he does not use drugs.  Allergies: No Known Allergies  Medications: I have reviewed the patient's current medications.  Results for orders placed or performed during the hospital encounter of 09/19/18 (from the past 48 hour(s))  Glucose, capillary     Status: Abnormal   Collection Time: 09/19/18  3:20 PM  Result Value Ref Range   Glucose-Capillary 148 (H) 70 - 99 mg/dL  CBC with Differential     Status: Abnormal   Collection Time: 09/19/18  3:35 PM  Result Value Ref Range   WBC 4.5 4.0 - 10.5 K/uL   RBC 3.31 (L) 4.22 - 5.81 MIL/uL   Hemoglobin 9.9 (L) 13.0 - 17.0 g/dL   HCT 28.5 (L) 39.0 - 52.0 %   MCV  86.1 80.0 - 100.0 fL   MCH 29.9 26.0 - 34.0 pg   MCHC 34.7 30.0 - 36.0 g/dL   RDW 13.0 11.5 - 15.5 %   Platelets 57 (L) 150 - 400 K/uL    Comment: Immature Platelet Fraction may be clinically indicated, consider ordering this additional test [id number]    nRBC 0.0 0.0 - 0.2 %   Neutrophils Relative % 92 %   Neutro Abs 4.1 1.7 - 7.7 K/uL   Lymphocytes Relative 5 %   Lymphs Abs 0.2 (L) 0.7 - 4.0 K/uL   Monocytes Relative 3 %   Monocytes Absolute 0.1 0.1 - 1.0 K/uL   Eosinophils Relative 0 %   Eosinophils Absolute 0.0 0.0 - 0.5 K/uL   Basophils Relative 0 %   Basophils Absolute 0.0 0.0 - 0.1 K/uL   Smear Review Normal platelet morphology    Abs Immature Granulocytes 0.00 0.00 - 0.07 K/uL   Abnormal Lymphocytes Present PRESENT    Bite Cells PRESENT     Comment: Performed at [facility], [address] [place], [place] [address]  Comprehensive metabolic panel     Status: Abnormal   Collection Time: 09/19/18  3:35 PM  Result Value Ref Range   Sodium 136 135 - 145 mmol/L   Potassium 2.5 (LL) 3.5 - 5.1 mmol/L    Comment: CRITICAL RESULT CALLED TO, READ BACK BY AND VERIFIED WITH JESSICA FULCHER @1707  09/19/18 MJU    Chloride 105 98 - 111 mmol/L  CO2 20 (L) 22 - 32 mmol/L   Glucose, Bld 145 (H) 70 - 99 mg/dL   BUN 46 (H) 8 - 23 mg/dL   Creatinine, Ser 1.81 (H) 0.61 - 1.24 mg/dL   Calcium 7.9 (L) 8.9 - 10.3 mg/dL   Total Protein 5.5 (L) 6.5 - 8.1 g/dL   Albumin 3.0 (L) 3.5 - 5.0 g/dL   AST 158 (H) 15 - 41 U/L   ALT 74 (H) 0 - 44 U/L   Alkaline Phosphatase 76 38 - 126 U/L   Total Bilirubin 0.9 0.3 - 1.2 mg/dL   GFR calc non Af Amer 34 (L) >60 mL/min   GFR calc Af Amer 39 (L) >60 mL/min   Anion gap 11 5 - 15    Comment: Performed at [facility], [address] [place], [place] [address]  Urinalysis, Complete w Microscopic     Status: Abnormal   Collection Time: 09/19/18  3:35 PM  Result Value Ref Range   Color, Urine YELLOW (A) YELLOW   APPearance CLOUDY  (A) CLEAR   Specific Gravity, Urine 1.010 1.005 - 1.030   pH 5.0 5.0 - 8.0   Glucose, UA NEGATIVE NEGATIVE mg/dL   Hgb urine dipstick LARGE (A) NEGATIVE   Bilirubin Urine NEGATIVE NEGATIVE   Ketones, ur NEGATIVE NEGATIVE mg/dL   Protein, ur 30 (A) NEGATIVE mg/dL   Nitrite NEGATIVE NEGATIVE   Leukocytes,Ua NEGATIVE NEGATIVE   RBC / HPF >50 (H) 0 - 5 RBC/hpf   WBC, UA 0-5 0 - 5 WBC/hpf   Bacteria, UA RARE (A) NONE SEEN   Squamous Epithelial / LPF 0-5 0 - 5   Mucus PRESENT     Comment: Performed at [facility], [address] [place], [place] [address]  Blood culture (routine x 2)     Status: None (Preliminary result)   Collection Time: 09/19/18  3:35 PM   Specimen: BLOOD  Result Value Ref Range   Specimen Description BLOOD RIGHT ANTECUBITAL    Special Requests      BOTTLES DRAWN AEROBIC AND ANAEROBIC Blood Culture adequate volume   Culture      NO GROWTH 2 DAYS Performed at [facility], [address] [place], [place] [address]    Report Status PENDING   Urine Culture     Status: None   Collection Time: 09/19/18  3:35 PM   Specimen: Urine, Random  Result Value Ref Range   Specimen Description      URINE, RANDOM Performed at [facility], [address] [place], [place] [address]    Special Requests      Normal Performed at [facility], [address] [place], [place] [address]    Culture      NO GROWTH Performed at [facility], [address], [place], [place] [address]    Report Status 09/20/2018 FINAL   Troponin I - ONCE - STAT     Status: Abnormal   Collection Time: 09/19/18  3:35 PM  Result Value Ref Range   Troponin I 0.19 (HH) <0.03 ng/mL    Comment: CRITICAL RESULT CALLED TO, READ BACK BY AND VERIFIED WITH JESSICA FULCHER @1707  09/19/18 MJU Performed at [facility], [address] [place], [place] [address]   Blood culture (routine x 2)     Status: None (Preliminary result)   Collection Time:  09/19/18  3:36 PM   Specimen: BLOOD  Result Value Ref Range   Specimen Description  BLOOD LEFT ANTECUBITAL Performed at [facility], [address] [place], [place] [address]    Special Requests      BOTTLES DRAWN AEROBIC AND ANAEROBIC Blood Culture adequate volume Performed at [facility], [address] [place], [place] [address]    Culture  Setup Time      AEROBIC BOTTLE ONLY GRAM POSITIVE RODS CRITICAL RESULT CALLED TO, READ BACK BY AND VERIFIED WITH: SHEEMA HALLAJI AT 2536 ON 09/20/2018 JJB    Culture      GRAM POSITIVE RODS IDENTIFICATION TO FOLLOW Performed at [facility], [place] [address] [place], [place] [address]    Report Status PENDING   Lactic acid, plasma     Status: None   Collection Time: 09/19/18  3:36 PM  Result Value Ref Range   Lactic Acid, Venous 1.8 0.5 - 1.9 mmol/L    Comment: Performed at [facility], [address], [place], [place] [address]  SARS Coronavirus 2 (CEPHEID- Performed in [place] hospital lab), Hosp Order     Status: None   Collection Time: 09/19/18  3:36 PM   Specimen: Nasopharyngeal Swab  Result Value Ref Range   SARS Coronavirus 2 NEGATIVE NEGATIVE    Comment: (NOTE) If result is NEGATIVE SARS-CoV-2 target nucleic acids are NOT DETECTED. The SARS-CoV-2 RNA is generally detectable in upper and lower  respiratory specimens during the acute phase of infection. The lowest  concentration of SARS-CoV-2 viral copies this assay can detect is 250  copies / mL. A negative result does not preclude SARS-CoV-2 infection  and should not be used as the sole basis for treatment or other  patient management decisions.  A negative result may occur with  improper specimen collection / handling, submission of specimen other  than nasopharyngeal swab, presence of viral mutation(s) within the  areas targeted by this assay, and inadequate number of viral copies  (<250 copies / mL). A negative result must be  combined with clinical  observations, patient history, and epidemiological information. If result is POSITIVE SARS-CoV-2 target nucleic acids are DETECTED. The SARS-CoV-2 RNA is generally detectable in upper and lower  respiratory specimens dur ing the acute phase of infection.  Positive  results are indicative of active infection with SARS-CoV-2.  Clinical  correlation with patient history and other diagnostic information is  necessary to determine patient infection status.  Positive results do  not rule out bacterial infection or co-infection with other viruses. If result is PRESUMPTIVE POSTIVE SARS-CoV-2 nucleic acids MAY BE PRESENT.   A presumptive positive result was obtained on the submitted specimen  and confirmed on repeat testing.  While 2019 novel coronavirus  (SARS-CoV-2) nucleic acids may be present in the submitted sample  additional confirmatory testing may be necessary for epidemiological  and / or clinical management purposes  to differentiate between  SARS-CoV-2 and other Sarbecovirus currently known to infect humans.  If clinically indicated additional testing with an alternate test  methodology [phone]) is advised. The SARS-CoV-2 RNA is generally  detectable in upper and lower respiratory sp ecimens during the acute  phase of infection. The expected result is Negative. Fact Sheet for Patients:  StrictlyIdeas.no Fact Sheet for Healthcare Providers: BankingDealers.co.za This test is not yet approved or cleared by the [place] FDA and has been authorized for detection and/or diagnosis of SARS-CoV-2 by FDA under an Emergency Use Authorization (EUA).  This EUA will remain in effect (meaning this test can be used) for the duration of the  COVID-19 declaration under Section 564(b)(1) of the Act, 21 U.S.C. section 360bbb-3(b)(1), unless the authorization is terminated or revoked sooner. Performed at [facility], [address] [place], [place] [address]   Blood Culture ID Panel (Reflexed)     Status: None   Collection Time: 09/19/18  3:36 PM  Result Value Ref Range   Enterococcus species NOT DETECTED NOT DETECTED   Listeria monocytogenes NOT DETECTED NOT DETECTED   Staphylococcus species NOT DETECTED NOT DETECTED   Staphylococcus aureus (BCID) NOT DETECTED NOT DETECTED   Streptococcus species NOT DETECTED NOT DETECTED   Streptococcus agalactiae NOT DETECTED NOT DETECTED   Streptococcus pneumoniae NOT DETECTED NOT DETECTED   Streptococcus pyogenes NOT DETECTED NOT DETECTED   Acinetobacter baumannii NOT DETECTED NOT DETECTED   Enterobacteriaceae species NOT DETECTED NOT DETECTED   Enterobacter cloacae complex NOT DETECTED NOT DETECTED   Escherichia coli NOT DETECTED NOT DETECTED   Klebsiella oxytoca NOT DETECTED NOT DETECTED   Klebsiella pneumoniae NOT DETECTED NOT DETECTED   Proteus species NOT DETECTED NOT DETECTED   Serratia marcescens NOT DETECTED NOT DETECTED   Haemophilus influenzae NOT DETECTED NOT DETECTED   Neisseria meningitidis NOT DETECTED NOT DETECTED   Pseudomonas aeruginosa NOT DETECTED NOT DETECTED   Candida albicans NOT DETECTED NOT DETECTED   Candida glabrata NOT DETECTED NOT DETECTED   Candida krusei NOT DETECTED NOT DETECTED   Candida parapsilosis NOT DETECTED NOT DETECTED   Candida tropicalis NOT DETECTED NOT DETECTED    Comment: Performed at [facility], [address] [place], [place] [address]  Troponin I - Now Then Q6H     Status: Abnormal   Collection Time: 09/19/18  6:15 PM  Result Value Ref Range   Troponin I 0.17 (HH) <0.03 ng/mL    Comment: CRITICAL VALUE NOTED. VALUE IS CONSISTENT WITH PREVIOUSLY REPORTED/CALLED VALUE MJU Performed at [facility], [address] [place], [place] [address]   Troponin I - Now Then Q6H     Status: Abnormal   Collection Time: 09/19/18 11:25 PM  Result Value Ref Range   Troponin I 0.17 (HH) <0.03  ng/mL    Comment: CRITICAL VALUE NOTED. VALUE IS CONSISTENT WITH PREVIOUSLY REPORTED/CALLED VALUE JJB Performed at [facility], [address] [place], [place] [address]   Potassium     Status: Abnormal   Collection Time: 09/19/18 11:25 PM  Result Value Ref Range   Potassium 3.1 (L) 3.5 - 5.1 mmol/L    Comment: Performed at [facility], [address] [place], [place] [address]  Occult blood card to lab, stool RN will collect     Status: Abnormal   Collection Time: 09/20/18  1:04 AM  Result Value Ref Range   Fecal Occult Bld POSITIVE (A) NEGATIVE    Comment: Performed at [facility], [address] [place], [place] [address]  Troponin I - Now Then Q6H     Status: Abnormal   Collection Time: 09/20/18  5:33 AM  Result Value Ref Range   Troponin I 0.15 (HH) <0.03 ng/mL    Comment: CRITICAL VALUE NOTED. VALUE IS CONSISTENT WITH PREVIOUSLY REPORTED/CALLED VALUE JJB Performed at [facility], [address] [place],  [address]   Basic metabolic panel     Status: Abnormal   Collection Time: 09/20/18  5:33 AM  Result Value Ref Range   Sodium 140 135 - 145 mmol/L   Potassium 3.3 (L) 3.5 - 5.1 mmol/L   Chloride 108 98 - 111  mmol/L   CO2 19 (L) 22 - 32 mmol/L   Glucose, Bld 105 (H) 70 - 99 mg/dL   BUN 40 (H) 8 - 23 mg/dL   Creatinine, Ser 1.32 (H) 0.61 - 1.24 mg/dL   Calcium 7.9 (L) 8.9 - 10.3 mg/dL   GFR calc non Af Amer 50 (L) >60 mL/min   GFR calc Af Amer 58 (L) >60 mL/min   Anion gap 13 5 - 15    Comment: Performed at [facility], [address] [place], [place] [address]  Magnesium     Status: None   Collection Time: 09/20/18  5:33 AM  Result Value Ref Range   Magnesium 1.9 1.7 - 2.4 mg/dL    Comment: Performed at [facility], [address] [place], [place] [address]  Phosphorus     Status: Abnormal   Collection Time: 09/20/18  5:33 AM  Result Value Ref Range   Phosphorus 2.3 (L) 2.5 - 4.6 mg/dL     Comment: Performed at [facility], [address] [place], [place] [address]  Hemoglobin     Status: Abnormal   Collection Time: 09/20/18  5:33 AM  Result Value Ref Range   Hemoglobin 10.0 (L) 13.0 - 17.0 g/dL    Comment: Performed at [facility], [address] [place], [place] [address]  Glucose, capillary     Status: None   Collection Time: 09/20/18  7:52 AM  Result Value Ref Range   Glucose-Capillary 99 70 - 99 mg/dL  Glucose, capillary     Status: Abnormal   Collection Time: 09/20/18 12:08 PM  Result Value Ref Range   Glucose-Capillary 67 (L) 70 - 99 mg/dL  Phosphorus     Status: Abnormal   Collection Time: 09/21/18  6:54 AM  Result Value Ref Range   Phosphorus 2.0 (L) 2.5 - 4.6 mg/dL    Comment: Performed at [facility], [address] [place], [place] [address]  Basic metabolic panel     Status: Abnormal   Collection Time: 09/21/18  6:54 AM  Result Value Ref Range   Sodium 141 135 - 145 mmol/L   Potassium 3.0 (L) 3.5 - 5.1 mmol/L   Chloride 113 (H) 98 - 111 mmol/L   CO2 19 (L) 22 - 32 mmol/L   Glucose, Bld 103 (H) 70 - 99 mg/dL   BUN 38 (H) 8 - 23 mg/dL   Creatinine, Ser 0.91 0.61 - 1.24 mg/dL   Calcium 7.4 (L) 8.9 - 10.3 mg/dL   GFR calc non Af Amer >60 >60 mL/min   GFR calc Af Amer >60 >60 mL/min   Anion gap 9 5 - 15    Comment: Performed at [facility], [address] [place], [place] [address]  CBC     Status: Abnormal   Collection Time: 09/21/18  6:54 AM  Result Value Ref Range   WBC 3.6 (L) 4.0 - 10.5 K/uL   RBC 2.92 (L) 4.22 - 5.81 MIL/uL   Hemoglobin 8.7 (L) 13.0 - 17.0 g/dL   HCT 25.5 (L) 39.0 - 52.0 %   MCV 87.3 80.0 - 100.0 fL   MCH 29.8 26.0 - 34.0 pg   MCHC 34.1 30.0 - 36.0 g/dL   RDW 13.4 11.5 - 15.5 %   Platelets 52 (L) 150 - 400 K/uL    Comment: Immature Platelet Fraction may be clinically indicated, consider ordering this additional test [id number]    nRBC 0.0 0.0 - 0.2 %  Comment: Performed at  [facility], [address], [place], [place] [address]    Dg Chest 1 View  Result Date: 09/19/2018 CLINICAL DATA:  85 year old male with weakness and fever. COVID-19 status pending. EXAM: CHEST  1 VIEW COMPARISON:  CT Abdomen and Pelvis 01/20/2017. FINDINGS: Portable AP upright view at 1506 hours. Calcified aortic atherosclerosis. Other mediastinal contours are within normal limits. Visualized tracheal air column is within normal limits. Lung volumes are at the upper limits of normal. No pneumothorax, pleural effusion or pulmonary edema. No confluent pulmonary opacity. External button artifact suspected over the anterior 2nd rib on image #1. Chronic appearing posterior right rib fractures. Negative visible bowel gas pattern. IMPRESSION: No acute cardiopulmonary abnormality. Electronically Signed   By: Genevie Ann M.D.   On: 09/19/2018 15:39   Dg Pelvis 1-2 Views  Result Date: 09/20/2018 CLINICAL DATA:  Multiple falls.  Bilateral hip pain and weakness. EXAM: PELVIS - 1-2 VIEW COMPARISON:  CT 01/20/2017. FINDINGS: Diffuse osteopenia. Degenerative change both hips. No evidence of fracture or dislocation. A stable tiny sclerotic density noted in the left pubis consistent with a bone island. Pelvic calcifications consistent phleboliths. Peripheral vascular calcification. IMPRESSION: 1. Diffuse osteopenia. Degenerative change both hips. No acute bony abnormality. 2.  Peripheral vascular disease. Electronically Signed   By: Marcello Moores  Register   On: 09/20/2018 09:56   Ct Head Wo Contrast  Result Date: 09/19/2018 CLINICAL DATA:  Pt states he fell and hit his head on the bath tub today. Pt denies LOC EXAM: CT HEAD WITHOUT CONTRAST TECHNIQUE: Contiguous axial images were obtained from the base of the skull through the vertex without intravenous contrast. COMPARISON:  None. FINDINGS: Brain: No evidence of acute infarction, hemorrhage, extra-axial collection, ventriculomegaly, or mass effect. Generalized  cerebral atrophy. Periventricular white matter low attenuation likely secondary to microangiopathy. Vascular: Cerebrovascular atherosclerotic calcifications are noted. Skull: Negative for fracture or focal lesion. Sinuses/Orbits: Visualized portions of the orbits are unremarkable. Visualized portions of the paranasal sinuses and mastoid air cells are unremarkable. Other: Large left frontal scalp hematoma. IMPRESSION: No acute intracranial pathology. Electronically Signed   By: Kathreen Devoid   On: 09/19/2018 15:42   US Abdomen Complete  Result Date: 09/20/2018 CLINICAL DATA:  Elevated liver function tests. EXAM: ABDOMEN ULTRASOUND COMPLETE COMPARISON:  CT abdomen and pelvis 01/20/2017. FINDINGS: Gallbladder: No gallstones or wall thickening visualized. No sonographic Murphy sign noted by sonographer. Common bile duct: Diameter: 0.2 cm Liver: No focal lesion identified. Within normal limits in parenchymal echogenicity. Portal vein is patent on color Doppler imaging with normal direction of blood flow towards the liver. IVC: No abnormality visualized. Pancreas: Visualized portion unremarkable. Spleen: Size and appearance within normal limits. A few small calcifications in the spleen consistent with old granulomatous disease are seen as on prior CT. Right Kidney: Length: 12.5 cm. Echogenicity within normal limits. No solid mass or hydronephrosis visualized. 7.4 cm in diameter cyst is identified as seen on prior CT. Calcification in the wall of this cyst is unchanged. Left Kidney: Length: 12.5 cm. Echogenicity within normal limits. No solid mass or hydronephrosis visualized. Two simple cysts measuring 2.0 and 1.8 cm in diameter are unchanged. A 1.5 cm stone in the mid to lower pole is identified and was present on the prior CT. Abdominal aorta: No aneurysm visualized.  Atherosclerosis noted. Other findings: None. IMPRESSION: No acute abnormality.  Normal-appearing liver and gallbladder. Nonobstructing stone lower pole  left kidney. Bilateral renal cysts. Atherosclerosis. Electronically Signed   By: Marcello Moores  Dalessio M.D.   On: 09/20/2018 10:24    Review of Systems  Constitutional: Positive for malaise/fatigue.  HENT: Positive for congestion.   Eyes: Negative.   Respiratory: Positive for shortness of breath.   Cardiovascular: Positive for palpitations.  Gastrointestinal: Negative.   Genitourinary: Negative.   Musculoskeletal: Negative.   Skin: Negative.   Neurological: Positive for dizziness and weakness.  Endo/Heme/Allergies: Negative.   Psychiatric/Behavioral: Negative.    Blood pressure 135/85, pulse 92, temperature 98.6 F (37 C), temperature source Oral, resp. rate 19, height 6' (1.829 m), weight 60.5 kg, SpO2 99 %. Physical Exam  Nursing note and vitals reviewed. Constitutional: He is oriented to person, place, and time. He appears well-developed and well-nourished.  HENT:  Head: Atraumatic.  Large bruise left forehead  Eyes: Pupils are equal, round, and reactive to light. Conjunctivae and EOM are normal.  Neck: Normal range of motion. Neck supple.  Cardiovascular: Normal rate.  Respiratory: Effort normal and breath sounds normal.  GI: Bowel sounds are normal.  Musculoskeletal: Normal range of motion.     Comments: Large abrasion bruising right arm  Neurological: He is alert and oriented to person, place, and time. He has normal reflexes.  Skin: Skin is warm and dry.  Psychiatric: He has a normal mood and affect.    Assessment/Plan: Multiple falls Head trauma Arthritis Hypertension Vertigo Dehydration Syncope Acute renal insufficiency Borderline troponin . Plan Agree with admit to telemetry Follow-up EKGs and troponins Recommend hydration Echocardiogram may be helpful Recommend work-up for fever rule out sepsis Local treatment for forehead and arms lesions   D  09/21/2018, 12:12 PM

## 2018-09-21 NOTE — Progress Notes (Addendum)
Patient ID: [name], male   DOB: [birth year], 85 y.o.   MRN: [id number]  Discussing with infectious disease Dr. Tama High I have placed consult for Dr. Tasia Catchings from oncology to see patient for pancytopenia. Peripheral smear pending

## 2018-09-21 NOTE — Progress Notes (Signed)
Referring Physician:  No referring provider defined for this encounter.  Primary Physician:  [name], MD  Chief Complaint:  Subdural hematoma  History of Present Illness: [name] is a 85 y.o. male who presents as a consult for subdural hematoma.  States that he fell 2 days ago approximately 4 times in the same day.  Stated that he was standing up too quickly and felt dizzy which resulted in falls.  Denies headache, nausea, vision changes, seizures, weakness.  He does complain of issues with his speech (hard to find words) and a little confusion.   Review of Systems:  A 10 point review of systems is negative, except for the pertinent positives and negatives detailed in the HPI.  Past Medical History: Past Medical History:  Diagnosis Date  . Arthritis   . Cancer [facility])    Prostate Cancer  . Hypertension     Past Surgical History: Past Surgical History:  Procedure Laterality Date  . ESOPHAGOGASTRODUODENOSCOPY (EGD) WITH PROPOFOL N/A 12/06/2017   Procedure: ESOPHAGOGASTRODUODENOSCOPY (EGD) WITH PROPOFOL;  Surgeon: Jonathon Bellows, MD;  Location: [facility] ENDOSCOPY;  Service: Gastroenterology;  Laterality: N/A;  . PROSTATE CRYOABLATION      Allergies: Allergies as of 09/19/2018  . (No Known Allergies)    Medications:  Current Facility-Administered Medications:  .  acetaminophen (TYLENOL) tablet 650 mg, 650 mg, Oral, Q6H PRN, 650 mg at 09/20/18 1944 **OR** acetaminophen (TYLENOL) suppository 650 mg, 650 mg, Rectal, Q6H PRN, Mody, Sital, MD .  ceFEPIme (MAXIPIME) 2 g in sodium chloride 0.9 % 100 mL IVPB, 2 g, Intravenous, Q12H, Fritzi Mandes, MD .  doxycycline (VIBRAMYCIN) 100 mg in sodium chloride 0.9 % 250 mL IVPB, 100 mg, Intravenous, Q12H, Ravishankar, Jayashree, MD, Last Rate: 125 mL/hr at 09/21/18 1440, 100 mg at 09/21/18 1440 .  feeding supplement (ENSURE ENLIVE) (ENSURE ENLIVE) liquid 237 mL, 237 mL, Oral, TID BM, Fritzi Mandes, MD, 237 mL at 09/21/18 0911 .   HYDROcodone-acetaminophen (NORCO/VICODIN) 5-325 MG per tablet 1-2 tablet, 1-2 tablet, Oral, Q4H PRN, Mody, Sital, MD .  lisinopril (ZESTRIL) tablet 20 mg, 20 mg, Oral, Daily, Fritzi Mandes, MD, 20 mg at 09/21/18 1441 .  multivitamin with minerals tablet 1 tablet, 1 tablet, Oral, Daily, Fritzi Mandes, MD, 1 tablet at 09/21/18 0905 .  ondansetron (ZOFRAN) tablet 4 mg, 4 mg, Oral, Q6H PRN **OR** ondansetron (ZOFRAN) injection 4 mg, 4 mg, Intravenous, Q6H PRN, Mody, Sital, MD .  pantoprazole (PROTONIX) EC tablet 40 mg, 40 mg, Oral, BID, Fritzi Mandes, MD .  phosphorus (K PHOS NEUTRAL) tablet 500 mg, 500 mg, Oral, Q4H, Oswald Hillock, RPH, 500 mg at 09/21/18 1441 .  polyethylene glycol (MIRALAX / GLYCOLAX) packet 17 g, 17 g, Oral, Daily PRN, Bettey Costa, MD .  Derrill Memo ON 09/22/2018] vancomycin (VANCOCIN) 1,250 mg in sodium chloride 0.9 % 250 mL IVPB, 1,250 mg, Intravenous, Q24H, Ravishankar, Jayashree, MD .  vancomycin (VANCOCIN) 1,500 mg in sodium chloride 0.9 % 500 mL IVPB, 1,500 mg, Intravenous, Once, Ravishankar, Joellyn Quails, MD, Last Rate: 250 mL/hr at 09/21/18 1656, 1,500 mg at 09/21/18 1656 .  vitamin C (ASCORBIC ACID) tablet 250 mg, 250 mg, Oral, BID, Fritzi Mandes, MD, 250 mg at 09/21/18 6222   Social History: Social History   Tobacco Use  . Smoking status: Former Smoker    Types: Pipe    Quit date: 03/30/2002    Years since quitting: 16.4  . Smokeless tobacco: Never Used  Substance Use Topics  . Alcohol use: Yes  .  Drug use: Never    Family Medical History: History reviewed. No pertinent family history.  Physical Examination: Vitals:   09/21/18 0731 09/21/18 1701  BP: 135/85 114/88  Pulse: 92 97  Resp: 19 19  Temp: 98.6 F (37 C) 99.4 F (37.4 C)  SpO2: 99% 96%     General: Patient is well developed, well nourished, calm, collected, and in no apparent distress.  Psychiatric: Patient is non-anxious.  Head:  Pupils equal, round, and reactive to light.  ENT:  Oral mucosa appears  well hydrated.  Neck:   Supple.  Full range of motion.  Respiratory: Patient is breathing without any difficulty.  Skin:   Large area of ecchymosis on left to midline forehead and scalp.  NEUROLOGICAL:  General: In no acute distress.   Awake, alert, oriented to person, place, and time.  Pupils equal round and reactive to light.  Facial tone is symmetric.  Tongue protrusion is midline.  There is no pronator drift   Strength: Side Biceps Triceps Deltoid Interossei Grip Wrist Ext. Wrist Flex.  R 5 5 5 5 5 5 5   L 5 5 5 5 5 5 5    Side Iliopsoas Quads Hamstring PF DF EHL  R 5 5 5 5 5 5   L 5 5 5 5 5 5    Reflexes are 2+ and symmetric at the biceps, triceps, brachioradialis, patella and achilles.   Bilateral upper and lower extremity sensation is intact to light touch and pin prick.  Clonus is not present.  Hoffman's is absent.  Imaging: EXAM: MRI HEAD WITHOUT CONTRAST 09/21/2018  TECHNIQUE: Multiplanar, multiecho pulse sequences of the brain and surrounding structures were obtained without intravenous contrast.  COMPARISON:  Prior CT from 09/19/2018.  FINDINGS: Brain: Generalized age-related cerebral atrophy with mild chronic small vessel ischemic disease. Remote lacunar infarct present at the right external capsule/lentiform nucleus. Small amount of associated chronic hemosiderin staining.  There has been interval development of an acute subdural hematoma overlying the left cerebral convexity. This measures up to 8 mm in maximal diameter. Extension along the falx. Associated trace 2 mm left-to-right shift. No hydrocephalus or ventricular trapping. Basilar cisterns remain patent.  4 mm focus of diffusion abnormality involving the left frontal cortex at the level of the operculum suspicious for a small acute ischemic infarct (series 7, image 22). No associated hemorrhage. No other evidence for acute or subacute ischemia. Gray-white matter differentiation otherwise  maintained.  Pituitary gland prominent with abnormal convex border superiorly (series 9, image 12). No other mass lesion. Midline structures intact.  Vascular: Major intracranial vascular flow voids are maintained.  Skull and upper cervical spine: Craniocervical junction within normal limits. Multilevel degenerative spondylolysis noted within the upper cervical spine without high-grade stenosis. Bone marrow signal intensity within normal limits. Evolving soft tissue contusion/hematoma at the left frontal scalp.  Sinuses/Orbits: Globes and orbital soft tissues within normal limits. Paranasal sinuses are clear. Small right mastoid effusion noted, of doubtful significance. Inner ear structures grossly normal.  Other: None.  IMPRESSION: 1. Delayed intracranial hemorrhage, with interval development of acute left subdural hematoma, measuring up to 8 mm in maximal thickness. Associated mild mass effect with trace 2 mm left-to-right shift. 2. 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 3. Underlying age-related cerebral atrophy with mild chronic microvascular ischemic disease. 4. Abnormal in prominent appearance of the pituitary gland, raising the possibility for an underlying pituitary lesion. Correlation with laboratory values recommended. Additionally, further assessment with dedicated pituitary  protocol MRI suggested for further evaluation. This could be performed on a nonemergent outpatient basis.  Critical Value/emergent results were called by telephone at the time of interpretation on 09/21/2018 at 2:30 pm to Dr. Fritzi Mandes , who verbally acknowledged these results.  EXAM: CT HEAD WITHOUT CONTRAST 09/19/2018  TECHNIQUE: Contiguous axial images were obtained from the base of the skull through the vertex without intravenous contrast.  COMPARISON:  None.  FINDINGS: Brain: No evidence of acute infarction,  hemorrhage, extra-axial collection, ventriculomegaly, or mass effect. Generalized cerebral atrophy. Periventricular white matter low attenuation likely secondary to microangiopathy.  Vascular: Cerebrovascular atherosclerotic calcifications are noted.  Skull: Negative for fracture or focal lesion.  Sinuses/Orbits: Visualized portions of the orbits are unremarkable. Visualized portions of the paranasal sinuses and mastoid air cells are unremarkable.  Other: Large left frontal scalp hematoma.  IMPRESSION: No acute intracranial pathology.  Assessment and Plan: Mr. Nguyen is a pleasant 85 y.o. male with increase in left frontal subdural hematoma edging today compared with head CT 2 days ago.  Neurologically intact.  Recommend repeat head CT tomorrow for evaluation of stability of hematoma.  Marin Olp, PA-C Dept. of Neurosurgery

## 2018-09-21 NOTE — Progress Notes (Signed)
   09/21/18 1600  Clinical Encounter Type  Visited With Family  Advance Directives (For Healthcare)  Does Patient Have a Medical Advance Directive? No  Would patient like information on creating a medical advance directive? Yes (Inpatient - patient defers creating a medical advance directive at this time - Information given)  Pt daughter, [name] contacted to inform her that an AD could not be finalized based on the pt's current status at this time.

## 2018-09-22 DIAGNOSIS — K921 Melena: Secondary | ICD-10-CM

## 2018-09-22 DIAGNOSIS — R945 Abnormal results of liver function studies: Secondary | ICD-10-CM

## 2018-09-22 LAB — HEPATITIS PANEL, ACUTE
HCV Ab: <0.1 s/co ratio (ref 0.0–0.9)
Hep A IgM: NEGATIVE
Hep B C IgM: NEGATIVE
Hepatitis B Surface Ag: NEGATIVE

## 2018-09-22 LAB — HEPATIC FUNCTION PANEL
ALT: 127 U/L — ABNORMAL HIGH (ref 0–44)
AST: 297 U/L — ABNORMAL HIGH (ref 15–41)
Albumin: 2.1 g/dL — ABNORMAL LOW (ref 3.5–5.0)
Alkaline Phosphatase: 113 U/L (ref 38–126)
Bilirubin, Direct: 0.3 mg/dL — ABNORMAL HIGH (ref 0.0–0.2)
Indirect Bilirubin: 0.5 mg/dL (ref 0.3–0.9)
Total Bilirubin: 0.8 mg/dL (ref 0.3–1.2)
Total Protein: 4.1 g/dL — ABNORMAL LOW (ref 6.5–8.1)

## 2018-09-22 LAB — BASIC METABOLIC PANEL
Anion gap: 7 (ref 5–15)
BUN: 29 mg/dL — ABNORMAL HIGH (ref 8–23)
CO2: 22 mmol/L (ref 22–32)
Calcium: 7.4 mg/dL — ABNORMAL LOW (ref 8.9–10.3)
Chloride: 109 mmol/L (ref 98–111)
Creatinine, Ser: 0.87 mg/dL (ref 0.61–1.24)
GFR calc Af Amer: >60 mL/min (ref >60)
GFR calc non Af Amer: >60 mL/min (ref >60)
Glucose, Bld: 113 mg/dL — ABNORMAL HIGH (ref 70–99)
Potassium: 3.1 mmol/L — ABNORMAL LOW (ref 3.5–5.1)
Sodium: 138 mmol/L (ref 135–145)

## 2018-09-22 LAB — CULTURE, BLOOD (ROUTINE X 2): Special Requests: ADEQUATE

## 2018-09-22 LAB — CBC
HCT: 22.7 % — ABNORMAL LOW (ref 39.0–52.0)
Hemoglobin: 7.6 g/dL — ABNORMAL LOW (ref 13.0–17.0)
MCH: 29.8 pg (ref 26.0–34.0)
MCHC: 33.5 g/dL (ref 30.0–36.0)
MCV: 89 fL (ref 80.0–100.0)
Platelets: 68 10*3/uL — ABNORMAL LOW (ref 150–400)
RBC: 2.55 MIL/uL — ABNORMAL LOW (ref 4.22–5.81)
RDW: 13.9 % (ref 11.5–15.5)
WBC: 4.8 10*3/uL (ref 4.0–10.5)
nRBC: 0 % (ref 0.0–0.2)

## 2018-09-22 LAB — PHOSPHORUS: Phosphorus: 2.2 mg/dL — ABNORMAL LOW (ref 2.5–4.6)

## 2018-09-22 LAB — RETICULOCYTES
Immature Retic Fract: 3.8 % (ref 2.3–15.9)
RBC.: 2.56 MIL/uL — ABNORMAL LOW (ref 4.22–5.81)
Retic Count, Absolute: 7.2 10*3/uL — ABNORMAL LOW (ref 19.0–186.0)
Retic Ct Pct: 0.3 % — ABNORMAL LOW (ref 0.4–3.1)

## 2018-09-22 LAB — HAPTOGLOBIN: Haptoglobin: 71 mg/dL (ref 38–329)

## 2018-09-22 LAB — HIV ANTIBODY (ROUTINE TESTING W REFLEX): HIV Screen 4th Generation wRfx: NONREACTIVE

## 2018-09-22 MED ORDER — POTASSIUM CHLORIDE CRYS ER 20 MEQ PO TBCR: 40.0000 meq | EXTENDED_RELEASE_TABLET | Freq: Once | ORAL | Status: DC

## 2018-09-22 MED ORDER — HYDROCODONE-ACETAMINOPHEN 5-325 MG PO TABS
1.0000 | ORAL_TABLET | Freq: Four times a day (QID) | ORAL | Status: DC | PRN
  Administered 2018-10-09 – 2018-10-10 (×3): 1 via ORAL
  Filled 2018-09-22 (×3): qty 1

## 2018-09-22 MED ORDER — POTASSIUM CHLORIDE CRYS ER 20 MEQ PO TBCR
40.0000 meq | EXTENDED_RELEASE_TABLET | Freq: Every day | ORAL | Status: DC
  Administered 2018-09-22: 40 meq via ORAL
  Filled 2018-09-22: qty 2

## 2018-09-22 MED ORDER — SODIUM CHLORIDE 0.9 % IV SOLN
INTRAVENOUS | Status: DC
  Administered 2018-09-22 – 2018-09-23 (×2): via INTRAVENOUS

## 2018-09-22 MED ORDER — K PHOS MONO-SOD PHOS DI & MONO 155-852-130 MG PO TABS
500.0000 mg | ORAL_TABLET | ORAL | Status: AC
  Administered 2018-09-22: 500 mg via ORAL
  Filled 2018-09-22 (×2): qty 2

## 2018-09-22 NOTE — Plan of Care (Signed)
  Problem: Nutrition: Goal: Adequate nutrition will be maintained Outcome: Not Progressing Note: Choking noted when swallowing water while passing medications.  MD informed, and SLP consult placed.    Problem: Education: Goal: Knowledge of General Education information will improve Description: Including pain rating scale, medication(s)/side effects and non-pharmacologic comfort measures Outcome: Progressing   Problem: Health Behavior/Discharge Planning: Goal: Ability to manage health-related needs will improve Outcome: Progressing   Problem: Clinical Measurements: Goal: Ability to maintain clinical measurements within normal limits will improve Outcome: Progressing Goal: Will remain free from infection Outcome: Progressing Goal: Diagnostic test results will improve Outcome: Progressing Goal: Respiratory complications will improve Outcome: Progressing Goal: Cardiovascular complication will be avoided Outcome: Progressing   Problem: Activity: Goal: Risk for activity intolerance will decrease Outcome: Progressing   Problem: Coping: Goal: Level of anxiety will decrease Outcome: Progressing   Problem: Elimination: Goal: Will not experience complications related to bowel motility Outcome: Progressing Goal: Will not experience complications related to urinary retention Outcome: Progressing   Problem: Pain Managment: Goal: General experience of comfort will improve Outcome: Progressing   Problem: Safety: Goal: Ability to remain free from injury will improve Outcome: Progressing   Problem: Skin Integrity: Goal: Risk for impaired skin integrity will decrease Outcome: Progressing

## 2018-09-22 NOTE — Evaluation (Signed)
Clinical/Bedside Swallow Evaluation Patient Details  Name: [name] MRN: [id number] Date of Birth: February 02, 1934  Today's Date: 09/22/2018 Time: SLP Start Time (ACUTE ONLY): 1200 SLP Stop Time (ACUTE ONLY): 1300 SLP Time Calculation (min) (ACUTE ONLY): 60 min  Past Medical History:  Past Medical History:  Diagnosis Date  . Arthritis   . Cancer [facility])    Prostate Cancer  . Hypertension    Past Surgical History:  Past Surgical History:  Procedure Laterality Date  . ESOPHAGOGASTRODUODENOSCOPY (EGD) WITH PROPOFOL N/A 12/06/2017   Procedure: ESOPHAGOGASTRODUODENOSCOPY (EGD) WITH PROPOFOL;  Surgeon: Jonathon Bellows, MD;  Location: [facility] ENDOSCOPY;  Service: Gastroenterology;  Laterality: N/A;  . PROSTATE CRYOABLATION     HPI:  Pt is an 85 y/o male who presented to the ED s/p a fall w/ TBI. Pt has a h/o medical issues including f/u by GI w/ EGD dx'ing Esophageal dysmotility w/ "Salmon-colored mucosa suspicious for short-segment Barrett's esophagus"; pt endorses Esophageal deficits pointing to his mid-sternum area as baseline for ~2 years. Currently, pt reports for the past 3 days he has had dizziness and feelings of lightheadedness.  He denies chest pain, shortness of breath, cough, fever or urinary symptoms.  He was noted to have large amount of bruising on the left side of his forehead and skin tears.  His blood pressure was also noted to be low when EMS arrived. GI was consulted dx'ing anemia with a hemoglobin of 9.9 with recent multiple falls and thrombocytopenia.   Assessment / Plan / Recommendation Clinical Impression  Pt appears to present w/ pharyngeal phase dyyphagia w/ increased risk for aspiration thus Pulmonary decline from such. Pt does have a GI h/o dysmotility w/ possible Barrett's Esophagus per GI notes; pt endorses feelings of discomfort (mid-sternum) w/ some solid foods(meats). Currently, pt has had a fall w/ "Delayed intracranial hemorrhage, with interval development of acute  left subdural hematoma, measuring up to 8 mm in maximal thickness. Associated mild mass effect with trace 2 mm left-to-right Shift. Also, a new CVA w/ punctate 4 mm acute ischemic cortical infarct involving the underlying left frontal operculum.". Pt exhibits Dysfluency of speech; unsure if any true language deficits resulting from this event - further assessment is recommended to r/o.  Pt has been having noted episodes of Dysphagia (choking while attempting to swallow pills w/ NSG). At this evaluation today, pt exhibited overt s/s of aspiration w/ all consistencies assessed. Immediate and delayed coughing and/or throat clearing post trials w/ multiple swallows to attempt pharyngeal clearing. No wet vocal quality noted post the coughing episodes; respiratory status briefly impacted by the coughing. Oral phase was [facility] for bolus management, A-P transfer, and oral clearing. OM exam appeared [facility] w/ strong Gag reflex+. Pt was able to feed self independently.  Due to presentation of Dysphagia and increased risk for aspiration and Pulmonary decline, recommend a NPO status w/ f/u of an objective assessment, MBSS, in order to better assess oropharyngeal swallow function and recommend least restrictive diet consistency. Instructed pt and NSG on use of oral swabs for hygiene and stimulation of pharyngeal swallowing; aspiration precautions. Recommend medications via alternative means currently. NSG and MD updated. ST services will f/u tomorrow. Daughter updated.  SLP Visit Diagnosis: Dysphagia, oropharyngeal phase (R13.12)(baseline Esophageal phase deficits)    Aspiration Risk  Moderate+ aspiration risk;Risk for inadequate nutrition/hydration    Diet Recommendation  NPO w/ frequent oral care for hygiene and stimulation of swallowing; aspiration precautions; reflux precautions   Medication Administration: Via alternative means  Other  Recommendations Recommended Consults: (Dietician f/u; continue ongoing GI f/u  outpt ) Oral Care Recommendations: Oral care QID;Patient independent with oral care(setup assist) Other Recommendations: (TBD)   Follow up Recommendations Home health SLP(TBD)      Frequency and Duration min 3x week  2 weeks       Prognosis Prognosis for Safe Diet Advancement: Fair Barriers to Reach Goals: Severity of deficits(baseline GI deficits; Esophageal dysmotility)      Swallow Study   General Date of Onset: 09/19/18 HPI: (Pt is an 85 y/o male who presented to the ED) Type of Study: Bedside Swallow Evaluation Previous Swallow Assessment: (none; noted GI asssessments) Diet Prior to this Study: Regular;Thin liquids Temperature Spikes Noted: No(4.8 wbc) Respiratory Status: Room air History of Recent Intubation: No Behavior/Cognition: Alert;Cooperative;Pleasant mood Oral Cavity Assessment: Within Functional Limits Oral Care Completed by SLP: Yes Oral Cavity - Dentition: Dentures, top;Dentures, bottom(partial on bottom) Vision: Functional for self-feeding Self-Feeding Abilities: Able to feed self;Needs assist;Needs set up Patient Positioning: Upright in bed Baseline Vocal Quality: Normal(Dysfluency of speech; potential language deficits(?)) Volitional Cough: Strong Volitional Swallow: Able to elicit    Oral/Motor/Sensory Function Overall Oral Motor/Sensory Function: Within functional limits(no unilateral weakness; Gag reflux +)   Ice Chips Ice chips: Within functional limits Presentation: Spoon(fed; 3 trials)   Thin Liquid Thin Liquid: Impaired Presentation: Cup;Self Fed(2 trials) Oral Phase Impairments: (none) Oral Phase Functional Implications: (none) Pharyngeal  Phase Impairments: Suspected delayed Swallow;Multiple swallows;Cough - Immediate(2/2 trials)    Nectar Thick Nectar Thick Liquid: Impaired Presentation: Cup;Self Fed;Spoon(2 and 2 trials) Oral Phase Impairments: (none) Oral phase functional implications: (none) Pharyngeal Phase Impairments: Suspected  delayed Swallow;Multiple swallows;Throat Clearing - Immediate;Cough - Delayed   Honey Thick Honey Thick Liquid: Not tested   Puree Puree: Impaired Presentation: Self Fed;Spoon(3 trials) Oral Phase Impairments: (none) Oral Phase Functional Implications: (none) Pharyngeal Phase Impairments: Multiple swallows;Throat Clearing - Delayed(x2)   Solid     Solid: Not tested       Orinda Kenner, MS, CCC-SLP Watson,Katherine 09/22/2018,4:41 PM

## 2018-09-22 NOTE — Consult Note (Signed)
PHARMACY CONSULT NOTE - FOLLOW UP  Pharmacy Consult for Electrolyte Monitoring and Replacement   Recent Labs: Potassium (mmol/L)  Date Value  09/22/2018 3.1 (L)   Magnesium (mg/dL)  Date Value  09/20/2018 1.9   Calcium (mg/dL)  Date Value  09/22/2018 7.4 (L)   Albumin (g/dL)  Date Value  09/21/2018 2.4 (L)   Phosphorus (mg/dL)  Date Value  09/22/2018 2.2 (L)   Sodium (mmol/L)  Date Value  09/22/2018 138   Corrected Ca: 8.7 Scr 1.81>0.91  Assessment: Patient admitted with  Hypokalemia. Pharmacy consulted for electrolyte monitoring and replacement. Kphos neutral 3 out of 4 doses were given.   Goal of Therapy:  Electrolytes WNL  Plan:  K+ 3.1. Will order KCL 69mEq daily + an extra KCl 40 mEq x 1.  Phos 2.2. Will order Kphos neutral tablets 2 tab every 4 hours x 2.   Will F/U with AM labs and continue to replace electrolytes as needed.   Oswald Hillock, PharmD, BCPS Clinical Pharmacist 09/22/2018 9:02 AM

## 2018-09-22 NOTE — Care Management Important Message (Signed)
Important Message  Patient Details  Name: [name] MRN: [id number] Date of Birth: 04-03-1933   Medicare Important Message Given:  Yes     Dannette Barbara 09/22/2018, 12:05 PM

## 2018-09-22 NOTE — Progress Notes (Addendum)
   Date of Admission:  09/19/2018      Subjective: Says he has been in bed today Weak No fever  Medications:  . feeding supplement (ENSURE ENLIVE)  237 mL Oral TID BM  . lisinopril  20 mg Oral Daily  . multivitamin with minerals  1 tablet Oral Daily  . pantoprazole  40 mg Oral BID  . phosphorus  500 mg Oral Q4H  . potassium chloride  40 mEq Oral Daily  . potassium chloride  40 mEq Oral Once  . vitamin C  250 mg Oral BID    Objective: Vital signs in last 24 hours: Temp:  [97.3 F (36.3 C)-99.4 F (37.4 C)] 97.3 F (36.3 C) (06/25 0730) Pulse Rate:  [83-115] 83 (06/25 0730) Resp:  [16-20] 17 (06/25 0730) BP: (113-129)/(76-88) 113/76 (06/25 0730) SpO2:  [96 %-98 %] 98 % (06/25 0730) Weight:  [60.5 kg] 60.5 kg (06/25 0349)  PHYSICAL EXAM:  General: Alert, cooperative,emaciated Left face ecchymosis  Lungs:b/l air entry Heart: s1s2Abdomen: Soft, non-tender,not distended. Bowel sounds normal. No masses Neurologic: Grossly non-focal  Lab Results Recent Labs    09/21/18 0654 09/21/18 1541 09/22/18 0826  WBC 3.6*  --  4.8  HGB 8.7*  --  7.6*  HCT 25.5*  --  22.7*  NA 141  --  138  K 3.0*  --  3.1*  CL 113*  --  109  CO2 19*  --  22  BUN 38*  --  29*  CREATININE 0.91 0.90 0.87   Liver Panel Recent Labs    09/19/18 1535 09/21/18 1300  PROT 5.5* 4.4*  ALBUMIN 3.0* 2.4*  AST 158* 294*  ALT 74* 114*  ALKPHOS 76 112  BILITOT 0.9 0.7  BILIDIR  --  0.4*  IBILI  --  0.3   Sedimentation Rate No results for input(s): ESRSEDRATE in the last 72 hours. C-Reactive Protein No results for input(s): CRP in the last 72 hours.  Microbiology:  Studies/Results: Delayed intracranial hemorrhage, with interval development of acute left subdural hematoma, measuring up to 8 mm in maximal thickness. Associated mild mass effect with trace 2 mm left-to-right shift.   Assessment/Plan: Pt admitted with syncope, fall, trauma   Syncope could be from postural hypotension  .acute anemia, need to r/o cardiac cause   Fever with pancytopenia- wit h/o tick exposure ehrlichia/RMSF is a concern and patiet started on Doxy- Anemia is usually not a feature of the above But he has not had any fever since yesterday- hence continue doxycycline. DC   Gram positive rod in 1 of 4 blood culture- bacillus a  contaminant- so will Dc vanco and cefepime  Abnormal LFTS, high LDH, high ferritin, low normal haptoglobulin- With pancytopenia- r/o HLH, MDS, Hemolysis  H/o prostate ca- had cryoablation-  R/o mets  Discussed the management with patient

## 2018-09-22 NOTE — Progress Notes (Signed)
Hematology/Oncology Progress Note [facility] [phone] [phone]  Patient Care Team: [name], MD as PCP - General (Family Medicine)   Name of the patient: [name]  [id number]  08-Oct-1933  Date of visit: 09/22/18   INTERVAL HISTORY-  Patient lying in the bed.  Reports feeling weak. Afebrile.    Review of systems- Review of Systems  Constitutional: Positive for fatigue.  Respiratory: Negative for chest tightness and cough.   Cardiovascular: Negative for chest pain.  Gastrointestinal: Negative for nausea.  Genitourinary: Negative for difficulty urinating.   Skin: Negative for rash.  Neurological: Negative for headaches.  Psychiatric/Behavioral: Negative for confusion.    No Known Allergies  Patient Active Problem List   Diagnosis Date Noted   Anemia    Thrombocytopenia ([place])    Blood in stool    Elevated transaminase level    Syncope 09/19/2018     Past Medical History:  Diagnosis Date   Arthritis    Cancer [facility])    Prostate Cancer   Hypertension      Past Surgical History:  Procedure Laterality Date   ESOPHAGOGASTRODUODENOSCOPY (EGD) WITH PROPOFOL N/A 12/06/2017   Procedure: ESOPHAGOGASTRODUODENOSCOPY (EGD) WITH PROPOFOL;  Surgeon: Jonathon Bellows, MD;  Location: [facility] ENDOSCOPY;  Service: Gastroenterology;  Laterality: N/A;   PROSTATE CRYOABLATION      Social History   Socioeconomic History   Marital status: Widowed    Spouse name: Not on file   Number of children: Not on file   Years of education: Not on file   Highest education level: Not on file  Occupational History   Not on file  Social Needs   Financial resource strain: Not on file   Food insecurity    Worry: Not on file    Inability: Not on file   Transportation needs    Medical: Not on file    Non-medical: Not on file  Tobacco Use   Smoking status: Former Smoker    Types: Pipe    Quit date: 03/30/2002   Years since quitting: 16.4   Smokeless tobacco: Never Used  Substance and Sexual Activity   Alcohol use: Yes   Drug use: Never   Sexual activity: Not on file  Lifestyle   Physical activity    Days per week: Not on file    Minutes per session: Not on file   Stress: Not on file  Relationships   Social connections    Talks on phone: Not on file    Gets together: Not on file    Attends religious service: Not on file    Active member of club or organization: Not on file    Attends meetings of clubs or organizations: Not on file    Relationship status: Not on file   Intimate partner violence    Fear of current or ex partner: Not on file    Emotionally abused: Not on file    Physically abused: Not on file    Forced sexual activity: Not on file  Other Topics Concern   Not on file  Social History Narrative   Not on file     History reviewed. No pertinent family history.   Current Facility-Administered Medications:    0.9 %  sodium chloride infusion, , Intravenous, Continuous, Fritzi Mandes, MD, Last Rate: 75 mL/hr at 09/22/18 1816   acetaminophen (TYLENOL) tablet 650 mg, 650 mg, Oral, Q6H PRN, 650 mg at 09/20/18 1944 **OR** acetaminophen (TYLENOL) suppository 650 mg, 650 mg,  Rectal, Q6H PRN, Benjie Karvonen, Sital, MD   doxycycline (VIBRAMYCIN) 100 mg in sodium chloride 0.9 % 250 mL IVPB, 100 mg, Intravenous, Q12H, Ravishankar, Joellyn Quails, MD, Last Rate: 125 mL/hr at 09/22/18 1308, 100 mg at 09/22/18 1308   feeding supplement (ENSURE ENLIVE) (ENSURE ENLIVE) liquid 237 mL, 237 mL, Oral, TID BM, Fritzi Mandes, MD, Stopped at 09/22/18 2010   HYDROcodone-acetaminophen (NORCO/VICODIN) 5-325 MG per tablet 1 tablet, 1 tablet, Oral, Q6H PRN, Fritzi Mandes, MD   lisinopril (ZESTRIL) tablet 20 mg, 20 mg, Oral, Daily, Fritzi Mandes, MD, 20 mg at 09/22/18 1033   multivitamin with minerals tablet 1 tablet, 1 tablet, Oral, Daily, Fritzi Mandes, MD, 1 tablet at 09/22/18 1032   ondansetron (ZOFRAN)  tablet 4 mg, 4 mg, Oral, Q6H PRN **OR** ondansetron (ZOFRAN) injection 4 mg, 4 mg, Intravenous, Q6H PRN, Mody, Sital, MD   pantoprazole (PROTONIX) EC tablet 40 mg, 40 mg, Oral, BID, Fritzi Mandes, MD, Stopped at 09/22/18 2011   polyethylene glycol (MIRALAX / GLYCOLAX) packet 17 g, 17 g, Oral, Daily PRN, Mody, Sital, MD   potassium chloride SA (K-DUR) CR tablet 40 mEq, 40 mEq, Oral, Daily, Oswald Hillock, RPH, 40 mEq at 09/22/18 1033   potassium chloride SA (K-DUR) CR tablet 40 mEq, 40 mEq, Oral, Once, Oswald Hillock, RPH   vitamin C (ASCORBIC ACID) tablet 250 mg, 250 mg, Oral, BID, Fritzi Mandes, MD, Stopped at 09/22/18 2011   Physical exam:  Vitals:   09/22/18 0349 09/22/18 0730 09/22/18 1627 09/22/18 2020  BP: 125/83 113/76 128/77 (!) 151/84  Pulse: 90 83 82 88  Resp: _0 Temp: 99.3 F (37.4 C) (!) 97.3 F (36.3 C) 98 F (36.7 C) 98.7 F (37.1 C)  TempSrc: Oral Oral Oral Oral  SpO2: 98% 98% 100% 98%  Weight: 133 lb 6.4 oz (60.5 kg)     Height:       Physical Exam  Constitutional: He is oriented to person, place, and time. No distress.  Frail appearance, lying the bed  HENT:  Head: Normocephalic and atraumatic.  Mouth/Throat: No oropharyngeal exudate.  Left forehead bruising  Eyes: Pupils are equal, round, and reactive to light. EOM are normal. No scleral icterus.  Neck: Normal range of motion. Neck supple.  Cardiovascular: Normal rate and regular rhythm.  No murmur heard. Pulmonary/Chest: Effort normal. No respiratory distress.  Abdominal: Soft.  Musculoskeletal: Normal range of motion.        General: No edema.  Neurological: He is alert and oriented to person, place, and time. No cranial nerve deficit. He exhibits normal muscle tone. Coordination normal.  Skin: Skin is warm and dry. He is not diaphoretic.  Bruising  Psychiatric: Affect normal.       CMP Latest Ref Rng & Units 09/22/2018  Glucose 70 - 99 mg/dL 113(H)  BUN 8 - 23 mg/dL 29(H)  Creatinine  0.61 - 1.24 mg/dL 0.87  Sodium 135 - 145 mmol/L 138  Potassium 3.5 - 5.1 mmol/L 3.1(L)  Chloride 98 - 111 mmol/L 109  CO2 22 - 32 mmol/L 22  Calcium 8.9 - 10.3 mg/dL 7.4(L)  Total Protein 6.5 - 8.1 g/dL 4.1(L)  Total Bilirubin 0.3 - 1.2 mg/dL 0.8  Alkaline Phos 38 - 126 U/L 113  AST 15 - 41 U/L 297(H)  ALT 0 - 44 U/L 127(H)   CBC Latest Ref Rng & Units 09/22/2018  WBC 4.0 - 10.5 K/uL 4.8  Hemoglobin 13.0 - 17.0 g/dL 7.6(L)  Hematocrit 39.0 -  52.0 % 22.7(L)  Platelets 150 - 400 K/uL 68(L)    RADIOGRAPHIC STUDIES: I have personally reviewed the radiological images as listed and agreed with the findings in the report.  Dg Chest 1 View  Result Date: 09/19/2018 CLINICAL DATA:  85 year old male with weakness and fever. COVID-19 status pending. EXAM: CHEST  1 VIEW COMPARISON:  CT Abdomen and Pelvis 01/20/2017. FINDINGS: Portable AP upright view at 1506 hours. Calcified aortic atherosclerosis. Other mediastinal contours are within normal limits. Visualized tracheal air column is within normal limits. Lung volumes are at the upper limits of normal. No pneumothorax, pleural effusion or pulmonary edema. No confluent pulmonary opacity. External button artifact suspected over the anterior 2nd rib on image #1. Chronic appearing posterior right rib fractures. Negative visible bowel gas pattern. IMPRESSION: No acute cardiopulmonary abnormality. Electronically Signed   By: Genevie Ann M.D.   On: 09/19/2018 15:39   Dg Pelvis 1-2 Views  Result Date: 09/20/2018 CLINICAL DATA:  Multiple falls.  Bilateral hip pain and weakness. EXAM: PELVIS - 1-2 VIEW COMPARISON:  CT 01/20/2017. FINDINGS: Diffuse osteopenia. Degenerative change both hips. No evidence of fracture or dislocation. A stable tiny sclerotic density noted in the left pubis consistent with a bone island. Pelvic calcifications consistent phleboliths. Peripheral vascular calcification. IMPRESSION: 1. Diffuse osteopenia. Degenerative change both hips. No  acute bony abnormality. 2.  Peripheral vascular disease. Electronically Signed   By: Marcello Moores  Register   On: 09/20/2018 09:56   Ct Head Wo Contrast  Result Date: 09/21/2018 CLINICAL DATA:  Follow-up examination for subdural hemorrhage. EXAM: CT HEAD WITHOUT CONTRAST TECHNIQUE: Contiguous axial images were obtained from the base of the skull through the vertex without intravenous contrast. COMPARISON:  Previous brain MRI from earlier the same day. FINDINGS: Brain: Again seen is a left holo hemispheric subdural hematoma. This measures up to 8 mm in maximal dimension at the level of the left frontal lobe. Extension along the falx, with left parafalcine component measuring up to 6 mm. Mild mass effect with associated trace 2 mm left-to-right shift. Overall, appearance is stable from previous MRI. No other definite acute intracranial hemorrhage. No acute large vessel territory infarct. Previously identified punctate left frontal cortical infarct not seen by CT. Underlying atrophy with chronic microvascular ischemic disease noted. No mass lesion. No hydrocephalus. Vascular: No hyperdense vessel. Scattered vascular calcifications noted within the carotid siphons. Skull: Evolving left frontal scalp contusion/hematoma. Calvarium intact. Sinuses/Orbits: Globes and orbital soft tissues within normal limits. Paranasal sinuses and mastoid air cells are clear. Other: None. IMPRESSION: 1. Stable size and appearance of left holo hemispheric subdural hematoma measuring up to 8 mm in maximal dimension. Associated trace 2 mm left-to-right shift. 2. Evolving left frontal scalp contusion/hematoma. 3. No other new acute intracranial process. Electronically Signed   By: Jeannine Boga M.D.   On: 09/21/2018 18:45   Ct Head Wo Contrast  Result Date: 09/19/2018 CLINICAL DATA:  Pt states he fell and hit his head on the bath tub today. Pt denies LOC EXAM: CT HEAD WITHOUT CONTRAST TECHNIQUE: Contiguous axial images were obtained  from the base of the skull through the vertex without intravenous contrast. COMPARISON:  None. FINDINGS: Brain: No evidence of acute infarction, hemorrhage, extra-axial collection, ventriculomegaly, or mass effect. Generalized cerebral atrophy. Periventricular white matter low attenuation likely secondary to microangiopathy. Vascular: Cerebrovascular atherosclerotic calcifications are noted. Skull: Negative for fracture or focal lesion. Sinuses/Orbits: Visualized portions of the orbits are unremarkable. Visualized portions of the paranasal sinuses and mastoid air cells are  unremarkable. Other: Large left frontal scalp hematoma. IMPRESSION: No acute intracranial pathology. Electronically Signed   By: Kathreen Devoid   On: 09/19/2018 15:42   Mr Brain Wo Contrast  Result Date: 09/21/2018 CLINICAL DATA:  Initial evaluation for acute speech difficulty, recent falls. EXAM: MRI HEAD WITHOUT CONTRAST TECHNIQUE: Multiplanar, multiecho pulse sequences of the brain and surrounding structures were obtained without intravenous contrast. COMPARISON:  Prior CT from 09/19/2018. FINDINGS: Brain: Generalized age-related cerebral atrophy with mild chronic small vessel ischemic disease. Remote lacunar infarct present at the right external capsule/lentiform nucleus. Small amount of associated chronic hemosiderin staining. There has been interval development of an acute subdural hematoma overlying the left cerebral convexity. This measures up to 8 mm in maximal diameter. Extension along the falx. Associated trace 2 mm left-to-right shift. No hydrocephalus or ventricular trapping. Basilar cisterns remain patent. 4 mm focus of diffusion abnormality involving the left frontal cortex at the level of the operculum suspicious for a small acute ischemic infarct (series 7, image 22). No associated hemorrhage. No other evidence for acute or subacute ischemia. Gray-white matter differentiation otherwise maintained. Pituitary gland prominent with  abnormal convex border superiorly (series 9, image 12). No other mass lesion. Midline structures intact. Vascular: Major intracranial vascular flow voids are maintained. Skull and upper cervical spine: Craniocervical junction within normal limits. Multilevel degenerative spondylolysis noted within the upper cervical spine without high-grade stenosis. Bone marrow signal intensity within normal limits. Evolving soft tissue contusion/hematoma at the left frontal scalp. Sinuses/Orbits: Globes and orbital soft tissues within normal limits. Paranasal sinuses are clear. Small right mastoid effusion noted, of doubtful significance. Inner ear structures grossly normal. Other: None. IMPRESSION: 1. Delayed intracranial hemorrhage, with interval development of acute left subdural hematoma, measuring up to 8 mm in maximal thickness. Associated mild mass effect with trace 2 mm left-to-right shift. 2. 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 3. Underlying age-related cerebral atrophy with mild chronic microvascular ischemic disease. 4. Abnormal in prominent appearance of the pituitary gland, raising the possibility for an underlying pituitary lesion. Correlation with laboratory values recommended. Additionally, further assessment with dedicated pituitary protocol MRI suggested for further evaluation. This could be performed on a nonemergent outpatient basis. Critical Value/emergent results were called by telephone at the time of interpretation on 09/21/2018 at 2:30 pm to Dr. Fritzi Mandes , who verbally acknowledged these results. Electronically Signed   By: Jeannine Boga M.D.   On: 09/21/2018 14:37   US Abdomen Complete  Result Date: 09/20/2018 CLINICAL DATA:  Elevated liver function tests. EXAM: ABDOMEN ULTRASOUND COMPLETE COMPARISON:  CT abdomen and pelvis 01/20/2017. FINDINGS: Gallbladder: No gallstones or wall thickening visualized. No sonographic Murphy sign noted  by sonographer. Common bile duct: Diameter: 0.2 cm Liver: No focal lesion identified. Within normal limits in parenchymal echogenicity. Portal vein is patent on color Doppler imaging with normal direction of blood flow towards the liver. IVC: No abnormality visualized. Pancreas: Visualized portion unremarkable. Spleen: Size and appearance within normal limits. A few small calcifications in the spleen consistent with old granulomatous disease are seen as on prior CT. Right Kidney: Length: 12.5 cm. Echogenicity within normal limits. No solid mass or hydronephrosis visualized. 7.4 cm in diameter cyst is identified as seen on prior CT. Calcification in the wall of this cyst is unchanged. Left Kidney: Length: 12.5 cm. Echogenicity within normal limits. No solid mass or hydronephrosis visualized. Two simple cysts measuring 2.0 and 1.8 cm in diameter are unchanged. A 1.5 cm  stone in the mid to lower pole is identified and was present on the prior CT. Abdominal aorta: No aneurysm visualized.  Atherosclerosis noted. Other findings: None. IMPRESSION: No acute abnormality.  Normal-appearing liver and gallbladder. Nonobstructing stone lower pole left kidney. Bilateral renal cysts. Atherosclerosis. Electronically Signed   By: Inge Rise M.D.   On: 09/20/2018 10:24    Assessment and plan-  Patient is a 85 y.o. male history of hypertension, history of prostate cancer, presented for evaluation of multiple falls and generalized weakness.  Found to be febrile in the emergency room.  #Thrombocytopenia and anemia, no recent baselines.  Normal folate,   Hepatitis neg, and B12 neg, High LDH, normal haptoglobin, slightly low fibrinogen,  No schistocytes.  ? Peripheral dystruction/ consumption. Platelet count trending up.  Anemia, acute one chronic, blood loss. stable, melena, GI work up,[patient prefers outpatient work up].   Ferritin >7500, likely elevated due to acute phase reactant, check iron, TIBC.  Inappropriate  low reticulocytosis.  ? MDS, MM. Check multiple myeloma panel and light chain. Consider outpatient follow up for BM biopsy.  ? [place],  Afebrile, no splenomegaly, + cytopenia, + high ferritin, Check triglyceride.   # Transaminitis, ?chornic alcohol liver disease.continue to trend.   # History of prostate cancer, outpatient follow up.   #Intracranial hematoma/bleeding, secondary to injury.   neurosurgery on board  Plan discussed with Dr.Patel.  Thank you for allowing me to participate in the care of this patient.  We spent sufficient time to discuss many aspect of care, questions were answered to patient's satisfaction. Total face to face encounter time for this patient visit was 25 min. >50% of the time was  spent in counseling and coordination of care.    Earlie Server, MD, PhD Hematology Oncology [facility] Pager- [phone] 09/22/2018

## 2018-09-22 NOTE — Progress Notes (Signed)
Referring Physician:  No referring provider defined for this encounter.  Primary Physician:  [name], MD  Chief Complaint:  Subdural hematoma  History of Present Illness: 09/22/2018 No new complaints  09/21/2018 [name] is a 85 y.o. male who presents as a consult for subdural hematoma.  States that he fell 2 days ago approximately 4 times in the same day.  Stated that he was standing up too quickly and felt dizzy which resulted in falls.  Denies headache, nausea, vision changes, seizures, weakness.  He does complain of issues with his speech (hard to find words) and a little confusion.   Review of Systems:  A 10 point review of systems is negative, except for the pertinent positives and negatives detailed in the HPI.  Past Medical History: Past Medical History:  Diagnosis Date  . Arthritis   . Cancer [facility])    Prostate Cancer  . Hypertension     Past Surgical History: Past Surgical History:  Procedure Laterality Date  . ESOPHAGOGASTRODUODENOSCOPY (EGD) WITH PROPOFOL N/A 12/06/2017   Procedure: ESOPHAGOGASTRODUODENOSCOPY (EGD) WITH PROPOFOL;  Surgeon: Jonathon Bellows, MD;  Location: [facility] ENDOSCOPY;  Service: Gastroenterology;  Laterality: N/A;  . PROSTATE CRYOABLATION      Allergies: Allergies as of 09/19/2018  . (No Known Allergies)    Medications:  Current Facility-Administered Medications:  .  acetaminophen (TYLENOL) tablet 650 mg, 650 mg, Oral, Q6H PRN, 650 mg at 09/20/18 1944 **OR** acetaminophen (TYLENOL) suppository 650 mg, 650 mg, Rectal, Q6H PRN, Mody, Sital, MD .  doxycycline (VIBRAMYCIN) 100 mg in sodium chloride 0.9 % 250 mL IVPB, 100 mg, Intravenous, Q12H, Ravishankar, Jayashree, MD, Last Rate: 125 mL/hr at 09/22/18 1308, 100 mg at 09/22/18 1308 .  feeding supplement (ENSURE ENLIVE) (ENSURE ENLIVE) liquid 237 mL, 237 mL, Oral, TID BM, Fritzi Mandes, MD, 237 mL at 09/21/18 0911 .  HYDROcodone-acetaminophen (NORCO/VICODIN) 5-325 MG per tablet 1  tablet, 1 tablet, Oral, Q6H PRN, Fritzi Mandes, MD .  lisinopril (ZESTRIL) tablet 20 mg, 20 mg, Oral, Daily, Fritzi Mandes, MD, 20 mg at 09/22/18 1033 .  multivitamin with minerals tablet 1 tablet, 1 tablet, Oral, Daily, Fritzi Mandes, MD, 1 tablet at 09/22/18 1032 .  ondansetron (ZOFRAN) tablet 4 mg, 4 mg, Oral, Q6H PRN **OR** ondansetron (ZOFRAN) injection 4 mg, 4 mg, Intravenous, Q6H PRN, Mody, Sital, MD .  pantoprazole (PROTONIX) EC tablet 40 mg, 40 mg, Oral, BID, Fritzi Mandes, MD, 40 mg at 09/22/18 1033 .  phosphorus (K PHOS NEUTRAL) tablet 500 mg, 500 mg, Oral, Q4H, Oswald Hillock, RPH, 500 mg at 09/22/18 1032 .  polyethylene glycol (MIRALAX / GLYCOLAX) packet 17 g, 17 g, Oral, Daily PRN, Mody, Sital, MD .  potassium chloride SA (K-DUR) CR tablet 40 mEq, 40 mEq, Oral, Daily, Oswald Hillock, RPH, 40 mEq at 09/22/18 1033 .  potassium chloride SA (K-DUR) CR tablet 40 mEq, 40 mEq, Oral, Once, Oswald Hillock, RPH .  vitamin C (ASCORBIC ACID) tablet 250 mg, 250 mg, Oral, BID, Fritzi Mandes, MD, 250 mg at 09/22/18 1032   Social History: Social History   Tobacco Use  . Smoking status: Former Smoker    Types: Pipe    Quit date: 03/30/2002    Years since quitting: 16.4  . Smokeless tobacco: Never Used  Substance Use Topics  . Alcohol use: Yes  . Drug use: Never    Family Medical History: History reviewed. No pertinent family history.  Physical Examination: Vitals:   09/22/18 0730 09/22/18 1627  BP: 113/76 128/77  Pulse: 83 82  Resp: 17 19  Temp: (!) 97.3 F (36.3 C) 98 F (36.7 C)  SpO2: 98% 100%     General: Patient is well developed, well nourished, calm, collected, and in no apparent distress.  Psychiatric: Patient is non-anxious.  Head:  Pupils equal, round, and reactive to light.  ENT:  Oral mucosa appears well hydrated.  Neck:   Supple.  Full range of motion.  Respiratory: Patient is breathing without any difficulty.  Skin:   Large area of ecchymosis on left to midline  forehead and scalp, down into the left side of neck.  NEUROLOGICAL:  General: In no acute distress.   Awake, alert, oriented to person, place, and time.  Pupils equal round and reactive to light.  Facial tone is symmetric.  Tongue protrusion is midline.  There is no pronator drift  Able to identify objects appropriately  Strength: Side Biceps Triceps Grip  R 5 5 5   L 5 5 5    Able to move lower extremities independently   Imaging: EXAM: CT HEAD WITHOUT CONTRAST 09/22/2018  TECHNIQUE: Contiguous axial images were obtained from the base of the skull through the vertex without intravenous contrast.  COMPARISON:  Previous brain MRI from earlier the same day.  FINDINGS: Brain: Again seen is a left holo hemispheric subdural hematoma. This measures up to 8 mm in maximal dimension at the level of the left frontal lobe. Extension along the falx, with left parafalcine component measuring up to 6 mm. Mild mass effect with associated trace 2 mm left-to-right shift. Overall, appearance is stable from previous MRI. No other definite acute intracranial hemorrhage.  No acute large vessel territory infarct. Previously identified punctate left frontal cortical infarct not seen by CT. Underlying atrophy with chronic microvascular ischemic disease noted. No mass lesion. No hydrocephalus.  Vascular: No hyperdense vessel. Scattered vascular calcifications noted within the carotid siphons.  Skull: Evolving left frontal scalp contusion/hematoma. Calvarium intact.  Sinuses/Orbits: Globes and orbital soft tissues within normal limits. Paranasal sinuses and mastoid air cells are clear.  Other: None.  IMPRESSION: 1. Stable size and appearance of left holo hemispheric subdural hematoma measuring up to 8 mm in maximal dimension. Associated trace 2 mm left-to-right shift. 2. Evolving left frontal scalp contusion/hematoma. 3. No other new acute intracranial process.   EXAM: MRI HEAD  WITHOUT CONTRAST 09/21/2018  TECHNIQUE: Multiplanar, multiecho pulse sequences of the brain and surrounding structures were obtained without intravenous contrast.  COMPARISON:  Prior CT from 09/19/2018.  FINDINGS: Brain: Generalized age-related cerebral atrophy with mild chronic small vessel ischemic disease. Remote lacunar infarct present at the right external capsule/lentiform nucleus. Small amount of associated chronic hemosiderin staining.  There has been interval development of an acute subdural hematoma overlying the left cerebral convexity. This measures up to 8 mm in maximal diameter. Extension along the falx. Associated trace 2 mm left-to-right shift. No hydrocephalus or ventricular trapping. Basilar cisterns remain patent.  4 mm focus of diffusion abnormality involving the left frontal cortex at the level of the operculum suspicious for a small acute ischemic infarct (series 7, image 22). No associated hemorrhage. No other evidence for acute or subacute ischemia. Gray-white matter differentiation otherwise maintained.  Pituitary gland prominent with abnormal convex border superiorly (series 9, image 12). No other mass lesion. Midline structures intact.  Vascular: Major intracranial vascular flow voids are maintained.  Skull and upper cervical spine: Craniocervical junction within normal limits. Multilevel degenerative spondylolysis noted within the upper cervical spine  without high-grade stenosis. Bone marrow signal intensity within normal limits. Evolving soft tissue contusion/hematoma at the left frontal scalp.  Sinuses/Orbits: Globes and orbital soft tissues within normal limits. Paranasal sinuses are clear. Small right mastoid effusion noted, of doubtful significance. Inner ear structures grossly normal.  Other: None.  IMPRESSION: 1. Delayed intracranial hemorrhage, with interval development of acute left subdural hematoma, measuring up to 8 mm  in maximal thickness. Associated mild mass effect with trace 2 mm left-to-right shift. 2. 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 3. Underlying age-related cerebral atrophy with mild chronic microvascular ischemic disease. 4. Abnormal in prominent appearance of the pituitary gland, raising the possibility for an underlying pituitary lesion. Correlation with laboratory values recommended. Additionally, further assessment with dedicated pituitary protocol MRI suggested for further evaluation. This could be performed on a nonemergent outpatient basis.  Critical Value/emergent results were called by telephone at the time of interpretation on 09/21/2018 at 2:30 pm to Dr. Fritzi Mandes , who verbally acknowledged these results.  EXAM: CT HEAD WITHOUT CONTRAST 09/19/2018  TECHNIQUE: Contiguous axial images were obtained from the base of the skull through the vertex without intravenous contrast.  COMPARISON:  None.  FINDINGS: Brain: No evidence of acute infarction, hemorrhage, extra-axial collection, ventriculomegaly, or mass effect. Generalized cerebral atrophy. Periventricular white matter low attenuation likely secondary to microangiopathy.  Vascular: Cerebrovascular atherosclerotic calcifications are noted.  Skull: Negative for fracture or focal lesion.  Sinuses/Orbits: Visualized portions of the orbits are unremarkable. Visualized portions of the paranasal sinuses and mastoid air cells are unremarkable.  Other: Large left frontal scalp hematoma.  IMPRESSION: No acute intracranial pathology.  Assessment and Plan: Mr. Krantz is a pleasant 85 y.o. male with 8 mm left subdural hematoma.  CT scan performed after MRI shows stability of hematoma.  Patient remains neurologically intact. Continue to monitor and please advise neurosurgery if there are any changes in mental status.  Otherwise, he may follow-up [facility] neurosurgery in approximately 4 weeks with CT scan prior.   Marin Olp, PA-C Dept. of Neurosurgery

## 2018-09-22 NOTE — Progress Notes (Signed)
Update provided to pt's daugher, Sherri.

## 2018-09-22 NOTE — Progress Notes (Signed)
[name]    MR#:  [id number]  DATE OF BIRTH:  1933-08-29  SUBJECTIVE:  having some intermittent difficulty with speech. No focal weakness No fever since 6/23  REVIEW OF SYSTEMS:   Review of Systems  Constitutional: Negative for chills, fever and weight loss.  HENT: Negative for ear discharge, ear pain and nosebleeds.   Eyes: Negative for blurred vision, pain and discharge.  Respiratory: Negative for sputum production, shortness of breath, wheezing and stridor.   Cardiovascular: Negative for chest pain, palpitations, orthopnea and PND.  Gastrointestinal: Positive for melena. Negative for abdominal pain, diarrhea, nausea and vomiting.  Genitourinary: Negative for frequency and urgency.  Musculoskeletal: Positive for falls and joint pain. Negative for back pain.  Neurological: Positive for weakness. Negative for sensory change, speech change and focal weakness.  Psychiatric/Behavioral: Negative for depression and hallucinations. The patient is not nervous/anxious.    Tolerating Diet:yes Tolerating PT: Out pt PT  DRUG ALLERGIES:  No Known Allergies  VITALS:  Blood pressure 113/76, pulse 83, temperature (!) 97.3 F (36.3 C), temperature source Oral, resp. rate 17, height 6' (1.829 m), weight 60.5 kg, SpO2 98 %.  PHYSICAL EXAMINATION:   Physical Exam  GENERAL:  85 y.o.-year-old patient lying in the bed with no acute distress.  EYES: Pupils equal, round, reactive to light and accommodation. No scleral icterus. Extraocular muscles intact.  HEENT: Head atraumatic, normocephalic. Oropharynx and nasopharynx clear.    NECK:  Supple, no jugular venous distention. No thyroid enlargement, no tenderness.  LUNGS: Normal breath sounds bilaterally, no wheezing, rales, rhonchi. No use of accessory muscles of respiration.  CARDIOVASCULAR: S1, S2 normal. No murmurs, rubs, or gallops.  ABDOMEN: Soft, nontender,  nondistended. Bowel sounds present. No organomegaly or mass.  EXTREMITIES: No cyanosis, clubbing or edema b/l.    NEUROLOGIC: Cranial nerves II through XII are intact. No focal Motor or sensory deficits b/l.  Overall weak, mild dysarthria PSYCHIATRIC:  patient is alert and oriented x 3.  SKIN: No obvious rash, lesion, or ulcer.   LABORATORY PANEL:  CBC Recent Labs  Lab 09/21/18 0654  WBC 3.6*  HGB 8.7*  HCT 25.5*  PLT 52*    Chemistries  Recent Labs  Lab 09/20/18 0533 09/21/18 0654 09/21/18 1300 09/21/18 1541  NA 140 141  --   --   K 3.3* 3.0*  --   --   CL 108 113*  --   --   CO2 19* 19*  --   --   GLUCOSE 105* 103*  --   --   BUN 40* 38*  --   --   CREATININE 1.32* 0.91  --  0.90  CALCIUM 7.9* 7.4*  --   --   MG 1.9  --   --   --   AST  --   --  294*  --   ALT  --   --  114*  --   ALKPHOS  --   --  112  --   BILITOT  --   --  0.7  --    Cardiac Enzymes Recent Labs  Lab 09/20/18 0533  TROPONINI 0.15*   RADIOLOGY:  Ct Head Wo Contrast  Result Date: 09/21/2018 CLINICAL DATA:  Follow-up examination for subdural hemorrhage. EXAM: CT HEAD WITHOUT CONTRAST TECHNIQUE: Contiguous axial images were obtained from the base of the skull through the vertex without intravenous contrast. COMPARISON:  Previous brain MRI from earlier  the same day. FINDINGS: Brain: Again seen is a left holo hemispheric subdural hematoma. This measures up to 8 mm in maximal dimension at the level of the left frontal lobe. Extension along the falx, with left parafalcine component measuring up to 6 mm. Mild mass effect with associated trace 2 mm left-to-right shift. Overall, appearance is stable from previous MRI. No other definite acute intracranial hemorrhage. No acute large vessel territory infarct. Previously identified punctate left frontal cortical infarct not seen by CT. Underlying atrophy with chronic microvascular ischemic disease noted. No mass lesion. No hydrocephalus. Vascular: No hyperdense  vessel. Scattered vascular calcifications noted within the carotid siphons. Skull: Evolving left frontal scalp contusion/hematoma. Calvarium intact. Sinuses/Orbits: Globes and orbital soft tissues within normal limits. Paranasal sinuses and mastoid air cells are clear. Other: None. IMPRESSION: 1. Stable size and appearance of left holo hemispheric subdural hematoma measuring up to 8 mm in maximal dimension. Associated trace 2 mm left-to-right shift. 2. Evolving left frontal scalp contusion/hematoma. 3. No other new acute intracranial process. Electronically Signed   By: Jeannine Boga M.D.   On: 09/21/2018 18:45   Mr Brain Wo Contrast  Result Date: 09/21/2018 CLINICAL DATA:  Initial evaluation for acute speech difficulty, recent falls. EXAM: MRI HEAD WITHOUT CONTRAST TECHNIQUE: Multiplanar, multiecho pulse sequences of the brain and surrounding structures were obtained without intravenous contrast. COMPARISON:  Prior CT from 09/19/2018. FINDINGS: Brain: Generalized age-related cerebral atrophy with mild chronic small vessel ischemic disease. Remote lacunar infarct present at the right external capsule/lentiform nucleus. Small amount of associated chronic hemosiderin staining. There has been interval development of an acute subdural hematoma overlying the left cerebral convexity. This measures up to 8 mm in maximal diameter. Extension along the falx. Associated trace 2 mm left-to-right shift. No hydrocephalus or ventricular trapping. Basilar cisterns remain patent. 4 mm focus of diffusion abnormality involving the left frontal cortex at the level of the operculum suspicious for a small acute ischemic infarct (series 7, image 22). No associated hemorrhage. No other evidence for acute or subacute ischemia. Gray-white matter differentiation otherwise maintained. Pituitary gland prominent with abnormal convex border superiorly (series 9, image 12). No other mass lesion. Midline structures intact. Vascular:  Major intracranial vascular flow voids are maintained. Skull and upper cervical spine: Craniocervical junction within normal limits. Multilevel degenerative spondylolysis noted within the upper cervical spine without high-grade stenosis. Bone marrow signal intensity within normal limits. Evolving soft tissue contusion/hematoma at the left frontal scalp. Sinuses/Orbits: Globes and orbital soft tissues within normal limits. Paranasal sinuses are clear. Small right mastoid effusion noted, of doubtful significance. Inner ear structures grossly normal. Other: None. IMPRESSION: 1. Delayed intracranial hemorrhage, with interval development of acute left subdural hematoma, measuring up to 8 mm in maximal thickness. Associated mild mass effect with trace 2 mm left-to-right shift. 2. 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 3. Underlying age-related cerebral atrophy with mild chronic microvascular ischemic disease. 4. Abnormal in prominent appearance of the pituitary gland, raising the possibility for an underlying pituitary lesion. Correlation with laboratory values recommended. Additionally, further assessment with dedicated pituitary protocol MRI suggested for further evaluation. This could be performed on a nonemergent outpatient basis. Critical Value/emergent results were called by telephone at the time of interpretation on 09/21/2018 at 2:30 pm to Dr. Fritzi Mandes , who verbally acknowledged these results. Electronically Signed   By: Jeannine Boga M.D.   On: 09/21/2018 14:37   US Abdomen Complete  Result Date: 09/20/2018 CLINICAL DATA:  Elevated liver function tests. EXAM: ABDOMEN ULTRASOUND COMPLETE COMPARISON:  CT abdomen and pelvis 01/20/2017. FINDINGS: Gallbladder: No gallstones or wall thickening visualized. No sonographic Murphy sign noted by sonographer. Common bile duct: Diameter: 0.2 cm Liver: No focal lesion identified. Within normal limits in  parenchymal echogenicity. Portal vein is patent on color Doppler imaging with normal direction of blood flow towards the liver. IVC: No abnormality visualized. Pancreas: Visualized portion unremarkable. Spleen: Size and appearance within normal limits. A few small calcifications in the spleen consistent with old granulomatous disease are seen as on prior CT. Right Kidney: Length: 12.5 cm. Echogenicity within normal limits. No solid mass or hydronephrosis visualized. 7.4 cm in diameter cyst is identified as seen on prior CT. Calcification in the wall of this cyst is unchanged. Left Kidney: Length: 12.5 cm. Echogenicity within normal limits. No solid mass or hydronephrosis visualized. Two simple cysts measuring 2.0 and 1.8 cm in diameter are unchanged. A 1.5 cm stone in the mid to lower pole is identified and was present on the prior CT. Abdominal aorta: No aneurysm visualized.  Atherosclerosis noted. Other findings: None. IMPRESSION: No acute abnormality.  Normal-appearing liver and gallbladder. Nonobstructing stone lower pole left kidney. Bilateral renal cysts. Atherosclerosis. Electronically Signed   By: Inge Rise M.D.   On: 09/20/2018 10:24   ASSESSMENT AND PLAN:   85 year old male with history of hypertension who has had decreased appetite for several weeks who presents after multiple falls today and syncopal episode.  1. Syncope with hypotension secondary dehydration, hypokalemia -remains in sinus rhythm  -troponin 0.19-- 0.17--- 0.15 -EKG shows ST inversion and lateral leads. No anticoagulation due to low platelets. - echocardiogram done results pending -holding of aspirin secondary to melena and Subdural hematoma Physical therapy consultation --out pt PT -IV fluids started--changed to bicarb gtt CT head unremarkable -MRI brain today--dysarthria --Cardiology consultation with Dr. Carlus Pavlov present meds -pt denies any cp   2.  Acute kidney injury in the setting of dehydration:    -Hold nephrotoxic medications including blood pressure medications  - received IV fluids.  -Baseline creatinine 0.8 in October 2018 -comes in with creatinine of 1.82--- IV fluids-- 1.32--0.9 d/c IVF -good UOP  3.  Left Subdural Hematoma  S/p fall at home (delayed effects seen on MRI brain) with leftfrontal cortex CVA/infarct -hold ASA -seen by Dr Cari Caraway -Repeat CT head 09/20/18-- stable hematoma  4.  Elevated liver function,LDH low platelet count and anemia - ultrasound abdomen-- no hepatosplenomegaly -seen by Dr Tasia Catchings - PS- no fragmented RBC, mild lymphopenia--less likely TTP, some large lymphoid cells ?plasmacytoid appearance -increased IPF--indicates peripheral destruction of platelets  5.  Fever: etio unclear ?viral -BC 1/4 GPR --bacillus species -d/c vanc, cefepime Chest x-ray and UA are unremarkable. -UC neg -COVID-19 on admission neg -tmax 103--now afebrile -ID consult appreciated USG abdomen--no acute abnormality -cont doxy for possible tick bite illness  6.  Hypertension -resumed Lisinopril  7. HYpokalemia: REplete  - MG within normal limits  8. History of prostate cancer -pelvic x-rays  Shows diffuse osteopenia  9.Acute on Chronic anemia with melena  -GI saw pt--colonoscopy as out pt (pt preference) -EGD 11/2017--Barretts esophagus -hgb 9.0--10.0--8.7  Spoke with daughter [name] updated 6/23  CODE STATUS: full  DVT Prophylaxis: SCD  TOTAL TIME TAKING CARE OF THIS PATIENT: *30* minutes.  >50% time spent on counselling and coordination of care  POSSIBLE D/C IN 1 to 2* DAYS, DEPENDING ON CLINICAL CONDITION.  Note: This dictation was prepared with Dragon dictation along with smaller  Company secretary. Any transcriptional errors that result from this process are unintentional.  Fritzi Mandes M.D on 09/22/2018 at 8:47 AM  Between 7am to 6pm - Pager - [phone]  After 6pm go to [email or web address] - password [email or web address] [facility]  Office   [phone]  CC: Primary care physician; [name], MDPatient ID: [name], male   DOB: [birth year], 85 y.o.   MRN: [id number]

## 2018-09-22 NOTE — Progress Notes (Signed)
Physical Therapy Treatment Patient Details Name: [name] MRN: [id number] DOB: [birth year] Today's Date: 09/22/2018    History of Present Illness Pt admitted for syncope with multiple falls at home. Complains of hip pain upon arrival to hospital. History includes HTN and prostate cancer.  Noted multiple abrasions to body     PT Comments    Pt presented today with increased AMS, difficulty in following directions and reported weakness and unspecified pain.  Pt was willing to attempt sitting up at side of bed with need for hand held assist but placed his head in his hands and stated "I can't do this today", falling (controlled) back onto the bed.  Pt was able to reposition in bed with VC's but was unable to follow directions for supine there ex.  Pt oriented to self and place but unable to report date or situation.  Pt attempted to show PT pain location, rubbing his abdomen and then gesturing towards his hips but was unable to produce an explanation verbally.  Pt will continue to benefit from skilled PT with focus on strength, safe functional mobility and balance.  Discharge plan updated to SNF due to significant change in pt mobility and mental status.  Follow Up Recommendations  SNF     Equipment Recommendations  Other (comment)(TBD at next venue of care.)    Recommendations for Other Services       Precautions / Restrictions Precautions Precautions: Fall Restrictions Weight Bearing Restrictions: No    Mobility  Bed Mobility Overal bed mobility: Needs Assistance Bed Mobility: Supine to Sit           General bed mobility comments: Difficulty in sitting upright today.  Required handheld assist and was not able to sit up long.  Placed his head in his hands, stating that he felt "weak and dizzy" and "can't do this today", then fell (controlled) back to supine.  Transfers Overall transfer level: (Pt not appropriate to perform transfers today.)                   Ambulation/Gait         Gait velocity: 10' in 7 seconds       Stairs             Wheelchair Mobility    Modified Rankin (Stroke Patients Only)       Balance Overall balance assessment: History of Falls;Needs assistance Sitting-balance support: Feet supported Sitting balance-Leahy Scale: Poor                                      Cognition Arousal/Alertness: Awake/alert Behavior During Therapy: Restless Overall Cognitive Status: Impaired/Different from baseline Area of Impairment: Orientation;Memory                 Orientation Level: Disoriented to;Time;Situation   Memory: Decreased short-term memory         General Comments: Pt having difficulty with word finding.      Exercises Other Exercises Other Exercises: PT attempted to review supine ther ex with pt but he had difficulty following directions, ultimately unable to complete full sets of ankle pumps, pillow squeezes and heel slides.    General Comments        Pertinent Vitals/Pain Pain Assessment: Faces Faces Pain Scale: Hurts little more Pain Location: Pt unable to specify pain location but places his hand on his abdomen several times. Pain Descriptors / Indicators:  Grimacing    Home Living                      Prior Function            PT Goals (current goals can now be found in the care plan section) Acute Rehab PT Goals Patient Stated Goal: to go home PT Goal Formulation: With patient Time For Goal Achievement: 10/04/18 Potential to Achieve Goals: Good Progress towards PT goals: Not progressing toward goals - comment(Pt unable to perform transfers today due to increase weakness and AMS.)    Frequency    Min 2X/week      PT Plan Discharge plan needs to be updated    Co-evaluation              AM-PAC PT "6 Clicks" Mobility   Outcome Measure  Help needed turning from your back to your side while in a flat bed without using  bedrails?: A Lot Help needed moving from lying on your back to sitting on the side of a flat bed without using bedrails?: A Lot Help needed moving to and from a bed to a chair (including a wheelchair)?: A Lot Help needed standing up from a chair using your arms (e.g., wheelchair or bedside chair)?: A Lot Help needed to walk in hospital room?: Total Help needed climbing 3-5 steps with a railing? : Total 6 Click Score: 10    End of Session   Activity Tolerance: Other (comment)(Limited by AMS and weakness.) Patient left: in bed(Speech therapist in room when PT departed.)   PT Visit Diagnosis: Unsteadiness on feet (R26.81);Muscle weakness (generalized) (M62.81);History of falling (Z91.81);Difficulty in walking, not elsewhere classified (R26.2);Dizziness and giddiness (R42)     Time: 1145-1200 PT Time Calculation (min) (ACUTE ONLY): 15 min  Charges:  $Therapeutic Activity: 8-22 mins                     Roxanne Gates, PT, DPT   Roxanne Gates 09/22/2018, 12:35 PM

## 2018-09-23 ENCOUNTER — Inpatient Hospital Stay: Payer: Medicare Other

## 2018-09-23 LAB — ROCKY MTN SPOTTED FVR ABS PNL(IGG+IGM)
RMSF IgG: POSITIVE — AB
RMSF IgM: 0.11 index (ref 0.00–0.89)

## 2018-09-23 LAB — RMSF, IGG, IFA: RMSF, IGG, IFA: 1:64 {titer} — ABNORMAL HIGH

## 2018-09-23 LAB — ECHOCARDIOGRAM COMPLETE
Height: 72 in
Weight: 2134.4 oz

## 2018-09-23 LAB — POTASSIUM: Potassium: 3.4 mmol/L — ABNORMAL LOW (ref 3.5–5.1)

## 2018-09-23 LAB — TRIGLYCERIDES: Triglycerides: 295 mg/dL — ABNORMAL HIGH (ref <150)

## 2018-09-23 LAB — PREPARE RBC (CROSSMATCH)

## 2018-09-23 LAB — PHOSPHORUS: Phosphorus: 2.3 mg/dL — ABNORMAL LOW (ref 2.5–4.6)

## 2018-09-23 LAB — ABO/RH: ABO/RH(D): A POS

## 2018-09-23 MED ORDER — POTASSIUM CHLORIDE IN NACL 40-0.9 MEQ/L-% IV SOLN
INTRAVENOUS | Status: DC
  Administered 2018-09-23 – 2018-09-24 (×3): 75 mL/h via INTRAVENOUS
  Filled 2018-09-23 (×6): qty 1000

## 2018-09-23 MED ORDER — POTASSIUM CHLORIDE CRYS ER 20 MEQ PO TBCR: 40.0000 meq | EXTENDED_RELEASE_TABLET | Freq: Once | ORAL | Status: DC

## 2018-09-23 MED ORDER — POTASSIUM PHOSPHATES 15 MMOLE/5ML IV SOLN
10.0000 mmol | Freq: Once | INTRAVENOUS | Status: AC
  Administered 2018-09-23: 10 mmol via INTRAVENOUS
  Filled 2018-09-23: qty 3.33

## 2018-09-23 MED ORDER — PANTOPRAZOLE SODIUM 40 MG IV SOLR
40.0000 mg | Freq: Two times a day (BID) | INTRAVENOUS | Status: DC
  Administered 2018-09-23 – 2018-10-14 (×41): 40 mg via INTRAVENOUS
  Filled 2018-09-23 (×40): qty 40

## 2018-09-23 MED ORDER — K PHOS MONO-SOD PHOS DI & MONO 155-852-130 MG PO TABS
500.0000 mg | ORAL_TABLET | Freq: Two times a day (BID) | ORAL | Status: DC
  Filled 2018-09-23 (×2): qty 2

## 2018-09-23 MED ORDER — SODIUM CHLORIDE 0.9 % IV SOLN
100.0000 mg | Freq: Two times a day (BID) | INTRAVENOUS | Status: AC
  Administered 2018-09-23 – 2018-09-28 (×11): 100 mg via INTRAVENOUS
  Filled 2018-09-23 (×12): qty 100

## 2018-09-23 MED ORDER — SODIUM CHLORIDE 0.9% IV SOLUTION: Freq: Once | INTRAVENOUS | Status: DC

## 2018-09-23 MED ORDER — DOXYCYCLINE HYCLATE 100 MG PO TABS: 100.0000 mg | ORAL_TABLET | Freq: Two times a day (BID) | ORAL | Status: DC

## 2018-09-23 MED ORDER — KETOROLAC TROMETHAMINE 15 MG/ML IJ SOLN
15.0000 mg | Freq: Three times a day (TID) | INTRAMUSCULAR | Status: DC | PRN
  Administered 2018-09-23: 15 mg via INTRAVENOUS
  Filled 2018-09-23 (×2): qty 1

## 2018-09-23 NOTE — Consult Note (Addendum)
PHARMACY CONSULT NOTE - FOLLOW UP  Pharmacy Consult for Electrolyte Monitoring and Replacement   Recent Labs: Potassium (mmol/L)  Date Value  09/23/2018 3.4 (L)   Magnesium (mg/dL)  Date Value  09/20/2018 1.9   Calcium (mg/dL)  Date Value  09/22/2018 7.4 (L)   Albumin (g/dL)  Date Value  09/22/2018 2.1 (L)   Phosphorus (mg/dL)  Date Value  09/23/2018 2.3 (L)   Sodium (mmol/L)  Date Value  09/22/2018 138   Corrected Ca: 8.7 Scr 1.81>0.91  Assessment: Patient admitted with  Hypokalemia. Pharmacy consulted for electrolyte monitoring and replacement. Kphos neutral 1 out of 2 doses were given. It seems a dose of Kphos is always missed. I timed the supplements further a part. Hopefully it is not missed. KCl 40 mEq extra dose was not given yesterday.   Goal of Therapy:  Electrolytes WNL  Plan:  K+ 3.1. Will order KCL 68mEq daily + an extra KCl 40 mEq x 1.  Phos 2.3. Will order Kphos neutral tablets 2 tab every 4 hours x 2.   Will F/U with AM labs and continue to replace electrolytes as needed.   Oswald Hillock, PharmD, BCPS Clinical Pharmacist 09/23/2018 7:28 AM

## 2018-09-23 NOTE — Progress Notes (Signed)
Date of Admission:  09/19/2018      ID:    Subjective: Says he is exhausted No more fever Trouble swallowing   Medications:  . sodium chloride   Intravenous Once  . doxycycline  100 mg Oral Q12H  . feeding supplement (ENSURE ENLIVE)  237 mL Oral TID BM  . lisinopril  20 mg Oral Daily  . multivitamin with minerals  1 tablet Oral Daily  . pantoprazole  40 mg Oral BID  . phosphorus  500 mg Oral BID  . potassium chloride  40 mEq Oral Daily  . potassium chloride  40 mEq Oral Once  . vitamin C  250 mg Oral BID    Objective: Vital signs in last 24 hours: Temp:  [97.9 F (36.6 C)-98.7 F (37.1 C)] 98.4 F (36.9 C) (06/26 0739) Pulse Rate:  [81-88] 81 (06/26 0739) Resp:  [16-19] 19 (06/26 0739) BP: (110-151)/(77-85) 139/85 (06/26 0739) SpO2:  [98 %-100 %] 100 % (06/26 0739)  PHYSICAL EXAM:  General: Alert, cooperative, no distress, frail- left face hematoma Lungs: b/l air entry Heart: s1s2 Abdomen: Soft, Extremities: atraumatic, no cyanosis. No edema. No clubbing Skin: No rashes or lesions. Or bruising Lymph: Cervical, supraclavicular normal. Neurologic: Grossly non-focal  Lab Results Recent Labs    09/21/18 0654 09/21/18 1541 09/22/18 0826 09/23/18 0608  WBC 3.6*  --  4.8  --   HGB 8.7*  --  7.6*  --   HCT 25.5*  --  22.7*  --   NA 141  --  138  --   K 3.0*  --  3.1* 3.4*  CL 113*  --  109  --   CO2 19*  --  22  --   BUN 38*  --  29*  --   CREATININE 0.91 0.90 0.87  --    Liver Panel Recent Labs    09/21/18 1300 09/22/18 0826  PROT 4.4* 4.1*  ALBUMIN 2.4* 2.1*  AST 294* 297*  ALT 114* 127*  ALKPHOS 112 113  BILITOT 0.7 0.8  BILIDIR 0.4* 0.3*  IBILI 0.3 0.5   Sedimentation Rate No results for input(s): ESRSEDRATE in the last 72 hours. C-Reactive Protein No results for input(s): CRP in the last 72 hours.  Microbiology:  Studies/Results: Ct Head Wo Contrast  Result Date: 09/21/2018 CLINICAL DATA:  Follow-up examination for subdural  hemorrhage. EXAM: CT HEAD WITHOUT CONTRAST TECHNIQUE: Contiguous axial images were obtained from the base of the skull through the vertex without intravenous contrast. COMPARISON:  Previous brain MRI from earlier the same day. FINDINGS: Brain: Again seen is a left holo hemispheric subdural hematoma. This measures up to 8 mm in maximal dimension at the level of the left frontal lobe. Extension along the falx, with left parafalcine component measuring up to 6 mm. Mild mass effect with associated trace 2 mm left-to-right shift. Overall, appearance is stable from previous MRI. No other definite acute intracranial hemorrhage. No acute large vessel territory infarct. Previously identified punctate left frontal cortical infarct not seen by CT. Underlying atrophy with chronic microvascular ischemic disease noted. No mass lesion. No hydrocephalus. Vascular: No hyperdense vessel. Scattered vascular calcifications noted within the carotid siphons. Skull: Evolving left frontal scalp contusion/hematoma. Calvarium intact. Sinuses/Orbits: Globes and orbital soft tissues within normal limits. Paranasal sinuses and mastoid air cells are clear. Other: None. IMPRESSION: 1. Stable size and appearance of left holo hemispheric subdural hematoma measuring up to 8 mm in maximal dimension. Associated trace 2 mm left-to-right shift. 2. Evolving  left frontal scalp contusion/hematoma. 3. No other new acute intracranial process. Electronically Signed   By: Jeannine Boga M.D.   On: 09/21/2018 18:45     Assessment/Plan: Pt admitted with syncope, fall, trauma   Syncope could be from postural hypotension .acute anemia, need to r/o cardiac cause -echo done but result pending  Fever with pancytopenia- wit h/o tick exposure ehrlichia/RMSF is a concern -- started on Doxy- fever has resolved. He will get DOXY until 09/29/18  Gram positive rod in 1 of 4 blood culture- bacillus a  contaminant- no need to treat  Abnormal LFTS, high  LDH, high ferritin, low normal haptoglobulin- With pancytopenia- r/o HLH, MDS,  Myeloma is being questioned because of plasmacytoid cells- Panel sent  H/o prostate ca- had cryoablation-  R/o mets  Discussed the management with patient and Dr.Patel ID will follow him peripherally this weekend-call if needed

## 2018-09-23 NOTE — Plan of Care (Signed)
Pt remains NPO due to very high aspiration risk.  Receiving transfusion of 1 unit of PRBCs.   Problem: Clinical Measurements: Goal: Diagnostic test results will improve Outcome: Not Progressing   Problem: Nutrition: Goal: Adequate nutrition will be maintained Outcome: Not Progressing   Problem: Education: Goal: Knowledge of General Education information will improve Description: Including pain rating scale, medication(s)/side effects and non-pharmacologic comfort measures Outcome: Progressing   Problem: Health Behavior/Discharge Planning: Goal: Ability to manage health-related needs will improve Outcome: Progressing   Problem: Clinical Measurements: Goal: Ability to maintain clinical measurements within normal limits will improve Outcome: Progressing Goal: Will remain free from infection Outcome: Progressing Goal: Respiratory complications will improve Outcome: Progressing Goal: Cardiovascular complication will be avoided Outcome: Progressing   Problem: Activity: Goal: Risk for activity intolerance will decrease Outcome: Progressing   Problem: Coping: Goal: Level of anxiety will decrease Outcome: Progressing   Problem: Elimination: Goal: Will not experience complications related to bowel motility Outcome: Progressing Goal: Will not experience complications related to urinary retention Outcome: Progressing   Problem: Pain Managment: Goal: General experience of comfort will improve Outcome: Progressing   Problem: Safety: Goal: Ability to remain free from injury will improve Outcome: Progressing   Problem: Skin Integrity: Goal: Risk for impaired skin integrity will decrease Outcome: Progressing

## 2018-09-23 NOTE — Consult Note (Signed)
PHARMACY CONSULT NOTE - FOLLOW UP  Pharmacy Consult for Electrolyte Monitoring and Replacement   Recent Labs: Potassium (mmol/L)  Date Value  09/23/2018 3.4 (L)   Magnesium (mg/dL)  Date Value  09/20/2018 1.9   Calcium (mg/dL)  Date Value  09/22/2018 7.4 (L)   Albumin (g/dL)  Date Value  09/22/2018 2.1 (L)   Phosphorus (mg/dL)  Date Value  09/23/2018 2.3 (L)   Sodium (mmol/L)  Date Value  09/22/2018 138   Corrected Ca: 8.7 Scr 1.81>0.91  Assessment: Patient admitted with  Hypokalemia. Pharmacy consulted for electrolyte monitoring and replacement. Kphos neutral 1 out of 2 doses were given. It seems a dose of Kphos is always missed. I timed the supplements further a part. Hopefully it is not missed. KCl 40 mEq extra dose was not given yesterday.   Goal of Therapy:  Electrolytes WNL  Plan:  K+ 3.1. Will order KCL 91mEq daily + an extra KCl 40 mEq x 1.  Phos 2.3. Will order Kphos neutral tablets 2 tab every 4 hours x 2.   Addendum - pt failed swallow screen - all meds changed to IV  Resumed NS with 40 meq KCl @ 55ml/hr and changed KPhos order to 27mmol IV  Will F/U with AM labs and continue to replace electrolytes as needed.   Lu Duffel, PharmD, BCPS Clinical Pharmacist 09/23/2018 3:11 PM

## 2018-09-23 NOTE — Progress Notes (Signed)
Update provided to pt and his daughter, [name].

## 2018-09-23 NOTE — Progress Notes (Signed)
Received verbal report from SLP regarding barium swallow study results.  Pt is to remain strict NPO, provide oral care frequently, and deliver medication via route other than PO.

## 2018-09-23 NOTE — Evaluation (Signed)
Objective Swallowing Evaluation: Type of Study: MBS-Modified Barium Swallow Study   Patient Details  Name: [name] MRN: [id number] Date of Birth: 10-01-1933  Today's Date: 09/23/2018 Time: SLP Start Time (ACUTE ONLY): 6270 -SLP Stop Time (ACUTE ONLY): 1242  SLP Time Calculation (min) (ACUTE ONLY): 60 min   Past Medical History:  Past Medical History:  Diagnosis Date  . Arthritis   . Cancer [facility])    Prostate Cancer  . Hypertension    Past Surgical History:  Past Surgical History:  Procedure Laterality Date  . ESOPHAGOGASTRODUODENOSCOPY (EGD) WITH PROPOFOL N/A 12/06/2017   Procedure: ESOPHAGOGASTRODUODENOSCOPY (EGD) WITH PROPOFOL;  Surgeon: Jonathon Bellows, MD;  Location: [facility] ENDOSCOPY;  Service: Gastroenterology;  Laterality: N/A;  . PROSTATE CRYOABLATION     HPI: [name]  is a 85 y.o. male with a known history of hypertension who presents today to the emergency room due to for falls today at home.  Patient reports for the past 3 days he has had dizziness and feelings of lightheadedness.  He denies chest pain, shortness of breath, cough, fever or urinary symptoms.  He was noted to have large amount of bruising on the left side of his forehead and skin tears.  His blood pressure was also noted to be low when EMS arrived.  He was given 250 cc normal saline bolus.     Subjective: Pt alert and verbally responsive.  Dysfluency appears to be improved.    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  He has severely restricted hyolaryngeal excursion which prevents an effective pharyngeal swallow with most of a puree bolus remaining in the pharynx, never entering the esophagus.  Nectar-thick liquid is aspirated during the swallow.  He has a  weak, ineffective cough response.  The patient is currently unsafe for any POs.  Recommend continuing NPO status with no oral meds.  Recommend frequent oral care for oral hydration and stimulation.  SLP Visit Diagnosis Dysphagia, oropharyngeal phase (R13.12)  Attention and concentration deficit following --  Frontal lobe and executive function deficit following --  Impact on safety and function Severe aspiration risk;Risk for inadequate nutrition/hydration      CHL IP TREATMENT RECOMMENDATION 09/23/2018  Treatment Recommendations F/U MBS in --- days (Comment)     Prognosis 09/23/2018  Prognosis for Safe Diet Advancement Guarded  Barriers to Reach Goals Severity of deficits  Barriers/Prognosis Comment --    CHL IP DIET RECOMMENDATION 09/23/2018  SLP Diet Recommendations NPO  Liquid Administration via --  Medication Administration Via alternative means  Compensations --  Postural Changes --      CHL IP OTHER RECOMMENDATIONS 09/23/2018  Recommended Consults --  Oral Care Recommendations Oral care QID;Staff/trained caregiver to provide oral care  Other Recommendations --      CHL IP FOLLOW UP RECOMMENDATIONS 09/22/2018  Follow up Recommendations Home health SLP      CHL IP FREQUENCY AND DURATION 09/23/2018  Speech Therapy Frequency (ACUTE ONLY) min 3x week  Treatment Duration 2 weeks           CHL IP ORAL PHASE 09/23/2018  Oral Phase WFL  Oral - Pudding Teaspoon --  Oral - Pudding Cup --  Oral - Honey Teaspoon --  Oral - Honey Cup --  Oral - Nectar Teaspoon --  Oral - Nectar Cup --  Oral - Nectar Straw --  Oral - Thin Teaspoon --  Oral - Thin Cup --  Oral - Thin Straw --  Oral - Puree --  Oral - Mech Soft --  Oral - Regular --  Oral - Multi-Consistency --  Oral - Pill --  Oral Phase - Comment --    CHL IP PHARYNGEAL PHASE 09/23/2018  Pharyngeal Phase Impaired  Pharyngeal- Pudding Teaspoon --  Pharyngeal --  Pharyngeal- Pudding Cup --  Pharyngeal --  Pharyngeal-  Honey Teaspoon --  Pharyngeal --  Pharyngeal- Honey Cup --  Pharyngeal --  Pharyngeal- Nectar Teaspoon --  Pharyngeal --  Pharyngeal- Nectar Cup --  Pharyngeal --  Pharyngeal- Nectar Straw --  Pharyngeal --  Pharyngeal- Thin Teaspoon --  Pharyngeal --  Pharyngeal- Thin Cup --  Pharyngeal --  Pharyngeal- Thin Straw --  Pharyngeal --  Pharyngeal- Puree --  Pharyngeal --  Pharyngeal- Mechanical Soft --  Pharyngeal --  Pharyngeal- Regular --  Pharyngeal --  Pharyngeal- Multi-consistency --  Pharyngeal --  Pharyngeal- Pill --  Pharyngeal --  Pharyngeal Comment PUREE: initial attempt to initiate pharyngeal swallow occurs while falling from the valleculae to the pyriform sinuses.  Patient required 5 attempts to initiate pharyngeal swallow and then only a trace amount enters the esophagus.  There in minimal, if any, hyolaryngeal excursion.  There is severe vallecular residue and moderate residue through the rest of the pharynx.  NECTAR-THICK LIQUID: there is gross aspiration during the swallow with slightly more of the bolus entering the esophagus.  The patient has a weak and ineffective cough response.     CHL IP CERVICAL ESOPHAGEAL PHASE 09/23/2018  Cervical Esophageal Phase Impaired  Pudding Teaspoon --  Pudding Cup --  Honey Teaspoon --  Honey Cup --  Nectar Teaspoon --  Nectar Cup --  Nectar Straw --  Thin Teaspoon --  Thin Cup --  Thin Straw --  Puree --  Mechanical Soft --  Regular --  Multi-consistency --  Pill --  Cervical Esophageal Comment Potential poor relaxation to open UES in conjunction with minimal hyolaryngeal excursion    Leroy Sea, MS/CCC- SLP  Lou Miner 09/23/2018, 12:42 PM

## 2018-09-23 NOTE — Progress Notes (Signed)
Physical Therapy Treatment Patient Details Name: [name] MRN: [id number] DOB: 1933/10/03 Today's Date: 09/23/2018    History of Present Illness Pt admitted for syncope with multiple falls at home. Complains of hip pain upon arrival to hospital. History includes HTN and prostate cancer.  Noted multiple abrasions to body     PT Comments    Pt continues to have dizziness between transfers, that subsides following stabilizing in position for a couple mins. Patient able to complete supine to sit with good effort and minA for trunk motion. Patient is able to remain sitting EOB without assistance. Over 2 trials of STS patient is able to comply with cuing for hand placement and set up with good carry over over second trial with CGA for safety. In standing patient reports fatigue, and is able to manage bed > chair transfer only CGA. Dizziness with sitting that subsides. Would benefit from skilled PT to address above deficits and promote optimal return to PLOF    Follow Up Recommendations  SNF     Equipment Recommendations  Other (comment)    Recommendations for Other Services       Precautions / Restrictions      Mobility  Bed Mobility Overal bed mobility: Needs Assistance Bed Mobility: Supine to Sit     Supine to sit: Min assist     General bed mobility comments: Pt able to complete transfer with minA at trunk and good use of UE at bedrails to assist with transfer  Transfers   Equipment used: Rolling walker (2 wheeled) Transfers: Sit to/from Stand Sit to Stand: Min guard         General transfer comment: Patient able to complete stand following cuing for set up and hand placement with good carry over following. Able to remain standing with RW, reports he does not know if he can do any more. After sitting back down, completes second STS transfer to move to chair with good caarry over of safety cuing.  Ambulation/Gait Ambulation/Gait assistance: Min guard Gait  Distance (Feet): 3 Feet Assistive device: Rolling walker (2 wheeled) Gait Pattern/deviations: Step-through pattern;Decreased step length - left     General Gait Details: ambulated with slight shuffling gait, however improved once in hallway. No LOB and follows directions well. No dizziness symptoms.   Stairs             Wheelchair Mobility    Modified Rankin (Stroke Patients Only)       Balance                                            Cognition                                              Exercises Other Exercises Other Exercises: Supine to sit with good effort of LE with assistance at trunk, with good efoot through UE with bed rail. CUing for set up Other Exercises: STS x2 trials with cuing through first trial for set up and hand placement with RW with good carry over of this during second trila. Patient unsteady with standing with quick fatigue Other Exercises: Patient able to complete transfer with short steps to chair with RW with CGA and cuing for RW safety and controlled lowering  to chair.    General Comments        Pertinent Vitals/Pain Pain Assessment: No/denies pain    Home Living                      Prior Function            PT Goals (current goals can now be found in the care plan section) Acute Rehab PT Goals Patient Stated Goal: to go home PT Goal Formulation: With patient Time For Goal Achievement: 10/04/18 Potential to Achieve Goals: Fair Progress towards PT goals: Progressing toward goals    Frequency    Min 2X/week      PT Plan Discharge plan needs to be updated    Co-evaluation              AM-PAC PT "6 Clicks" Mobility   Outcome Measure  Help needed turning from your back to your side while in a flat bed without using bedrails?: A Lot Help needed moving from lying on your back to sitting on the side of a flat bed without using bedrails?: A Lot Help needed moving to and  from a bed to a chair (including a wheelchair)?: A Lot Help needed standing up from a chair using your arms (e.g., wheelchair or bedside chair)?: A Lot Help needed to walk in hospital room?: Total Help needed climbing 3-5 steps with a railing? : Total 6 Click Score: 10    End of Session Equipment Utilized During Treatment: Gait belt Activity Tolerance: Patient tolerated treatment well Patient left: in chair;with chair alarm set;with call bell/phone within reach Nurse Communication: Mobility status PT Visit Diagnosis: Unsteadiness on feet (R26.81);Muscle weakness (generalized) (M62.81);History of falling (Z91.81);Difficulty in walking, not elsewhere classified (R26.2);Dizziness and giddiness (R42)     Time: 1000-1014 PT Time Calculation (min) (ACUTE ONLY): 14 min  Charges:  $Therapeutic Activity: 8-22 mins                     Shelton Silvas PT, DPT   Shelton Silvas 09/23/2018, 11:07 AM

## 2018-09-23 NOTE — NC FL2 (Signed)
[place] LEVEL OF CARE SCREENING TOOL     IDENTIFICATION  Patient Name: [name] Birthdate: 1934-02-12 Sex: male Admission Date (Current Location): 09/19/2018  [place] and [place] Number:  Engineering geologist and Address:  [facility], [address], [place], [place] [address]      Provider Number: [id number]  Attending Physician Name and Address:  Fritzi Mandes, MD  Relative Name and Phone Number:  Bertell Maria Daughter [phone]    Current Level of Care: Hospital Recommended Level of Care: [place] Prior Approval Number:    Date Approved/Denied:   PASRR Number: [id number]  Discharge Plan: SNF    Current Diagnoses: Patient Active Problem List   Diagnosis Date Noted  . Anemia   . Thrombocytopenia ([place])   . Blood in stool   . Elevated transaminase level   . Syncope 09/19/2018    Orientation RESPIRATION BLADDER Height & Weight     Self, Time, Situation, Place  Normal Incontinent Weight: 60.5 kg Height:  6' (182.9 cm)  BEHAVIORAL SYMPTOMS/MOOD NEUROLOGICAL BOWEL NUTRITION STATUS      Continent Diet(currently NPO)  AMBULATORY STATUS COMMUNICATION OF NEEDS Skin   Limited Assist Verbally Normal                       Personal Care Assistance Level of Assistance  Bathing, Feeding, Dressing Bathing Assistance: Limited assistance Feeding assistance: Independent Dressing Assistance: Limited assistance     Functional Limitations Info  Sight, Hearing, Speech Sight Info: Adequate Hearing Info: Adequate Speech Info: Adequate    SPECIAL CARE FACTORS FREQUENCY  PT (By licensed PT)     PT Frequency: 5 x week              Contractures Contractures Info: Not present    Additional Factors Info  Code Status, Allergies Code Status Info: full Allergies Info: NKA           Current Medications (09/23/2018):  This is the current hospital active medication list Current  Facility-Administered Medications  Medication Dose Route Frequency Provider Last Rate Last Dose  . 0.9 %  sodium chloride infusion (Manually program via Guardrails IV Fluids)   Intravenous Once Fritzi Mandes, MD      . acetaminophen (TYLENOL) tablet 650 mg  650 mg Oral Q6H PRN Bettey Costa, MD   650 mg at 09/20/18 1944   Or  . acetaminophen (TYLENOL) suppository 650 mg  650 mg Rectal Q6H PRN Mody, Sital, MD      . doxycycline (VIBRA-TABS) tablet 100 mg  100 mg Oral Q12H Ravishankar, Jayashree, MD      . feeding supplement (ENSURE ENLIVE) (ENSURE ENLIVE) liquid 237 mL  237 mL Oral TID BM Fritzi Mandes, MD   Stopped at 09/22/18 2010  . HYDROcodone-acetaminophen (NORCO/VICODIN) 5-325 MG per tablet 1 tablet  1 tablet Oral Q6H PRN Fritzi Mandes, MD      . lisinopril (ZESTRIL) tablet 20 mg  20 mg Oral Daily Fritzi Mandes, MD   20 mg at 09/22/18 1033  . multivitamin with minerals tablet 1 tablet  1 tablet Oral Daily Fritzi Mandes, MD   1 tablet at 09/22/18 1032  . ondansetron (ZOFRAN) tablet 4 mg  4 mg Oral Q6H PRN Mody, Sital, MD       Or  . ondansetron (ZOFRAN) injection 4 mg  4 mg Intravenous Q6H PRN Mody, Sital, MD      . pantoprazole (PROTONIX) EC tablet 40 mg  40 mg Oral BID Fritzi Mandes, MD   Stopped at 09/22/18 2011  . phosphorus (K PHOS NEUTRAL) tablet 500 mg  500 mg Oral BID Eleonore Chiquito S, RPH      . polyethylene glycol (MIRALAX / GLYCOLAX) packet 17 g  17 g Oral Daily PRN Mody, Sital, MD      . potassium chloride SA (K-DUR) CR tablet 40 mEq  40 mEq Oral Daily Oswald Hillock, RPH   40 mEq at 09/22/18 1033  . potassium chloride SA (K-DUR) CR tablet 40 mEq  40 mEq Oral Once Oswald Hillock, RPH      . vitamin C (ASCORBIC ACID) tablet 250 mg  250 mg Oral BID Fritzi Mandes, MD   Stopped at 09/22/18 2011     Discharge Medications: Please see discharge summary for a list of discharge medications.  Relevant Imaging Results:  Relevant Lab Results:   Additional Information    Elza Rafter,  RN

## 2018-09-23 NOTE — Progress Notes (Signed)
[facility] at [place] NAME: [name]    MR#:  [id number]  DATE OF BIRTH:  19-Nov-1933  SUBJECTIVE:  having some intermittent difficulty with speech. No focal weakness No fever since 6/23 Npo today for MBS REVIEW OF SYSTEMS:   Review of Systems  Constitutional: Negative for chills, fever and weight loss.  HENT: Negative for ear discharge, ear pain and nosebleeds.   Eyes: Negative for blurred vision, pain and discharge.  Respiratory: Negative for sputum production, shortness of breath, wheezing and stridor.   Cardiovascular: Negative for chest pain, palpitations, orthopnea and PND.  Gastrointestinal: Positive for melena. Negative for abdominal pain, diarrhea, nausea and vomiting.  Genitourinary: Negative for frequency and urgency.  Musculoskeletal: Positive for falls and joint pain. Negative for back pain.  Neurological: Positive for weakness. Negative for sensory change, speech change and focal weakness.  Psychiatric/Behavioral: Negative for depression and hallucinations. The patient is not nervous/anxious.    Tolerating Diet:yes Tolerating PT: SNF  DRUG ALLERGIES:  No Known Allergies  VITALS:  Blood pressure 139/85, pulse 81, temperature 98.4 F (36.9 C), temperature source Oral, resp. rate 19, height 6' (1.829 m), weight 60.5 kg, SpO2 100 %.  PHYSICAL EXAMINATION:   Physical Exam  GENERAL:  85 y.o.-year-old patient lying in the bed with no acute distress.  EYES: Pupils equal, round, reactive to light and accommodation. No scleral icterus. Extraocular muscles intact.  HEENT: Head atraumatic, normocephalic. Oropharynx and nasopharynx clear.    NECK:  Supple, no jugular venous distention. No thyroid enlargement, no tenderness.  LUNGS: Normal breath sounds bilaterally, no wheezing, rales, rhonchi. No use of accessory muscles of respiration.  CARDIOVASCULAR: S1, S2 normal. No murmurs, rubs, or gallops.  ABDOMEN: Soft,  nontender, nondistended. Bowel sounds present. No organomegaly or mass.  EXTREMITIES: No cyanosis, clubbing or edema b/l.    NEUROLOGIC: Cranial nerves II through XII are intact. No focal Motor or sensory deficits b/l.  Overall weak, mild dysarthria PSYCHIATRIC:  patient is alert and oriented x 3.  SKIN: No obvious rash, lesion, or ulcer.   LABORATORY PANEL:  CBC Recent Labs  Lab 09/22/18 0826  WBC 4.8  HGB 7.6*  HCT 22.7*  PLT 68*    Chemistries  Recent Labs  Lab 09/20/18 0533  09/22/18 0826 09/23/18 0608  NA 140   < > 138  --   K 3.3*   < > 3.1* 3.4*  CL 108   < > 109  --   CO2 19*   < > 22  --   GLUCOSE 105*   < > 113*  --   BUN 40*   < > 29*  --   CREATININE 1.32*   < > 0.87  --   CALCIUM 7.9*   < > 7.4*  --   MG 1.9  --   --   --   AST  --    < > 297*  --   ALT  --    < > 127*  --   ALKPHOS  --    < > 113  --   BILITOT  --    < > 0.8  --    < > = values in this interval not displayed.   Cardiac Enzymes Recent Labs  Lab 09/20/18 0533  TROPONINI 0.15*   RADIOLOGY:  Ct Head Wo Contrast  Result Date: 09/21/2018 CLINICAL DATA:  Follow-up examination for subdural hemorrhage. EXAM: CT HEAD WITHOUT CONTRAST TECHNIQUE: Contiguous axial  images were obtained from the base of the skull through the vertex without intravenous contrast. COMPARISON:  Previous brain MRI from earlier the same day. FINDINGS: Brain: Again seen is a left holo hemispheric subdural hematoma. This measures up to 8 mm in maximal dimension at the level of the left frontal lobe. Extension along the falx, with left parafalcine component measuring up to 6 mm. Mild mass effect with associated trace 2 mm left-to-right shift. Overall, appearance is stable from previous MRI. No other definite acute intracranial hemorrhage. No acute large vessel territory infarct. Previously identified punctate left frontal cortical infarct not seen by CT. Underlying atrophy with chronic microvascular ischemic disease noted. No  mass lesion. No hydrocephalus. Vascular: No hyperdense vessel. Scattered vascular calcifications noted within the carotid siphons. Skull: Evolving left frontal scalp contusion/hematoma. Calvarium intact. Sinuses/Orbits: Globes and orbital soft tissues within normal limits. Paranasal sinuses and mastoid air cells are clear. Other: None. IMPRESSION: 1. Stable size and appearance of left holo hemispheric subdural hematoma measuring up to 8 mm in maximal dimension. Associated trace 2 mm left-to-right shift. 2. Evolving left frontal scalp contusion/hematoma. 3. No other new acute intracranial process. Electronically Signed   By: Jeannine Boga M.D.   On: 09/21/2018 18:45   Mr Brain Wo Contrast  Result Date: 09/21/2018 CLINICAL DATA:  Initial evaluation for acute speech difficulty, recent falls. EXAM: MRI HEAD WITHOUT CONTRAST TECHNIQUE: Multiplanar, multiecho pulse sequences of the brain and surrounding structures were obtained without intravenous contrast. COMPARISON:  Prior CT from 09/19/2018. FINDINGS: Brain: Generalized age-related cerebral atrophy with mild chronic small vessel ischemic disease. Remote lacunar infarct present at the right external capsule/lentiform nucleus. Small amount of associated chronic hemosiderin staining. There has been interval development of an acute subdural hematoma overlying the left cerebral convexity. This measures up to 8 mm in maximal diameter. Extension along the falx. Associated trace 2 mm left-to-right shift. No hydrocephalus or ventricular trapping. Basilar cisterns remain patent. 4 mm focus of diffusion abnormality involving the left frontal cortex at the level of the operculum suspicious for a small acute ischemic infarct (series 7, image 22). No associated hemorrhage. No other evidence for acute or subacute ischemia. Gray-white matter differentiation otherwise maintained. Pituitary gland prominent with abnormal convex border superiorly (series 9, image 12). No other  mass lesion. Midline structures intact. Vascular: Major intracranial vascular flow voids are maintained. Skull and upper cervical spine: Craniocervical junction within normal limits. Multilevel degenerative spondylolysis noted within the upper cervical spine without high-grade stenosis. Bone marrow signal intensity within normal limits. Evolving soft tissue contusion/hematoma at the left frontal scalp. Sinuses/Orbits: Globes and orbital soft tissues within normal limits. Paranasal sinuses are clear. Small right mastoid effusion noted, of doubtful significance. Inner ear structures grossly normal. Other: None. IMPRESSION: 1. Delayed intracranial hemorrhage, with interval development of acute left subdural hematoma, measuring up to 8 mm in maximal thickness. Associated mild mass effect with trace 2 mm left-to-right shift. 2. 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 3. Underlying age-related cerebral atrophy with mild chronic microvascular ischemic disease. 4. Abnormal in prominent appearance of the pituitary gland, raising the possibility for an underlying pituitary lesion. Correlation with laboratory values recommended. Additionally, further assessment with dedicated pituitary protocol MRI suggested for further evaluation. This could be performed on a nonemergent outpatient basis. Critical Value/emergent results were called by telephone at the time of interpretation on 09/21/2018 at 2:30 pm to Dr. Fritzi Mandes , who verbally acknowledged these results. Electronically Signed  By: Jeannine Boga M.D.   On: 09/21/2018 14:37   ASSESSMENT AND PLAN:   85 year old male with history of hypertension who has had decreased appetite for several weeks who presents after multiple falls today and syncopal episode.  1. Syncope with hypotension secondary dehydration, hypokalemia -remains in sinus rhythm  -troponin 0.19-- 0.17--- 0.15 -EKG shows ST inversion and  lateral leads. No anticoagulation due to low platelets. - echocardiogram done results pending--Have asked dr Clayborn Bigness to read echo -holding of aspirin secondary to melena and Subdural hematoma Physical therapy consultation --SNF pt and dter sheri agreeable -IV fluids started--changed to bicarb gtt--d/ced now. Bicarb improved CT head on admission was unremarkable -MRI brain showed left frontal cortex subdural hematoma with s/o infarct  2.  Acute kidney injury in the setting of dehydration:  -Hold nephrotoxic medications including blood pressure medications  - received IV fluids.  -Baseline creatinine 0.8 in October 2018 -comes in with creatinine of 1.82--- IV fluids-- 1.32--0.9 d/c IVF -good UOP  3.  Left Subdural Hematoma  S/p fall at home (delayed effects seen on MRI brain) with leftfrontal cortex CVA/infarct -hold ASA -seen by Dr Cari Caraway -Repeat CT head 09/20/18-- stable hematoma -MBS today per speech  4.  Elevated liver function,LDH low platelet count and anemia - ultrasound abdomen-- no hepatosplenomegaly -seen by Dr Tasia Catchings - PS- no fragmented RBC, mild lymphopenia--less likely TTP, some large lymphoid cells ?plasmacytoid appearance -increased IPF--indicates peripheral destruction of platelets  5.  Fever: etio unclear ?viral -BC 1/4 GPR --bacillus species -d/c vanc, cefepime Chest x-ray and UA are unremarkable. -UC neg -COVID-19 on admission neg -tmax 103--now afebrile -ID consult appreciated USG abdomen--no acute abnormality -cont doxy for possible tick bite illness  6.  Hypertension -resumed Lisinopril  7. HYpokalemia: REpleted  - MG within normal limits  8. History of prostate cancer -pelvic x-rays  Shows diffuse osteopenia  9.Acute on Chronic anemia with melena and left subdural hematoma -GI saw pt--colonoscopy as out pt (pt preference) -EGD 11/2017--Barretts esophagus -hgb 9.0--10.0--8.7--7.6--1 unti BT today  Spoke with daughter [name] updated 6/24 -  planning to rehab early next week  CODE STATUS: full  DVT Prophylaxis: SCD  TOTAL TIME TAKING CARE OF THIS PATIENT: *30* minutes.  >50% time spent on counselling and coordination of care  POSSIBLE D/C IN 2-3 DAYS, DEPENDING ON CLINICAL CONDITION.  Note: This dictation was prepared with Dragon dictation along with smaller phrase technology. Any transcriptional errors that result from this process are unintentional.  Fritzi Mandes M.D on 09/23/2018 at 11:32 AM  Between 7am to 6pm - Pager - [phone]  After 6pm go to [email or web address] - password [email or web address] [facility]  Office  [phone]  CC: Primary care physician; [name], MDPatient ID: Mindi Junker, male   DOB: November 04, 1933, 85 y.o.   MRN: [id number]

## 2018-09-23 NOTE — TOC Progression Note (Signed)
Transition of Care [facility]) - Progression Note    Patient Details  Name: [name] MRN: [id number] Date of Birth: [birth year]  Transition of [facility]) CM/SW Contact  Elza Rafter, RN Phone Number: 09/23/2018, 2:46 PM  Clinical Narrative:   PT is now recommending SNF.  Spoke with patient and he wants to "fix this swallowing problem".  He is alert and oriented x4.  Having a capsule swallow study today followed by a blood transfusion.  Called and spoke with Sheri-Daughter [phone].  She states she thinks he really needs STR before going home.  This RNCM explained process; started STR work up and sent out for bed offers.  Told Sheri we would update her on bed offers.      Expected Discharge Plan: Skilled Nursing Facility Barriers to Discharge: Continued Medical Work up  Expected Discharge Plan and Services Expected Discharge Plan: [place]   Discharge Planning Services: CM Consult                                           Social Determinants of Health (SDOH) Interventions    Readmission Risk Interventions Readmission Risk Prevention Plan 09/21/2018  Post Dischage Appt Complete  Medication Screening Complete  Transportation Screening Complete  Some recent data might be hidden

## 2018-09-24 ENCOUNTER — Telehealth: Payer: Self-pay | Admitting: Oncology

## 2018-09-24 LAB — BASIC METABOLIC PANEL
Anion gap: 10 (ref 5–15)
BUN: 21 mg/dL (ref 8–23)
CO2: 20 mmol/L — ABNORMAL LOW (ref 22–32)
Calcium: 7.8 mg/dL — ABNORMAL LOW (ref 8.9–10.3)
Chloride: 116 mmol/L — ABNORMAL HIGH (ref 98–111)
Creatinine, Ser: 0.9 mg/dL (ref 0.61–1.24)
GFR calc Af Amer: >60 mL/min (ref >60)
GFR calc non Af Amer: >60 mL/min (ref >60)
Glucose, Bld: 103 mg/dL — ABNORMAL HIGH (ref 70–99)
Potassium: 4.3 mmol/L (ref 3.5–5.1)
Sodium: 146 mmol/L — ABNORMAL HIGH (ref 135–145)

## 2018-09-24 LAB — TYPE AND SCREEN
ABO/RH(D): A POS
Antibody Screen: NEGATIVE
Unit division: 0

## 2018-09-24 LAB — CBC
HCT: 28.7 % — ABNORMAL LOW (ref 39.0–52.0)
Hemoglobin: 9.6 g/dL — ABNORMAL LOW (ref 13.0–17.0)
MCH: 29.5 pg (ref 26.0–34.0)
MCHC: 33.4 g/dL (ref 30.0–36.0)
MCV: 88.3 fL (ref 80.0–100.0)
Platelets: 169 10*3/uL (ref 150–400)
RBC: 3.25 MIL/uL — ABNORMAL LOW (ref 4.22–5.81)
RDW: 14.6 % (ref 11.5–15.5)
WBC: 11.9 10*3/uL — ABNORMAL HIGH (ref 4.0–10.5)
nRBC: 0 % (ref 0.0–0.2)

## 2018-09-24 LAB — CULTURE, BLOOD (ROUTINE X 2)
Culture: NO GROWTH
Special Requests: ADEQUATE

## 2018-09-24 LAB — BPAM RBC
Blood Product Expiration Date: 202007102359
ISSUE DATE / TIME: 202006261526
Unit Type and Rh: 6200

## 2018-09-24 LAB — HEPATIC FUNCTION PANEL
ALT: 158 U/L — ABNORMAL HIGH (ref 0–44)
AST: 251 U/L — ABNORMAL HIGH (ref 15–41)
Albumin: 2.5 g/dL — ABNORMAL LOW (ref 3.5–5.0)
Alkaline Phosphatase: 133 U/L — ABNORMAL HIGH (ref 38–126)
Bilirubin, Direct: 0.4 mg/dL — ABNORMAL HIGH (ref 0.0–0.2)
Indirect Bilirubin: 0.8 mg/dL (ref 0.3–0.9)
Total Bilirubin: 1.2 mg/dL (ref 0.3–1.2)
Total Protein: 5 g/dL — ABNORMAL LOW (ref 6.5–8.1)

## 2018-09-24 LAB — LIPASE, BLOOD: Lipase: 77 U/L — ABNORMAL HIGH (ref 11–51)

## 2018-09-24 LAB — PHOSPHORUS: Phosphorus: 2.4 mg/dL — ABNORMAL LOW (ref 2.5–4.6)

## 2018-09-24 MED ORDER — MORPHINE SULFATE (PF) 2 MG/ML IV SOLN
1.0000 mg | INTRAVENOUS | Status: DC | PRN
  Administered 2018-09-24 – 2018-10-09 (×6): 1 mg via INTRAVENOUS
  Filled 2018-09-24 (×7): qty 1

## 2018-09-24 NOTE — Telephone Encounter (Signed)
error 

## 2018-09-24 NOTE — Progress Notes (Signed)
Pt c/o abdominal pain similar to earlier today. Bladder scan showed >934mL. Notified MD Jannifer Franklin and order placed for I&O cath PRN for bladder scan >573mL. I&O cath drained 539mL and residual showed ~210mL remaining. Pt stated relief of abdominal discomfort post cath. Nursing staff will continue to monitor for any changes in patient status. Earleen Reaper, RN

## 2018-09-24 NOTE — Progress Notes (Signed)
[facility] at [place] NAME: Quadre Bristol    MR#:  [id number]  DATE OF BIRTH:  1934/01/19  SUBJECTIVE:  Patient's having abdominal pain today   REVIEW OF SYSTEMS:   Review of Systems  Constitutional: Negative for chills, fever and weight loss.  HENT: Negative for ear discharge, ear pain and nosebleeds.   Eyes: Negative for blurred vision, pain and discharge.  Respiratory: Negative for sputum production, shortness of breath, wheezing and stridor.   Cardiovascular: Negative for chest pain, palpitations, orthopnea and PND.  Gastrointestinal: Positive for abdominal pain and melena. Negative for diarrhea, nausea and vomiting.  Genitourinary: Negative for frequency and urgency.  Musculoskeletal: Positive for falls and joint pain. Negative for back pain.  Neurological: Positive for weakness. Negative for sensory change, speech change and focal weakness.  Psychiatric/Behavioral: Negative for depression and hallucinations. The patient is not nervous/anxious.      DRUG ALLERGIES:  No Known Allergies  VITALS:  Blood pressure 136/85, pulse 87, temperature 98 F (36.7 C), temperature source Oral, resp. rate 19, height 6' (1.829 m), weight 60.5 kg, SpO2 97 %.  PHYSICAL EXAMINATION:   Physical Exam  GENERAL:  85 y.o.-year-old patient lying in the bed with no acute distress.  EYES: Pupils equal, round, reactive to light and accommodation. No scleral icterus. Extraocular muscles intact.  HEENT: Head atraumatic, normocephalic. Oropharynx and nasopharynx clear.    NECK:  Supple, no jugular venous distention. No thyroid enlargement, no tenderness.  LUNGS: Normal breath sounds bilaterally, no wheezing, rales, rhonchi. No use of accessory muscles of respiration.  CARDIOVASCULAR: S1, S2 normal. No murmurs, rubs, or gallops.  ABDOMEN: Soft, nontender, nondistended. Bowel sounds present. No organomegaly or mass.  EXTREMITIES: No cyanosis,  clubbing or edema b/l.    NEUROLOGIC: Cranial nerves II through XII are intact. No focal Motor or sensory deficits b/l.  Overall weak, mild dysarthria PSYCHIATRIC:  patient is alert and oriented x 3.  SKIN: No obvious rash, lesion, or ulcer.   LABORATORY PANEL:  CBC Recent Labs  Lab 09/24/18 0430  WBC 11.9*  HGB 9.6*  HCT 28.7*  PLT 169    Chemistries  Recent Labs  Lab 09/20/18 0533  09/24/18 0430 09/24/18 1231  NA 140   < > 146*  --   K 3.3*   < > 4.3  --   CL 108   < > 116*  --   CO2 19*   < > 20*  --   GLUCOSE 105*   < > 103*  --   BUN 40*   < > 21  --   CREATININE 1.32*   < > 0.90  --   CALCIUM 7.9*   < > 7.8*  --   MG 1.9  --   --   --   AST  --    < >  --  251*  ALT  --    < >  --  158*  ALKPHOS  --    < >  --  133*  BILITOT  --    < >  --  1.2   < > = values in this interval not displayed.   Cardiac Enzymes Recent Labs  Lab 09/20/18 0533  TROPONINI 0.15*   RADIOLOGY:  No results found. ASSESSMENT AND PLAN:   85 year old male with history of hypertension who has had decreased appetite for several weeks who presents after multiple falls today and syncopal episode.  1. Syncope with  hypotension secondary dehydration, hypokalemia -holding of aspirin secondary to melena and Subdural hematoma Physical therapy consultation --SNF pt and dter sheri agreeable CT head on admission was unremarkable -MRI brain showed left frontal cortex subdural hematoma with s/o infarct -Patient seen by neurosurgery recommended to monitor patient  2.  Acute kidney injury in the setting of dehydration:  -Hold nephrotoxic medications including blood pressure medications  - received IV fluids.  -Baseline creatinine 0.8 in October 2018 -Renal function stable -Had urine retention  3.  Left Subdural Hematoma  S/p fall at home (delayed effects seen on MRI brain) with leftfrontal cortex CVA/infarct -hold ASA -seen by Dr Cari Caraway -Repeat CT head 09/20/18-- stable hematoma -MBS  shows patient with severe dysphasia  4.  Elevated liver function,LDH low platelet count and anemia - ultrasound abdomen-- no hepatosplenomegaly -seen by Dr Tasia Catchings - PS- no fragmented RBC, mild lymphopenia--less likely TTP, some large lymphoid cells ?plasmacytoid appearance Platelets improved as well as hemoglobin is stable   5.  Fever: etio unclear ?viral -BC 1/4 GPR --bacillus species -d/c vanc, cefepime Chest x-ray and UA are unremarkable. -UC neg -COVID-19 on admission neg -tmax 103--now afebrile -ID consult appreciated USG abdomen--no acute abnormality -cont doxy for possible tick bite illness  6.  Hypertension Continue  7. HYpokalemia: Replace  8. History of prostate cancer -pelvic x-rays  Shows diffuse osteopenia  9.Acute on Chronic anemia with melena and left subdural hematoma -GI saw pt--colonoscopy as out pt (pt preference) -EGD 11/2017--Barretts esophagus -Hemoglobin stable   CODE STATUS: full  DVT Prophylaxis: SCD  TOTAL TIME TAKING CARE OF THIS PATIENT: *30* minutes.  >50% time spent on counselling and coordination of care  POSSIBLE D/C IN 2-3 DAYS, DEPENDING ON CLINICAL CONDITION.  Note: This dictation was prepared with Dragon dictation along with smaller phrase technology. Any transcriptional errors that result from this process are unintentional.  Dustin Flock M.D on 09/24/2018 at 3:29 PM  Between 7am to 6pm - Pager - [phone]  After 6pm go to [email or web address] - password [email or web address] [facility]  Office  [phone]  CC: Primary care physician; [name], MDPatient ID: Mindi Junker, male   DOB: 03-Dec-1933, 85 y.o.   MRN: [id number]

## 2018-09-24 NOTE — Progress Notes (Signed)
Hematology/Oncology Progress Note [facility] [phone] [phone]  Patient Care Team: [name], MD as PCP - General (Family Medicine)   Name of the patient: [name]  [id number]  Nov 12, 1933  Date of visit: 09/24/18   INTERVAL HISTORY-  Patient lying in the bed.  Speech is slurred Reports lower abdomen pain/fullness.  Had BM this morning.    Review of systems- Review of Systems  Constitutional: Positive for fatigue.  Respiratory: Negative for chest tightness and cough.   Cardiovascular: Negative for chest pain.  Gastrointestinal: Positive for abdominal pain. Negative for nausea.  Genitourinary: Negative for difficulty urinating.   Skin: Negative for rash.  Neurological: Negative for headaches.  Psychiatric/Behavioral: Negative for confusion.    No Known Allergies  Patient Active Problem List   Diagnosis Date Noted   Anemia    Thrombocytopenia ([place])    Blood in stool    Elevated transaminase level    Syncope 09/19/2018     Past Medical History:  Diagnosis Date   Arthritis    Cancer [facility])    Prostate Cancer   Hypertension      Past Surgical History:  Procedure Laterality Date   ESOPHAGOGASTRODUODENOSCOPY (EGD) WITH PROPOFOL N/A 12/06/2017   Procedure: ESOPHAGOGASTRODUODENOSCOPY (EGD) WITH PROPOFOL;  Surgeon: Jonathon Bellows, MD;  Location: [facility] ENDOSCOPY;  Service: Gastroenterology;  Laterality: N/A;   PROSTATE CRYOABLATION      Social History   Socioeconomic History   Marital status: Widowed    Spouse name: Not on file   Number of children: Not on file   Years of education: Not on file   Highest education level: Not on file  Occupational History   Not on file  Social Needs   Financial resource strain: Not on file   Food insecurity    Worry: Not on file    Inability: Not on file   Transportation needs    Medical: Not on file    Non-medical: Not on file  Tobacco Use    Smoking status: Former Smoker    Types: Pipe    Quit date: 03/30/2002    Years since quitting: 16.4   Smokeless tobacco: Never Used  Substance and Sexual Activity   Alcohol use: Yes   Drug use: Never   Sexual activity: Not on file  Lifestyle   Physical activity    Days per week: Not on file    Minutes per session: Not on file   Stress: Not on file  Relationships   Social connections    Talks on phone: Not on file    Gets together: Not on file    Attends religious service: Not on file    Active member of club or organization: Not on file    Attends meetings of clubs or organizations: Not on file    Relationship status: Not on file   Intimate partner violence    Fear of current or ex partner: Not on file    Emotionally abused: Not on file    Physically abused: Not on file    Forced sexual activity: Not on file  Other Topics Concern   Not on file  Social History Narrative   Not on file     History reviewed. No pertinent family history.   Current Facility-Administered Medications:    0.9 % NaCl with KCl 40 mEq / L  infusion, , Intravenous, Continuous, Fritzi Mandes, MD, Last Rate: 75 mL/hr at 09/24/18 0657, 75 mL/hr at 09/24/18 [phone]  acetaminophen (TYLENOL) tablet 650 mg, 650 mg, Oral, Q6H PRN, 650 mg at 09/20/18 1944 **OR** acetaminophen (TYLENOL) suppository 650 mg, 650 mg, Rectal, Q6H PRN, Mody, Sital, MD   doxycycline (VIBRAMYCIN) 100 mg in sodium chloride 0.9 % 250 mL IVPB, 100 mg, Intravenous, Q12H, Fritzi Mandes, MD, Last Rate: 125 mL/hr at 09/24/18 1037, 100 mg at 09/24/18 1037   feeding supplement (ENSURE ENLIVE) (ENSURE ENLIVE) liquid 237 mL, 237 mL, Oral, TID BM, Fritzi Mandes, MD, Stopped at 09/22/18 2010   HYDROcodone-acetaminophen (NORCO/VICODIN) 5-325 MG per tablet 1 tablet, 1 tablet, Oral, Q6H PRN, Fritzi Mandes, MD   lisinopril (ZESTRIL) tablet 20 mg, 20 mg, Oral, Daily, Fritzi Mandes, MD, 20 mg at 09/22/18 1033   morphine 2 MG/ML injection 1 mg, 1 mg,  Intravenous, Q4H PRN, Dustin Flock, MD   multivitamin with minerals tablet 1 tablet, 1 tablet, Oral, Daily, Fritzi Mandes, MD, 1 tablet at 09/22/18 1032   ondansetron (ZOFRAN) tablet 4 mg, 4 mg, Oral, Q6H PRN **OR** ondansetron (ZOFRAN) injection 4 mg, 4 mg, Intravenous, Q6H PRN, Mody, Sital, MD   pantoprazole (PROTONIX) injection 40 mg, 40 mg, Intravenous, Q12H, Fritzi Mandes, MD, 40 mg at 09/24/18 1029   polyethylene glycol (MIRALAX / GLYCOLAX) packet 17 g, 17 g, Oral, Daily PRN, Mody, Sital, MD   vitamin C (ASCORBIC ACID) tablet 250 mg, 250 mg, Oral, BID, Fritzi Mandes, MD, Stopped at 09/22/18 2011   Physical exam:  Vitals:   09/23/18 1906 09/23/18 1926 09/24/18 0323 09/24/18 0725  BP: (!) 161/83 (!) 165/98 (!) 162/97 (!) 173/94  Pulse: 81 81 (!) 105 97  Resp: 16 16 20 19   Temp: 98.7 F (37.1 C) 98.4 F (36.9 C) 97.8 F (36.6 C) 99.8 F (37.7 C)  TempSrc: Oral Oral  Oral  SpO2: 99% 97% 97% 97%  Weight:      Height:       Physical Exam  Constitutional: No distress.  Frail appearance, lying the bed  HENT:  Mouth/Throat: No oropharyngeal exudate.  Left forehead bruising  Eyes: Pupils are equal, round, and reactive to light. EOM are normal. No scleral icterus.  Neck: Normal range of motion. Neck supple.  Cardiovascular: Normal rate and regular rhythm.  No murmur heard. Pulmonary/Chest: Effort normal. No respiratory distress.  Abdominal: Soft.  Full bladder, lower abd fullness.   Musculoskeletal: Normal range of motion.        General: No edema.     Comments: Slurred speech.   Neurological: He is alert.  Skin: Skin is warm and dry. He is not diaphoretic.  Bruises  Psychiatric: Affect normal.       CMP Latest Ref Rng & Units 09/24/2018  Glucose 70 - 99 mg/dL 103(H)  BUN 8 - 23 mg/dL 21  Creatinine 0.61 - 1.24 mg/dL 0.90  Sodium 135 - 145 mmol/L 146(H)  Potassium 3.5 - 5.1 mmol/L 4.3  Chloride 98 - 111 mmol/L 116(H)  CO2 22 - 32 mmol/L 20(L)  Calcium 8.9 - 10.3  mg/dL 7.8(L)  Total Protein 6.5 - 8.1 g/dL -  Total Bilirubin 0.3 - 1.2 mg/dL -  Alkaline Phos 38 - 126 U/L -  AST 15 - 41 U/L -  ALT 0 - 44 U/L -   CBC Latest Ref Rng & Units 09/24/2018  WBC 4.0 - 10.5 K/uL 11.9(H)  Hemoglobin 13.0 - 17.0 g/dL 9.6(L)  Hematocrit 39.0 - 52.0 % 28.7(L)  Platelets 150 - 400 K/uL 169    RADIOGRAPHIC STUDIES: I have personally reviewed the  radiological images as listed and agreed with the findings in the report.  Dg Chest 1 View  Result Date: 09/19/2018 CLINICAL DATA:  85 year old male with weakness and fever. COVID-19 status pending. EXAM: CHEST  1 VIEW COMPARISON:  CT Abdomen and Pelvis 01/20/2017. FINDINGS: Portable AP upright view at 1506 hours. Calcified aortic atherosclerosis. Other mediastinal contours are within normal limits. Visualized tracheal air column is within normal limits. Lung volumes are at the upper limits of normal. No pneumothorax, pleural effusion or pulmonary edema. No confluent pulmonary opacity. External button artifact suspected over the anterior 2nd rib on image #1. Chronic appearing posterior right rib fractures. Negative visible bowel gas pattern. IMPRESSION: No acute cardiopulmonary abnormality. Electronically Signed   By: Genevie Ann M.D.   On: 09/19/2018 15:39   Dg Pelvis 1-2 Views  Result Date: 09/20/2018 CLINICAL DATA:  Multiple falls.  Bilateral hip pain and weakness. EXAM: PELVIS - 1-2 VIEW COMPARISON:  CT 01/20/2017. FINDINGS: Diffuse osteopenia. Degenerative change both hips. No evidence of fracture or dislocation. A stable tiny sclerotic density noted in the left pubis consistent with a bone island. Pelvic calcifications consistent phleboliths. Peripheral vascular calcification. IMPRESSION: 1. Diffuse osteopenia. Degenerative change both hips. No acute bony abnormality. 2.  Peripheral vascular disease. Electronically Signed   By: Marcello Moores  Register   On: 09/20/2018 09:56   Ct Head Wo Contrast  Result Date: 09/21/2018 CLINICAL  DATA:  Follow-up examination for subdural hemorrhage. EXAM: CT HEAD WITHOUT CONTRAST TECHNIQUE: Contiguous axial images were obtained from the base of the skull through the vertex without intravenous contrast. COMPARISON:  Previous brain MRI from earlier the same day. FINDINGS: Brain: Again seen is a left holo hemispheric subdural hematoma. This measures up to 8 mm in maximal dimension at the level of the left frontal lobe. Extension along the falx, with left parafalcine component measuring up to 6 mm. Mild mass effect with associated trace 2 mm left-to-right shift. Overall, appearance is stable from previous MRI. No other definite acute intracranial hemorrhage. No acute large vessel territory infarct. Previously identified punctate left frontal cortical infarct not seen by CT. Underlying atrophy with chronic microvascular ischemic disease noted. No mass lesion. No hydrocephalus. Vascular: No hyperdense vessel. Scattered vascular calcifications noted within the carotid siphons. Skull: Evolving left frontal scalp contusion/hematoma. Calvarium intact. Sinuses/Orbits: Globes and orbital soft tissues within normal limits. Paranasal sinuses and mastoid air cells are clear. Other: None. IMPRESSION: 1. Stable size and appearance of left holo hemispheric subdural hematoma measuring up to 8 mm in maximal dimension. Associated trace 2 mm left-to-right shift. 2. Evolving left frontal scalp contusion/hematoma. 3. No other new acute intracranial process. Electronically Signed   By: Jeannine Boga M.D.   On: 09/21/2018 18:45   Ct Head Wo Contrast  Result Date: 09/19/2018 CLINICAL DATA:  Pt states he fell and hit his head on the bath tub today. Pt denies LOC EXAM: CT HEAD WITHOUT CONTRAST TECHNIQUE: Contiguous axial images were obtained from the base of the skull through the vertex without intravenous contrast. COMPARISON:  None. FINDINGS: Brain: No evidence of acute infarction, hemorrhage, extra-axial collection,  ventriculomegaly, or mass effect. Generalized cerebral atrophy. Periventricular white matter low attenuation likely secondary to microangiopathy. Vascular: Cerebrovascular atherosclerotic calcifications are noted. Skull: Negative for fracture or focal lesion. Sinuses/Orbits: Visualized portions of the orbits are unremarkable. Visualized portions of the paranasal sinuses and mastoid air cells are unremarkable. Other: Large left frontal scalp hematoma. IMPRESSION: No acute intracranial pathology. Electronically Signed   By: Kathreen Devoid  On: 09/19/2018 15:42   Mr Brain Wo Contrast  Result Date: 09/21/2018 CLINICAL DATA:  Initial evaluation for acute speech difficulty, recent falls. EXAM: MRI HEAD WITHOUT CONTRAST TECHNIQUE: Multiplanar, multiecho pulse sequences of the brain and surrounding structures were obtained without intravenous contrast. COMPARISON:  Prior CT from 09/19/2018. FINDINGS: Brain: Generalized age-related cerebral atrophy with mild chronic small vessel ischemic disease. Remote lacunar infarct present at the right external capsule/lentiform nucleus. Small amount of associated chronic hemosiderin staining. There has been interval development of an acute subdural hematoma overlying the left cerebral convexity. This measures up to 8 mm in maximal diameter. Extension along the falx. Associated trace 2 mm left-to-right shift. No hydrocephalus or ventricular trapping. Basilar cisterns remain patent. 4 mm focus of diffusion abnormality involving the left frontal cortex at the level of the operculum suspicious for a small acute ischemic infarct (series 7, image 22). No associated hemorrhage. No other evidence for acute or subacute ischemia. Gray-white matter differentiation otherwise maintained. Pituitary gland prominent with abnormal convex border superiorly (series 9, image 12). No other mass lesion. Midline structures intact. Vascular: Major intracranial vascular flow voids are maintained. Skull and  upper cervical spine: Craniocervical junction within normal limits. Multilevel degenerative spondylolysis noted within the upper cervical spine without high-grade stenosis. Bone marrow signal intensity within normal limits. Evolving soft tissue contusion/hematoma at the left frontal scalp. Sinuses/Orbits: Globes and orbital soft tissues within normal limits. Paranasal sinuses are clear. Small right mastoid effusion noted, of doubtful significance. Inner ear structures grossly normal. Other: None. IMPRESSION: 1. Delayed intracranial hemorrhage, with interval development of acute left subdural hematoma, measuring up to 8 mm in maximal thickness. Associated mild mass effect with trace 2 mm left-to-right shift. 2. 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 3. Underlying age-related cerebral atrophy with mild chronic microvascular ischemic disease. 4. Abnormal in prominent appearance of the pituitary gland, raising the possibility for an underlying pituitary lesion. Correlation with laboratory values recommended. Additionally, further assessment with dedicated pituitary protocol MRI suggested for further evaluation. This could be performed on a nonemergent outpatient basis. Critical Value/emergent results were called by telephone at the time of interpretation on 09/21/2018 at 2:30 pm to Dr. Fritzi Mandes , who verbally acknowledged these results. Electronically Signed   By: Jeannine Boga M.D.   On: 09/21/2018 14:37   US Abdomen Complete  Result Date: 09/20/2018 CLINICAL DATA:  Elevated liver function tests. EXAM: ABDOMEN ULTRASOUND COMPLETE COMPARISON:  CT abdomen and pelvis 01/20/2017. FINDINGS: Gallbladder: No gallstones or wall thickening visualized. No sonographic Murphy sign noted by sonographer. Common bile duct: Diameter: 0.2 cm Liver: No focal lesion identified. Within normal limits in parenchymal echogenicity. Portal vein is patent on color Doppler  imaging with normal direction of blood flow towards the liver. IVC: No abnormality visualized. Pancreas: Visualized portion unremarkable. Spleen: Size and appearance within normal limits. A few small calcifications in the spleen consistent with old granulomatous disease are seen as on prior CT. Right Kidney: Length: 12.5 cm. Echogenicity within normal limits. No solid mass or hydronephrosis visualized. 7.4 cm in diameter cyst is identified as seen on prior CT. Calcification in the wall of this cyst is unchanged. Left Kidney: Length: 12.5 cm. Echogenicity within normal limits. No solid mass or hydronephrosis visualized. Two simple cysts measuring 2.0 and 1.8 cm in diameter are unchanged. A 1.5 cm stone in the mid to lower pole is identified and was present on the prior CT. Abdominal aorta: No aneurysm visualized.  Atherosclerosis noted. Other findings: None. IMPRESSION: No acute abnormality.  Normal-appearing liver and gallbladder. Nonobstructing stone lower pole left kidney. Bilateral renal cysts. Atherosclerosis. Electronically Signed   By: Inge Rise M.D.   On: 09/20/2018 10:24    Assessment and plan-  Patient is a 85 y.o. male history of hypertension, history of prostate cancer, presented for evaluation of multiple falls and generalized weakness.  Found to be febrile in the emergency room.  #Thrombocytopenia resolved. Likely due to consumption.  # Anemia, s/p 1 unit PRBC yesterday, Hb increased to 9.6.  Anemia needs to have outpatient follow up  MM panel pending.   # Tranaminitis, ?underlying underlying liver disease/alcohol use.  # History of prostate cancer, outpatient follow up.  # High ferritin, likely due to acute illness. Reactive.  Less likely [place], given resolved febrile illness, no organomegaly, cytopenia due to other eitology- GI bleeds.   #Intracranial hematoma/bleeding, secondary to injury.   neurosurgery on board. Continue neuro check # lower abd fullness, ? full bladder. Check  post void residual volume.discussed with RN.   Thank you for allowing me to participate in the care of this patient.    Earlie Server, MD, PhD 09/24/2018

## 2018-09-24 NOTE — Progress Notes (Signed)
Update given to [facility], daughter, over the phone. Requested for MD to call her. Dr. Posey Pronto notified.

## 2018-09-24 NOTE — Plan of Care (Signed)
  Problem: Education: Goal: Knowledge of General Education information will improve Description: Including pain rating scale, medication(s)/side effects and non-pharmacologic comfort measures Outcome: Progressing   Problem: Nutrition: Goal: Adequate nutrition will be maintained Outcome: Not Progressing Note: Patient still NPO.    Problem: Elimination: Goal: Will not experience complications related to bowel motility Outcome: Not Progressing Note: Patient had dark stool today.

## 2018-09-24 NOTE — Progress Notes (Signed)
Patient was complaining of abdominal pain. MD notified, verbal orders to do bladder scan and PRN pain med order put in. Pain medication administered. Bladders can revealed 892 ml. MD notified, verbal orders to do in and out cath and bladder scan every 8 hours.   Update 1430: In and out cath completed. Total of 800 ml urine drained. After completion, patient stated he felt much better and did not have abdominal pain anymore. Will continue to monitor patient.

## 2018-09-24 NOTE — Consult Note (Signed)
PHARMACY CONSULT NOTE - FOLLOW UP  Pharmacy Consult for Electrolyte Monitoring and Replacement   Recent Labs: Potassium (mmol/L)  Date Value  09/24/2018 4.3   Magnesium (mg/dL)  Date Value  09/20/2018 1.9   Calcium (mg/dL)  Date Value  09/24/2018 7.8 (L)   Albumin (g/dL)  Date Value  09/22/2018 2.1 (L)   Phosphorus (mg/dL)  Date Value  09/24/2018 2.4 (L)   Sodium (mmol/L)  Date Value  09/24/2018 146 (H)   Corrected Ca: 9.32  Scr 1.81>0.91  Assessment: Patient admitted with  Hypokalemia. Pharmacy consulted for electrolyte monitoring and replacement. Patient unable to take oral replacement at this time.   Goal of Therapy:  Electrolytes WNL  Plan:  Phos 2.4 - However Na > 145 mmol/L and K > 4 mmol/L.   No replacement warranted at this time.   Will F/U with AM labs and continue to replace electrolytes as needed.   Pernell Dupre, PharmD, BCPS Clinical Pharmacist 09/24/2018 5:34 AM

## 2018-09-25 DIAGNOSIS — R740 Nonspecific elevation of levels of transaminase and lactic acid dehydrogenase [LDH]: Secondary | ICD-10-CM

## 2018-09-25 LAB — BASIC METABOLIC PANEL
Anion gap: 10 (ref 5–15)
BUN: 22 mg/dL (ref 8–23)
CO2: 22 mmol/L (ref 22–32)
Calcium: 8.2 mg/dL — ABNORMAL LOW (ref 8.9–10.3)
Chloride: 121 mmol/L — ABNORMAL HIGH (ref 98–111)
Creatinine, Ser: 1.28 mg/dL — ABNORMAL HIGH (ref 0.61–1.24)
GFR calc Af Amer: >60 mL/min (ref >60)
GFR calc non Af Amer: 52 mL/min — ABNORMAL LOW (ref >60)
Glucose, Bld: 94 mg/dL (ref 70–99)
Potassium: 4.3 mmol/L (ref 3.5–5.1)
Sodium: 153 mmol/L — ABNORMAL HIGH (ref 135–145)

## 2018-09-25 LAB — CBC
HCT: 28.6 % — ABNORMAL LOW (ref 39.0–52.0)
Hemoglobin: 9.4 g/dL — ABNORMAL LOW (ref 13.0–17.0)
MCH: 29.5 pg (ref 26.0–34.0)
MCHC: 32.9 g/dL (ref 30.0–36.0)
MCV: 89.7 fL (ref 80.0–100.0)
Platelets: 247 10*3/uL (ref 150–400)
RBC: 3.19 MIL/uL — ABNORMAL LOW (ref 4.22–5.81)
RDW: 15 % (ref 11.5–15.5)
WBC: 11.7 10*3/uL — ABNORMAL HIGH (ref 4.0–10.5)
nRBC: 0 % (ref 0.0–0.2)

## 2018-09-25 LAB — PHOSPHORUS: Phosphorus: 2.8 mg/dL (ref 2.5–4.6)

## 2018-09-25 LAB — MAGNESIUM: Magnesium: 2.1 mg/dL (ref 1.7–2.4)

## 2018-09-25 LAB — GAMMA GT: GGT: 68 U/L — ABNORMAL HIGH (ref 7–50)

## 2018-09-25 LAB — TSH: TSH: 0.66 u[IU]/mL (ref 0.350–4.500)

## 2018-09-25 LAB — CK: Total CK: 283 U/L (ref 49–397)

## 2018-09-25 MED ORDER — DEXTROSE 5 % IV SOLN
INTRAVENOUS | Status: DC
  Administered 2018-09-25 (×2): via INTRAVENOUS

## 2018-09-25 NOTE — Progress Notes (Signed)
Update given to daughter [name] over the phone.

## 2018-09-25 NOTE — Consult Note (Signed)
Jonathon Bellows , MD [address], [place], [place], [place], [address] [address], [place], [place], [place], [address] Phone: [phone]  Fax: [phone]  Consultation  Referring Provider:  Dr Posey Pronto  Primary Care Physician:  [name], MD Primary Gastroenterologist:  Dr. Vicente Males         Reason for Consultation:     Abnormal LFT's  Date of Admission:  09/19/2018 Date of Consultation:  09/25/2018         HPI:   [name] is a 85 y.o. male was admitted after having a fall and passing out.  He has been having dizziness for the last 3 days.  When EMS arrived was found to be hypotensive and given IV saline.  CT scan of the head on 09/21/2018 demonstrated a stable size appearance of left subdural hematoma.  On admission had a hemoglobin of 8.7 g with an MCV of 87 and a platelet count of 52.  Albumin 2.1 with an AST of 297 and ALT of 127 and a total bilirubin of 0.8.  INR 1.3.  Acute hepatitis panel is negative.  CK elevated on admission at 548.  No abdominal imaging.  She has been evaluated by hematology for the low platelet count increased LDH and anemia.  History of drinking alcohol on a daily basis. He was seen by Dr. Allen Norris on 09/20/2018 for anemia.  It was decided that further evaluation will be done as an outpatient.  Today have been asked to see him for abnormal LFT's: patient a bit confused, unable to provide much history . Being treated for an infection , sub dural hematoma, recent fall.    Past Medical History:  Diagnosis Date  . Arthritis   . Cancer [facility])    Prostate Cancer  . Hypertension     Past Surgical History:  Procedure Laterality Date  . ESOPHAGOGASTRODUODENOSCOPY (EGD) WITH PROPOFOL N/A 12/06/2017   Procedure: ESOPHAGOGASTRODUODENOSCOPY (EGD) WITH PROPOFOL;  Surgeon: Jonathon Bellows, MD;  Location: [facility] ENDOSCOPY;  Service: Gastroenterology;  Laterality: N/A;  . PROSTATE CRYOABLATION      Prior to Admission medications   Medication Sig Start Date End Date  Taking? Authorizing Provider  aspirin 325 MG EC tablet Take 325 mg by mouth every 4 (four) hours as needed for pain (2 tablets every 4 hours during the day for arthritis pain).   Yes [provider]  Calcium Carbonate-Vitamin D (CALCIUM 600+D) 600-400 MG-UNIT tablet Take 1 tablet by mouth 2 (two) times a day.   Yes [provider]  lisinopril (PRINIVIL,ZESTRIL) 40 MG tablet TAKE 1 TABLET BY MOUTH ONCE DAILY 12/31/14  Yes [provider]  Misc Natural Products (OSTEO BI-FLEX ADV JOINT SHIELD) TABS Take 2 tablets by mouth daily.   Yes [provider]  vitamin B-12 (CYANOCOBALAMIN) 500 MCG tablet Take 1 tablet by mouth daily.   Yes [provider]    History reviewed. No pertinent family history.   Social History   Tobacco Use  . Smoking status: Former Smoker    Types: Pipe    Quit date: 03/30/2002    Years since quitting: 16.5  . Smokeless tobacco: Never Used  Substance Use Topics  . Alcohol use: Yes  . Drug use: Never    Allergies as of 09/19/2018  . (No Known Allergies)    Review of Systems:    Cannot be obtained due to confusion    Physical Exam:  Vital signs in last 24 hours: Temp:  [97.8 F (  36.6 C)-98.6 F (37 C)] 97.8 F (36.6 C) (06/28 0755) Pulse Rate:  [87-106] 98 (06/28 0755) Resp:  [19-20] 20 (06/28 0755) BP: (136-156)/(85-107) 143/98 (06/28 0755) SpO2:  [97 %-100 %] 99 % (06/28 0755) Last BM Date: 09/24/18 General:   Pleasant, cooperative in NAD, big bruise over left side of forehead Head:  Normocephalic and atraumatic. Eyes:   No icterus.   Conjunctiva pink. PERRLA. Ears:  Normal auditory acuity. Neck:  Supple; no masses or thyroidomegaly Lungs: Respirations even and unlabored. Lungs clear to auscultation bilaterally.   No wheezes, crackles, or rhonchi.  Heart:  Regular rate and rhythm;  Without murmur, clicks, rubs or gallops Abdomen:  Soft, nondistended, nontender. Normal bowel sounds. No appreciable masses or  hepatomegaly.  No rebound or guarding.  Neurologic:  Alert and oriented x0; Skin:  Intact without significant lesions or rashes. Cervical Nodes:  No significant cervical adenopathy. Psych:  confused  LAB RESULTS: Recent Labs    09/24/18 0430 09/25/18 0545  WBC 11.9* 11.7*  HGB 9.6* 9.4*  HCT 28.7* 28.6*  PLT 169 247   BMET Recent Labs    09/23/18 0608 09/24/18 0430 09/25/18 0545  NA  --  146* 153*  K 3.4* 4.3 4.3  CL  --  116* 121*  CO2  --  20* 22  GLUCOSE  --  103* 94  BUN  --  21 22  CREATININE  --  0.90 1.28*  CALCIUM  --  7.8* 8.2*   LFT Recent Labs    09/24/18 1231  PROT 5.0*  ALBUMIN 2.5*  AST 251*  ALT 158*  ALKPHOS 133*  BILITOT 1.2  BILIDIR 0.4*  IBILI 0.8   PT/INR No results for input(s): LABPROT, INR in the last 72 hours.  STUDIES: No results found.    Impression / Plan:   [name] is a 85 y.o. y/o male whom I have been consulted for abnormal LFTs. LFTs in 2018 were normal with an albumin of 4. 2.  Since then the only LFTs that we have are on this admission which are abnormal.  He does have a history of chronic alcohol use. Right upper quadrant ultrasound on 09/20/2018 shows no abnormalities of the liver and gallbladder.  No evidence of chronic liver disease seen on right upper quadrant ultrasound.  CT scan of the abdomen in October 2018 also did not show any features of portal hypertension.  Ischemic hepatitis is a possibility as the patient was hypotensive on presentation but usually the transaminases are much higher in value.  The albumin may be low as it is a negative acute phase reactant and may simply be indicating active inflammation.  He is being treated for an infection/sepsis.  Sepsis can also cause an elevation in transaminases.  Plan 1.  Obtain autoimmune, acute hepatitis work-up: I will also obtain GGT to determine if the AST and ALT elevation are truly from the liver parenchyma or from the muscle as the CK was also elevated  on admission.   Thank you for involving me in the care of this patient.      LOS: 6 days   Jonathon Bellows, MD  09/25/2018, 10:16 AM

## 2018-09-25 NOTE — Progress Notes (Addendum)
[facility] [place] NAME: [name]    MR#:  [id number]  DATE OF BIRTH:  1934-02-20  SUBJECTIVE:  Patient continues to have trouble with voiding had to have a Foley placed   REVIEW OF SYSTEMS:   Review of Systems  Constitutional: Negative for chills, fever and weight loss.  HENT: Negative for ear discharge, ear pain and nosebleeds.   Eyes: Negative for blurred vision, pain and discharge.  Respiratory: Negative for sputum production, shortness of breath, wheezing and stridor.   Cardiovascular: Negative for chest pain, palpitations, orthopnea and PND.  Gastrointestinal: Positive for abdominal pain and melena. Negative for diarrhea, nausea and vomiting.  Genitourinary: Negative for frequency and urgency.  Musculoskeletal: Positive for falls and joint pain. Negative for back pain.  Neurological: Positive for weakness. Negative for sensory change, speech change and focal weakness.  Psychiatric/Behavioral: Negative for depression and hallucinations. The patient is not nervous/anxious.      DRUG ALLERGIES:  No Known Allergies  VITALS:  Blood pressure (!) 143/98, pulse 98, temperature 97.8 F (36.6 C), temperature source Oral, resp. rate 20, height 6' (1.829 m), weight 60.5 kg, SpO2 99 %.  PHYSICAL EXAMINATION:   Physical Exam  GENERAL:  85 y.o.-year-old patient lying in the bed with no acute distress.  EYES: Pupils equal, round, reactive to light and accommodation. No scleral icterus. Extraocular muscles intact.  HEENT: Head atraumatic, normocephalic. Oropharynx and nasopharynx clear.  Bruising on his forehead NECK:  Supple, no jugular venous distention. No thyroid enlargement, no tenderness.  LUNGS: Normal breath sounds bilaterally, no wheezing, rales, rhonchi. No use of accessory muscles of respiration.  CARDIOVASCULAR: S1, S2 normal. No murmurs, rubs, or gallops.  ABDOMEN: Soft, nontender, nondistended. Bowel sounds  present. No organomegaly or mass.  EXTREMITIES: No cyanosis, clubbing or edema b/l.    NEUROLOGIC: Cranial nerves II through XII are intact. No focal Motor or sensory deficits b/l.  Overall weak, mild dysarthria PSYCHIATRIC:  patient is alert and oriented x 3.  SKIN: No obvious rash, lesion, or ulcer.   LABORATORY PANEL:  CBC Recent Labs  Lab 09/25/18 0545  WBC 11.7*  HGB 9.4*  HCT 28.6*  PLT 247    Chemistries  Recent Labs  Lab 09/24/18 1231 09/25/18 0545  NA  --  153*  K  --  4.3  CL  --  121*  CO2  --  22  GLUCOSE  --  94  BUN  --  22  CREATININE  --  1.28*  CALCIUM  --  8.2*  MG  --  2.1  AST 251*  --   ALT 158*  --   ALKPHOS 133*  --   BILITOT 1.2  --    Cardiac Enzymes Recent Labs  Lab 09/20/18 0533  TROPONINI 0.15*   RADIOLOGY:  No results found. ASSESSMENT AND PLAN:   85 year old male with history of hypertension who has had decreased appetite for several weeks who presents after multiple falls today and syncopal episode.  1. Syncope with hypotension secondary dehydration, hypokalemia -holding of aspirin secondary to melena and Subdural hematoma Physical therapy consultation --SNF pt and dter sheri agreeable CT head on admission was unremarkable -MRI brain showed left frontal cortex subdural hematoma with s/o infarct -Patient seen by neurosurgery recommended to monitor patient  2.  Acute kidney injury in the setting of dehydration:  -Hold nephrotoxic medications including blood pressure medications  - received IV fluids.  -Baseline creatinine 0.8 in October  2018 -Renal function stable -Had urine retention  3.  Urinary retention Foley placement unfortunately he is unable to take oral meds cannot start Flomax  4.  Hypernatremia due to free water deficit we will give D5W monitor sodium   5  Left Subdural Hematoma  S/p fall at home (delayed effects seen on MRI brain) with leftfrontal cortex CVA/infarct -hold ASA -seen by Dr  Cari Caraway -Repeat CT head 09/20/18-- stable hematoma -MBS shows patient with severe dysphasia  6  Elevated liver function,LDH low platelet count and anemia - ultrasound abdomen-- no hepatosplenomegaly -seen by Dr Tasia Catchings -I have asked GI to see as well   7.  Fever: etio unclear ?viral -BC 1/4 GPR --bacillus species -d/c vanc, cefepime Chest x-ray and UA are unremarkable. -UC neg -COVID-19 on admission neg -tmax 103--now afebrile -ID consult appreciated USG abdomen--no acute abnormality -cont doxy for possible tick bite illness  8.  Hypertension Continue  9 HYpokalemia: Replace  10. History of prostate cancer -pelvic x-rays  Shows diffuse osteopenia  11.Acute on Chronic anemia with melena and left subdural hematoma -GI saw pt--colonoscopy as out pt (pt preference) -EGD 11/2017--Barretts esophagus -Hemoglobin stable   CODE STATUS: full  DVT Prophylaxis: SCD  TOTAL TIME TAKING CARE OF THIS PATIENT: *30* minutes.  >50% time spent on counselling and coordination of care  POSSIBLE D/C IN 2-3 DAYS, DEPENDING ON CLINICAL CONDITION.  Note: This dictation was prepared with Dragon dictation along with smaller phrase technology. Any transcriptional errors that result from this process are unintentional.  Dustin Flock M.D on 09/25/2018 at 2:44 PM  Between 7am to 6pm - Pager - [phone]  After 6pm go to [email or web address] - password [email or web address] [facility]  Office  [phone]  CC: Primary care physician; [name], MDPatient ID: Mindi Junker, male   DOB: [birth year], 85 y.o.   MRN: [id number]

## 2018-09-25 NOTE — Progress Notes (Signed)
Patient's bladder scan this morning revealed 771. MD informed, orders to put in foley catheter. Foley catheter put in. Patient tolerated well, urine draining. Patient keeps taking of telemetry leads and gown. Attempted to put leads on back but patient still manages to take them off. Patient is alert and oriented x4 but seems to get confused at times.  Safety mitts put on. Will continue to monitor patient.

## 2018-09-25 NOTE — Progress Notes (Signed)
Notified by CCMD of all leads off. Upon assessment by NT and this RN, pt had removed all leads and pulled out his IV. New 22g IV placed in R AC, patient cleaned up, leads replaced, and safety mitts applied to prevent further pulling at lines. IV morphine given for pt c/o abdominal pain. Nursing staff will continue to monitor for any changes in patient status. Earleen Reaper, RN

## 2018-09-25 NOTE — Plan of Care (Signed)
  Problem: Pain Managment: Goal: General experience of comfort will improve Outcome: Progressing   Problem: Nutrition: Goal: Adequate nutrition will be maintained Outcome: Not Progressing Note: Patient still NPO, but receiving fluids.

## 2018-09-25 NOTE — Consult Note (Signed)
PHARMACY CONSULT NOTE - FOLLOW UP  Pharmacy Consult for Electrolyte Monitoring and Replacement   Recent Labs: Potassium (mmol/L)  Date Value  09/25/2018 4.3   Magnesium (mg/dL)  Date Value  09/25/2018 2.1   Calcium (mg/dL)  Date Value  09/25/2018 8.2 (L)   Albumin (g/dL)  Date Value  09/24/2018 2.5 (L)   Phosphorus (mg/dL)  Date Value  09/25/2018 2.8   Sodium (mmol/L)  Date Value  09/25/2018 153 (H)   Corrected Ca: 9.4   Assessment: Patient admitted with  Hypokalemia. Pharmacy consulted for electrolyte monitoring and replacement. Patient unable to take oral replacement at this time.   Goal of Therapy:  Electrolytes WNL  Plan:  Patient receiving NaCl w/KCL 48mEq @ 75 mL/hr.  No replacement warranted at this time.   Will F/U with AM labs and continue to replace electrolytes as needed.   Pernell Dupre, PharmD, BCPS Clinical Pharmacist 09/25/2018 6:48 AM

## 2018-09-25 NOTE — Plan of Care (Signed)
  Problem: Nutrition: Goal: Adequate nutrition will be maintained Outcome: Not Progressing Note: Patient still NPO, but receiving fluids.    Problem: Elimination: Goal: Will not experience complications related to urinary retention Outcome: Not Progressing Note: Patient still retaining urine, foley catheter had to be inserted.    Problem: Pain Managment: Goal: General experience of comfort will improve Outcome: Progressing

## 2018-09-26 ENCOUNTER — Inpatient Hospital Stay: Payer: Medicare Other

## 2018-09-26 DIAGNOSIS — S0083XA Contusion of other part of head, initial encounter: Secondary | ICD-10-CM

## 2018-09-26 DIAGNOSIS — W19XXXA Unspecified fall, initial encounter: Secondary | ICD-10-CM

## 2018-09-26 DIAGNOSIS — R58 Hemorrhage, not elsewhere classified: Secondary | ICD-10-CM

## 2018-09-26 DIAGNOSIS — R1310 Dysphagia, unspecified: Secondary | ICD-10-CM

## 2018-09-26 LAB — CBC
HCT: 29.5 % — ABNORMAL LOW (ref 39.0–52.0)
Hemoglobin: 9.3 g/dL — ABNORMAL LOW (ref 13.0–17.0)
MCH: 28.9 pg (ref 26.0–34.0)
MCHC: 31.5 g/dL (ref 30.0–36.0)
MCV: 91.6 fL (ref 80.0–100.0)
Platelets: UNDETERMINED 10*3/uL (ref 150–400)
RBC: 3.22 MIL/uL — ABNORMAL LOW (ref 4.22–5.81)
RDW: 15.1 % (ref 11.5–15.5)
WBC: 8.8 10*3/uL (ref 4.0–10.5)
nRBC: 0.2 % (ref 0.0–0.2)

## 2018-09-26 LAB — COMPREHENSIVE METABOLIC PANEL
ALT: 101 U/L — ABNORMAL HIGH (ref 0–44)
AST: 112 U/L — ABNORMAL HIGH (ref 15–41)
Albumin: 2.4 g/dL — ABNORMAL LOW (ref 3.5–5.0)
Alkaline Phosphatase: 101 U/L (ref 38–126)
Anion gap: 9 (ref 5–15)
BUN: 15 mg/dL (ref 8–23)
CO2: 25 mmol/L (ref 22–32)
Calcium: 7.9 mg/dL — ABNORMAL LOW (ref 8.9–10.3)
Chloride: 115 mmol/L — ABNORMAL HIGH (ref 98–111)
Creatinine, Ser: 0.87 mg/dL (ref 0.61–1.24)
GFR calc Af Amer: >60 mL/min (ref >60)
GFR calc non Af Amer: >60 mL/min (ref >60)
Glucose, Bld: 124 mg/dL — ABNORMAL HIGH (ref 70–99)
Potassium: 3.9 mmol/L (ref 3.5–5.1)
Sodium: 149 mmol/L — ABNORMAL HIGH (ref 135–145)
Total Bilirubin: 1 mg/dL (ref 0.3–1.2)
Total Protein: 4.6 g/dL — ABNORMAL LOW (ref 6.5–8.1)

## 2018-09-26 LAB — KAPPA/LAMBDA LIGHT CHAINS
Kappa free light chain: 59.5 mg/L — ABNORMAL HIGH (ref 3.3–19.4)
Kappa, lambda light chain ratio: 0.9 (ref 0.26–1.65)
Lambda free light chains: 66.4 mg/L — ABNORMAL HIGH (ref 5.7–26.3)

## 2018-09-26 LAB — MAGNESIUM: Magnesium: 1.9 mg/dL (ref 1.7–2.4)

## 2018-09-26 LAB — PHOSPHORUS: Phosphorus: 3 mg/dL (ref 2.5–4.6)

## 2018-09-26 LAB — CULTURE, BLOOD (ROUTINE X 2)
Culture: NO GROWTH
Culture: NO GROWTH
Special Requests: ADEQUATE

## 2018-09-26 MED ORDER — DEXTROSE 5 % IV SOLN: INTRAVENOUS | Status: DC

## 2018-09-26 MED ORDER — BACITRACIN-NEOMYCIN-POLYMYXIN 400-5-5000 EX OINT
TOPICAL_OINTMENT | Freq: Every day | CUTANEOUS | Status: DC
  Administered 2018-09-26 – 2018-09-27 (×2): via TOPICAL
  Administered 2018-09-28 – 2018-09-30 (×3): 1 via TOPICAL
  Administered 2018-10-01: 15:00:00 via TOPICAL
  Administered 2018-10-02 – 2018-10-13 (×12): 1 via TOPICAL
  Administered 2018-10-14: 11:00:00 via TOPICAL
  Filled 2018-09-26 (×19): qty 1

## 2018-09-26 MED ORDER — DEXTROSE 5 % IV SOLN
INTRAVENOUS | Status: AC
  Administered 2018-09-26 (×2): via INTRAVENOUS

## 2018-09-26 NOTE — Plan of Care (Signed)
PMT note:  Patient is resting in bed with eyes closed, and awakens on my arrival. He appears overwhelmed and states there is a lot going on, and currently he is waiting to hear about his swallow test. He states he is worried he did not pass it and does not know how he feels about moving forward from there if he did. He requests to speak further tomorrow.   No charge note.

## 2018-09-26 NOTE — Progress Notes (Signed)
Ch f/u with pt to finalize the HPOA AD for pt. Ch consulted w/ pt's nurse to determine if the pt presented to be lucid and oriented to understand what he was signing. Pt was able to talk and sit upright with no pain to complete the AD. Pt's daughter was contacted to inform her about the document being placed in the pt's chart and a copy with the original was left in the pt's room. Daughter was concerned about the pt completing a Living Will and if he was DNR or FULL code status. Ch assessed that the pt may need to discuss that at another time.  Goal: Ch f/u with pt's nurse to inform them of the pt's AD in the chart and that the pt was NOT ready to talk about completing a Living Will at this time.  Goal: Ch will f/u with the care team to determine if SNF placement will occur and the outcome of the swallow test.    09/26/18 1213  Clinical Encounter Type  Visited With Patient and family together;Health care provider  Visit Type Follow-up (AD completion )  Referral From Nurse  Consult/Referral To Chaplain  Spiritual Encounters  Spiritual Needs Other (Comment) (goals of care )  Stress Factors  Patient Stress Factors Health changes;Loss of control;Major life changes  Family Stress Factors Loss of control;Major life changes  Advance Directives (For Healthcare)  Does Patient Have a Medical Advance Directive? Yes  Type of Advance Directive [place] in Chart? No - copy requested  Healthcare Power of Attorney Requested and Now in Chart Copy in chart

## 2018-09-26 NOTE — Progress Notes (Addendum)
Glenn Antigua, MD [address], [place], [place], [place], [address] [address], [place], [place], [place], [address] Phone: [phone]  Fax: [phone]   Subjective: No abdominal pain, no nausea or vomiting, tolerating oral diet.   Objective: Exam: Vital signs in last 24 hours: Vitals:   09/26/18 0345 09/26/18 0349 09/26/18 0752 09/26/18 0946  BP: (!) 156/102 (!) 147/88 (!) 147/91   Pulse: 87 86 83   Resp: 18  19   Temp: 98.1 F (36.7 C)  98 F (36.7 C)   TempSrc: Oral  Oral   SpO2: 99% 100% (!) 86% 100%  Weight:      Height:       Weight change:   Intake/Output Summary (Last 24 hours) at 09/26/2018 1053 Last data filed at 09/26/2018 0139 Gross per 24 hour  Intake 1700.04 ml  Output 3325 ml  Net -1624.96 ml    General: No acute distress, AAO x3 Abd: Soft, NT/ND, No HSM Skin: Warm, no rashes Neck: Supple, Trachea midline   Lab Results: Lab Results  Component Value Date   WBC 8.8 09/26/2018   HGB 9.3 (L) 09/26/2018   HCT 29.5 (L) 09/26/2018   MCV 91.6 09/26/2018   PLT PLATELET CLUMPS NOTED ON SMEAR, UNABLE TO ESTIMATE 09/26/2018   Micro Results: Recent Results (from the past 240 hour(s))  Blood culture (routine x 2)     Status: None   Collection Time: 09/19/18  3:35 PM   Specimen: BLOOD  Result Value Ref Range Status   Specimen Description BLOOD RIGHT ANTECUBITAL  Final   Special Requests   Final    BOTTLES DRAWN AEROBIC AND ANAEROBIC Blood Culture adequate volume   Culture   Final    NO GROWTH 5 DAYS Performed at [facility], [address], [place], [place] [address]    Report Status 09/24/2018 FINAL  Final  Urine Culture     Status: None   Collection Time: 09/19/18  3:35 PM   Specimen: Urine, Random  Result Value Ref Range Status   Specimen Description   Final    URINE, RANDOM Performed at [facility], [address], [place], [place] [address]    Special Requests   Final    Normal Performed at [facility], [address], [place], [place] [address]    Culture   Final    NO GROWTH Performed at [facility], [place] [address], [place], [place] [address]    Report Status 09/20/2018 FINAL  Final  Blood culture (routine x 2)     Status: Abnormal   Collection Time: 09/19/18  3:36 PM   Specimen: BLOOD  Result Value Ref Range Status   Specimen Description   Final    BLOOD LEFT ANTECUBITAL Performed at [facility], [address] [place], [place] [address]    Special Requests   Final    BOTTLES DRAWN AEROBIC AND ANAEROBIC Blood Culture adequate volume Performed at [facility], [address] [place], [place] [address]    Culture  Setup Time   Final    AEROBIC BOTTLE ONLY GRAM POSITIVE RODS CRITICAL RESULT CALLED TO, READ BACK BY AND VERIFIED WITH: SHEEMA HALLAJI AT 0712 ON 09/20/2018 JJB    Culture (A)  Final    BACILLUS SPECIES NOT ANTHRACIS THE SIGNIFICANCE OF ISOLATING THIS ORGANISM FROM A SINGLE SET OF BLOOD CULTURES WHEN MULTIPLE SETS ARE DRAWN IS UNCERTAIN. PLEASE NOTIFY THE MICROBIOLOGY DEPARTMENT WITHIN ONE WEEK IF  SPECIATION AND SENSITIVITIES ARE REQUIRED. Performed at [facility], [place] [address], [place], [place] [address]    Report Status 09/22/2018 FINAL  Final  SARS Coronavirus 2 (CEPHEID- Performed in [place] hospital lab), Hosp Order     Status: None   Collection Time: 09/19/18  3:36 PM   Specimen: Nasopharyngeal Swab  Result Value Ref Range Status   SARS Coronavirus 2 NEGATIVE NEGATIVE Final    Comment: (NOTE) If result is NEGATIVE SARS-CoV-2 target nucleic acids are NOT DETECTED. The SARS-CoV-2 RNA is generally detectable in upper and lower  respiratory specimens during the acute phase of infection. The lowest  concentration of SARS-CoV-2 viral copies this assay can detect is 250  copies / mL. A negative result does not preclude SARS-CoV-2 infection  and should not be used as the sole basis for treatment or other   patient management decisions.  A negative result may occur with  improper specimen collection / handling, submission of specimen other  than nasopharyngeal swab, presence of viral mutation(s) within the  areas targeted by this assay, and inadequate number of viral copies  (<250 copies / mL). A negative result must be combined with clinical  observations, patient history, and epidemiological information. If result is POSITIVE SARS-CoV-2 target nucleic acids are DETECTED. The SARS-CoV-2 RNA is generally detectable in upper and lower  respiratory specimens dur ing the acute phase of infection.  Positive  results are indicative of active infection with SARS-CoV-2.  Clinical  correlation with patient history and other diagnostic information is  necessary to determine patient infection status.  Positive results do  not rule out bacterial infection or co-infection with other viruses. If result is PRESUMPTIVE POSTIVE SARS-CoV-2 nucleic acids MAY BE PRESENT.   A presumptive positive result was obtained on the submitted specimen  and confirmed on repeat testing.  While 2019 novel coronavirus  (SARS-CoV-2) nucleic acids may be present in the submitted sample  additional confirmatory testing may be necessary for epidemiological  and / or clinical management purposes  to differentiate between  SARS-CoV-2 and other Sarbecovirus currently known to infect humans.  If clinically indicated additional testing with an alternate test  methodology [phone]) is advised. The SARS-CoV-2 RNA is generally  detectable in upper and lower respiratory sp ecimens during the acute  phase of infection. The expected result is Negative. Fact Sheet for Patients:  StrictlyIdeas.no Fact Sheet for Healthcare Providers: BankingDealers.co.za This test is not yet approved or cleared by the [place] FDA and has been authorized for detection and/or diagnosis of SARS-CoV-2 by  FDA under an Emergency Use Authorization (EUA).  This EUA will remain in effect (meaning this test can be used) for the duration of the COVID-19 declaration under Section 564(b)(1) of the Act, 21 U.S.C. section 360bbb-3(b)(1), unless the authorization is terminated or revoked sooner. Performed at [facility], [address] [place], [place] [address]   Blood Culture ID Panel (Reflexed)     Status: None   Collection Time: 09/19/18  3:36 PM  Result Value Ref Range Status   Enterococcus species NOT DETECTED NOT DETECTED Final   Listeria monocytogenes NOT DETECTED NOT DETECTED Final   Staphylococcus species NOT DETECTED NOT DETECTED Final   Staphylococcus aureus (BCID) NOT DETECTED NOT DETECTED Final   Streptococcus species NOT DETECTED NOT DETECTED Final   Streptococcus agalactiae NOT DETECTED NOT DETECTED Final   Streptococcus pneumoniae NOT DETECTED NOT DETECTED Final   Streptococcus pyogenes NOT DETECTED NOT DETECTED Final   Acinetobacter  baumannii NOT DETECTED NOT DETECTED Final   Enterobacteriaceae species NOT DETECTED NOT DETECTED Final   Enterobacter cloacae complex NOT DETECTED NOT DETECTED Final   Escherichia coli NOT DETECTED NOT DETECTED Final   Klebsiella oxytoca NOT DETECTED NOT DETECTED Final   Klebsiella pneumoniae NOT DETECTED NOT DETECTED Final   Proteus species NOT DETECTED NOT DETECTED Final   Serratia marcescens NOT DETECTED NOT DETECTED Final   Haemophilus influenzae NOT DETECTED NOT DETECTED Final   Neisseria meningitidis NOT DETECTED NOT DETECTED Final   Pseudomonas aeruginosa NOT DETECTED NOT DETECTED Final   Candida albicans NOT DETECTED NOT DETECTED Final   Candida glabrata NOT DETECTED NOT DETECTED Final   Candida krusei NOT DETECTED NOT DETECTED Final   Candida parapsilosis NOT DETECTED NOT DETECTED Final   Candida tropicalis NOT DETECTED NOT DETECTED Final    Comment: Performed at [facility], [address] [place], [place]  [address]  CULTURE, BLOOD (ROUTINE X 2) w Reflex to ID Panel     Status: None   Collection Time: 09/21/18  1:00 PM   Specimen: BLOOD  Result Value Ref Range Status   Specimen Description BLOOD LEFT ANTECUBITAL  Final   Special Requests   Final    BOTTLES DRAWN AEROBIC AND ANAEROBIC Blood Culture adequate volume   Culture   Final    NO GROWTH 5 DAYS Performed at [facility], [address] [place], [place] [address]    Report Status 09/26/2018 FINAL  Final  CULTURE, BLOOD (ROUTINE X 2) w Reflex to ID Panel     Status: None   Collection Time: 09/21/18  1:00 PM   Specimen: BLOOD  Result Value Ref Range Status   Specimen Description BLOOD BLOOD RIGHT HAND  Final   Special Requests   Final    BOTTLES DRAWN AEROBIC ONLY Blood Culture results may not be optimal due to an inadequate volume of blood received in culture bottles   Culture   Final    NO GROWTH 5 DAYS Performed at [facility], [address], [place], [place] [address]    Report Status 09/26/2018 FINAL  Final   Studies/Results: No results found. Medications:  Scheduled Meds: . feeding supplement (ENSURE ENLIVE)  237 mL Oral TID BM  . multivitamin with minerals  1 tablet Oral Daily  . neomycin-bacitracin-polymyxin   Topical Daily  . pantoprazole (PROTONIX) IV  40 mg Intravenous Q12H  . vitamin C  250 mg Oral BID   Continuous Infusions: . dextrose 75 mL/hr at 09/26/18 1009  . doxycycline (VIBRAMYCIN) IV Stopped (09/25/18 2300)   PRN Meds:.acetaminophen **OR** acetaminophen, HYDROcodone-acetaminophen, morphine injection, ondansetron **OR** ondansetron (ZOFRAN) IV, polyethylene glycol   Assessment: Active Problems:   Syncope   Blood in stool   Elevated transaminase level   Anemia   Thrombocytopenia (HCC)    Plan: Transaminases continue to improve Bilirubin and alk phos is normal Acute hepatitis panel negative Rest of the serology ordered by Dr. Vicente Males is pending  Abdominal ultrasound  reported normal-appearing liver and gallbladder  Elevated transaminases possibly due to hypotension on admission and chronic alcohol use  Continue to trend CMP Avoid hepatotoxic drugs, including alcohol Avoid hypotension  We will follow-up pending liver workup   LOS: 7 days   Glenn Antigua, MD 09/26/2018, 10:53 AM

## 2018-09-26 NOTE — Progress Notes (Signed)
[facility] [place] NAME: [name]    MR#:  [id number]  DATE OF BIRTH:  [birth year]  SUBJECTIVE:   Patient currently denying any symptoms he is doing better able to communicate more clearly  REVIEW OF SYSTEMS:   Review of Systems  Constitutional: Negative for chills, fever and weight loss.  HENT: Negative for ear discharge, ear pain and nosebleeds.   Eyes: Negative for blurred vision, pain and discharge.  Respiratory: Negative for sputum production, shortness of breath, wheezing and stridor.   Cardiovascular: Negative for chest pain, palpitations, orthopnea and PND.  Gastrointestinal: Negative for abdominal pain, diarrhea, melena, nausea and vomiting.  Genitourinary: Negative for frequency and urgency.  Musculoskeletal: Negative for back pain, falls and joint pain.  Neurological: Positive for weakness. Negative for sensory change, speech change and focal weakness.  Psychiatric/Behavioral: Negative for depression and hallucinations. The patient is not nervous/anxious.      DRUG ALLERGIES:  No Known Allergies  VITALS:  Blood pressure (!) 147/91, pulse 83, temperature 98 F (36.7 C), temperature source Oral, resp. rate 19, height 6' (1.829 m), weight 60.5 kg, SpO2 100 %.  PHYSICAL EXAMINATION:   Physical Exam  GENERAL:  85 y.o.-year-old patient lying in the bed with no acute distress.  EYES: Pupils equal, round, reactive to light and accommodation. No scleral icterus. Extraocular muscles intact.  HEENT: Head atraumatic, normocephalic. Oropharynx and nasopharynx clear.  Bruising on his forehead NECK:  Supple, no jugular venous distention. No thyroid enlargement, no tenderness.  LUNGS: Normal breath sounds bilaterally, no wheezing, rales, rhonchi. No use of accessory muscles of respiration.  CARDIOVASCULAR: S1, S2 normal. No murmurs, rubs, or gallops.  ABDOMEN: Soft, nontender, nondistended. Bowel sounds present. No  organomegaly or mass.  EXTREMITIES: No cyanosis, clubbing or edema b/l.    NEUROLOGIC: Cranial nerves II through XII are intact. No focal Motor or sensory deficits b/l.  Overall weak, mild dysarthria PSYCHIATRIC:  patient is alert and oriented x 3.  SKIN: No obvious rash, lesion, or ulcer.   LABORATORY PANEL:  CBC Recent Labs  Lab 09/26/18 0517  WBC 8.8  HGB 9.3*  HCT 29.5*  PLT PLATELET CLUMPS NOTED ON SMEAR, UNABLE TO ESTIMATE    Chemistries  Recent Labs  Lab 09/26/18 0517  NA 149*  K 3.9  CL 115*  CO2 25  GLUCOSE 124*  BUN 15  CREATININE 0.87  CALCIUM 7.9*  MG 1.9  AST 112*  ALT 101*  ALKPHOS 101  BILITOT 1.0   Cardiac Enzymes Recent Labs  Lab 09/20/18 0533  TROPONINI 0.15*   RADIOLOGY:  No results found. ASSESSMENT AND PLAN:   85 year old male with history of hypertension who has had decreased appetite for several weeks who presents after multiple falls today and syncopal episode.  1. Syncope with hypotension secondary dehydration, hypokalemia -holding of aspirin secondary to melena and Subdural hematoma Physical therapy consultation --SNF pt and dter sheri agreeable CT head on admission was unremarkable -MRI brain showed left frontal cortex subdural hematoma with s/o infarct -Patient seen by neurosurgery recommended to monitor patient -Dysphasia swallowing evaluation today   2.  Acute kidney injury in the setting of dehydration:  -Hold nephrotoxic medications including blood pressure medications  - received IV fluids.  -Baseline creatinine 0.8 in October 2018 -Renal function normal -Had urine retention  3.  Urinary retention Foley in place  4.  Hypernatremia due to free water deficit we will give D5W monitor sodium   5  Left Subdural Hematoma  S/p fall at home (delayed effects seen on MRI brain) with leftfrontal cortex CVA/infarct -hold ASA -seen by Dr Cari Caraway -Repeat CT head 09/20/18-- stable hematoma -MBS shows patient with severe  dysphasia  6  Elevated liver function,LDH low platelet count and anemia - ultrasound abdomen-- no hepatosplenomegaly -seen by Dr Tasia Catchings -I have asked GI to see as well   7.  Fever: etio unclear ?viral -BC 1/4 GPR --bacillus species -d/c vanc, cefepime Chest x-ray and UA are unremarkable. -UC neg -COVID-19 on admission neg -tmax 103--now afebrile -ID consult appreciated USG abdomen--no acute abnormality -cont doxy for possible tick bite illness  8.  Hypertension Continue  9 HYpokalemia: Replace  10. History of prostate cancer -pelvic x-rays  Shows diffuse osteopenia  11.Acute on Chronic anemia with melena and left subdural hematoma -GI saw pt--colonoscopy as out pt (pt preference) -EGD 11/2017--Barretts esophagus -Hemoglobin stable   CODE STATUS: full  DVT Prophylaxis: SCD  TOTAL TIME TAKING CARE OF THIS PATIENT: *30* minutes.  >50% time spent on counselling and coordination of care  POSSIBLE D/C IN 2-3 DAYS, DEPENDING ON CLINICAL CONDITION.  Note: This dictation was prepared with Dragon dictation along with smaller phrase technology. Any transcriptional errors that result from this process are unintentional.  Dustin Flock M.D on 09/26/2018 at 1:36 PM  Between 7am to 6pm - Pager - [phone]  After 6pm go to [email or web address] - password [email or web address] [facility]  Office  [phone]  CC: Primary care physician; [name], MDPatient ID: Mindi Junker, male   DOB: Jun 11, 1933, 85 y.o.   MRN: [id number]

## 2018-09-26 NOTE — Care Management Important Message (Signed)
Important Message  Patient Details  Name: [name] MRN: [id number] Date of Birth: 07/07/1933   Medicare Important Message Given:  Yes     Dannette Barbara 09/26/2018, 2:06 PM

## 2018-09-26 NOTE — TOC Progression Note (Signed)
Transition of Care [facility]) - Progression Note    Patient Details  Name: [name] MRN: [id number] Date of Birth: 08/21/1933  Transition of Care [facility]) CM/SW Contact  Elza Rafter, RN Phone Number: 09/26/2018, 3:28 PM  Clinical Narrative:   Family meeting to take place tomorrow at [place].  Daughter [name] will be here to meet with Dr. Posey Pronto.  Patient did not pass swallow study.  Daughter [name] states, "They will have to make a decision".  Will continue to follow and assist with transitions of care.      Expected Discharge Plan: Skilled Nursing Facility Barriers to Discharge: Continued Medical Work up  Expected Discharge Plan and Services Expected Discharge Plan: [place]   Discharge Planning Services: CM Consult                                           Social Determinants of Health (SDOH) Interventions    Readmission Risk Interventions Readmission Risk Prevention Plan 09/21/2018  Post Dischage Appt Complete  Medication Screening Complete  Transportation Screening Complete  Some recent data might be hidden

## 2018-09-26 NOTE — Progress Notes (Signed)
Pt's daughter, [name], updated on pt's progress and plan of care for the day.  Barium swallow study to be performed today at 1300.

## 2018-09-26 NOTE — Plan of Care (Signed)

## 2018-09-26 NOTE — Progress Notes (Signed)
   Date of Admission:  09/19/2018       Subjective: says he is feeling better Thirsty, wants to drink juice   Medications:  . feeding supplement (ENSURE ENLIVE)  237 mL Oral TID BM  . multivitamin with minerals  1 tablet Oral Daily  . neomycin-bacitracin-polymyxin   Topical Daily  . pantoprazole (PROTONIX) IV  40 mg Intravenous Q12H  . vitamin C  250 mg Oral BID    Objective: Vital signs in last 24 hours: Temp:  [97.9 F (36.6 C)-98.1 F (36.7 C)] 98 F (36.7 C) (06/29 0752) Pulse Rate:  [83-122] 83 (06/29 0752) Resp:  [16-19] 19 (06/29 0752) BP: (147-156)/(86-102) 147/91 (06/29 0752) SpO2:  [86 %-100 %] 100 % (06/29 0946)  PHYSICAL EXAM:  General: Alert, cooperative, no distress, appears stated age.  Left sided ecchymosis-face  Lungs: b/l air entry Heart: s1s2 Abdomen: Soft, Neurologic: Grossly non-focal  Lab Results Recent Labs    09/25/18 0545 09/26/18 0517  WBC 11.7* 8.8  HGB 9.4* 9.3*  HCT 28.6* 29.5*  NA 153* 149*  K 4.3 3.9  CL 121* 115*  CO2 22 25  BUN 22 15  CREATININE 1.28* 0.87   Liver Panel Recent Labs    09/24/18 1231 09/26/18 0517  PROT 5.0* 4.6*  ALBUMIN 2.5* 2.4*  AST 251* 112*  ALT 158* 101*  ALKPHOS 133* 101  BILITOT 1.2 1.0  BILIDIR 0.4*  --   IBILI 0.8  --    Sedimentation Rate No results for input(s): ESRSEDRATE in the last 72 hours. C-Reactive Protein No results for input(s): CRP in the last 72 hours.  Microbiology:  Studies/Results: No results found.   Assessment/Plan: Pt admitted with syncope, fall, trauma   Syncope could be from postural hypotension .acute anemia, need to r/o cardiac cause -echo done but result pending  Fever with pancytopenia- with h/o tick exposure,  Ehrlichia/RMSFis the working diagnosis. Fever  Resolved promptly after starting DOXY. Marland Kitchen He will get DOXY until 09/29/18. Once able to swallow can change to PO   Abnormal LFTS, high LDH, high ferritin, low normal haptoglobulin- All of the  above  improving so could be related to infection-  like RMSF or ehrlichia- RMSF IgG is 3:74, we need to repeat another serology in 4 weeks as it should increase if this is an acute infection due to Rickettsia. Ehrlichia PCR still pending  Seen by hemeonc who sent MM panel   Gram positive rod in 1 of 4 blood culture-bacillus acontaminant- no need to treat Myeloma is being questioned because of plasmacytoid cells- Panel sent  H/o prostate ca- had cryoablation-   Dysphagia- being worked up with barium swallow  Discussed the management with patient and his nurse

## 2018-09-26 NOTE — Consult Note (Signed)
PHARMACY CONSULT NOTE - FOLLOW UP  Pharmacy Consult for Electrolyte Monitoring and Replacement   Recent Labs: Potassium (mmol/L)  Date Value  09/26/2018 3.9   Magnesium (mg/dL)  Date Value  09/26/2018 1.9   Calcium (mg/dL)  Date Value  09/26/2018 7.9 (L)   Albumin (g/dL)  Date Value  09/26/2018 2.4 (L)   Phosphorus (mg/dL)  Date Value  09/26/2018 3.0   Sodium (mmol/L)  Date Value  09/26/2018 149 (H)   Corrected Ca: 9.4   Assessment: Patient admitted with  Hypokalemia. Pharmacy consulted for electrolyte monitoring and replacement. Patient unable to take oral replacement at this time.   Goal of Therapy:  Electrolytes WNL  Plan:  Patient receiving NaCl w/KCL 108mEq @ 75 mL/hr - now d/c'ed .  No replacement warranted at this time.   Will F/U with AM labs and continue to replace electrolytes as needed.   Oswald Hillock, PharmD, BCPS Clinical Pharmacist 09/26/2018 7:56 AM

## 2018-09-26 NOTE — Evaluation (Signed)
Objective Swallowing Evaluation: Type of Study: MBS-Modified Barium Swallow Study   Patient Details  Name: LOGEN HEINTZELMAN MRN: [id number] Date of Birth: Feb 17, 1934  Today's Date: 09/26/2018 Time: SLP Start Time (ACUTE ONLY): 1345 -SLP Stop Time (ACUTE ONLY): 1455  SLP Time Calculation (min) (ACUTE ONLY): 70 min   Past Medical History:  Past Medical History:  Diagnosis Date  . Arthritis   . Cancer [facility])    Prostate Cancer  . Hypertension    Past Surgical History:  Past Surgical History:  Procedure Laterality Date  . ESOPHAGOGASTRODUODENOSCOPY (EGD) WITH PROPOFOL N/A 12/06/2017   Procedure: ESOPHAGOGASTRODUODENOSCOPY (EGD) WITH PROPOFOL;  Surgeon: Jonathon Bellows, MD;  Location: [facility] ENDOSCOPY;  Service: Gastroenterology;  Laterality: N/A;  . PROSTATE CRYOABLATION     HPI: Mikie Misner  is a 85 y.o. male with a known history of hypertension who presents today to the emergency room due to for falls today at home.  Patient reports for the past 3 days he has had dizziness and feelings of lightheadedness.  He denies chest pain, shortness of breath, cough, fever or urinary symptoms.  He was noted to have large amount of bruising on the left side of his forehead and skin tears.  His blood pressure was also noted to be low when EMS arrived.  He was given 250 cc normal saline bolus.     Subjective: Pt alert and verbally responsive.  Dysfluency appears to be improved.    Assessment / Plan / Recommendation  CHL IP CLINICAL IMPRESSIONS 09/26/2018  Clinical Impression This 85 year old man, with acute left subdural hematoma and acute ischemic cortical infarct involving the underlying left frontal operculum, is presenting with severe oropharyngeal dysphagia. He has severely restricted hyolaryngeal excursion which prevents an effective pharyngeal swallow with both thickened liquid (nectar consistency) and puree boluses remaining in the pharynx; minimal amounts entering the esophagus w/ f/u, dry  swallows. Aspiration (Silent) was noted w/ both nectar-thick liquid and puree bolus residue primarily post initial swallow; nectar liquid trial aspirated during the swallow x1 - suspect impacted by the amount of pharyngeal residue already present from prior trials. He has a much delayed, weak cough response w/ increased effort cued by SLP. Pt was unable to clear the aspirated material which remained in the upper trachea (Silently).  The patient is currently unsafe for any PO intake as the aspiration occurring would significantly impact Pulmonary status. Recommend continuing NPO status with no oral meds. Recommend frequent oral care for oral hydration and stimulation of swallowing.  Recommend f/u consultation w/ MD and palliative care services re: evaluation results and pt's [place]; Daughter to be included also.  SLP Visit Diagnosis Dysphagia, oropharyngeal phase (R13.12);Dysphagia, pharyngoesophageal phase (R13.14)  Attention and concentration deficit following --  Frontal lobe and executive function deficit following --  Impact on safety and function Severe aspiration risk;Risk for inadequate nutrition/hydration      CHL IP TREATMENT RECOMMENDATION 09/26/2018  Treatment Recommendations Therapy as outlined in treatment plan below     Prognosis 09/26/2018  Prognosis for Safe Diet Advancement Guarded  Barriers to Reach Goals Severity of deficits  Barriers/Prognosis Comment --    CHL IP DIET RECOMMENDATION 09/26/2018  SLP Diet Recommendations NPO  Liquid Administration via --  Medication Administration Via alternative means  Compensations --  Postural Changes --      CHL IP OTHER RECOMMENDATIONS 09/26/2018  Recommended Consults Consider GI evaluation  Oral Care Recommendations Oral care QID;Patient independent with oral care;Staff/trained caregiver to provide oral care  Other Recommendations --  CHL IP FOLLOW UP RECOMMENDATIONS 09/26/2018  Follow up Recommendations Home health SLP      CHL  IP FREQUENCY AND DURATION 09/26/2018  Speech Therapy Frequency (ACUTE ONLY) min 3x week  Treatment Duration 2 weeks           CHL IP ORAL PHASE 09/26/2018  Oral Phase Impaired  Oral - Pudding Teaspoon --  Oral - Pudding Cup --  Oral - Honey Teaspoon --  Oral - Honey Cup --  Oral - Nectar Teaspoon --  Oral - Nectar Cup --  Oral - Nectar Straw --  Oral - Thin Teaspoon --  Oral - Thin Cup --  Oral - Thin Straw --  Oral - Puree --  Oral - Mech Soft --  Oral - Regular --  Oral - Multi-Consistency --  Oral - Pill --  Oral Phase - Comment min increased oral phase time w/ bolus preparation for timely A-P transfer    CHL IP PHARYNGEAL PHASE 09/26/2018  Pharyngeal Phase Impaired  Pharyngeal- Pudding Teaspoon --  Pharyngeal --  Pharyngeal- Pudding Cup --  Pharyngeal --  Pharyngeal- Honey Teaspoon --  Pharyngeal --  Pharyngeal- Honey Cup --  Pharyngeal --  Pharyngeal- Nectar Teaspoon --  Pharyngeal --  Pharyngeal- Nectar Cup --  Pharyngeal --  Pharyngeal- Nectar Straw --  Pharyngeal --  Pharyngeal- Thin Teaspoon --  Pharyngeal --  Pharyngeal- Thin Cup --  Pharyngeal --  Pharyngeal- Thin Straw --  Pharyngeal --  Pharyngeal- Puree --  Pharyngeal --  Pharyngeal- Mechanical Soft --  Pharyngeal --  Pharyngeal- Regular --  Pharyngeal --  Pharyngeal- Multi-consistency --  Pharyngeal --  Pharyngeal- Pill --  Pharyngeal --  Pharyngeal Comment decreased laryngeal excursion and pharyngeal pressure during the swallow resulting in Moderate+ pharyngeal residue post swallow; reduced airway protection during and after the swallow; decreased pharyngeal sensation to the amount of bolus residue remaining in pharynx; inability to utilize f/u swallows to clear the pharyngeal residue successfully      CHL IP CERVICAL ESOPHAGEAL PHASE 09/26/2018  Cervical Esophageal Phase Impaired  Pudding Teaspoon --  Pudding Cup --  Honey Teaspoon --  Honey Cup --  Nectar Teaspoon --  Nectar Cup --   Nectar Straw --  Thin Teaspoon --  Thin Cup --  Thin Straw --  Puree --  Mechanical Soft --  Regular --  Multi-consistency --  Pill --  Cervical Esophageal Comment apparent decreased efficiency in bolus clearance through the UES/cervical Esophagus        Orinda Kenner, MS, CCC-SLP Watson,Katherine 09/26/2018, 5:16 PM

## 2018-09-27 DIAGNOSIS — Z7189 Other specified counseling: Secondary | ICD-10-CM

## 2018-09-27 DIAGNOSIS — Z515 Encounter for palliative care: Secondary | ICD-10-CM

## 2018-09-27 LAB — MULTIPLE MYELOMA PANEL, SERUM
Albumin SerPl Elph-Mcnc: 2.3 g/dL — ABNORMAL LOW (ref 2.9–4.4)
Albumin/Glob SerPl: 1.3 (ref 0.7–1.7)
Alpha 1: 0.3 g/dL (ref 0.0–0.4)
Alpha2 Glob SerPl Elph-Mcnc: 0.5 g/dL (ref 0.4–1.0)
B-Globulin SerPl Elph-Mcnc: 0.5 g/dL — ABNORMAL LOW (ref 0.7–1.3)
Gamma Glob SerPl Elph-Mcnc: 0.6 g/dL (ref 0.4–1.8)
Globulin, Total: 1.8 g/dL — ABNORMAL LOW (ref 2.2–3.9)
IgA: 209 mg/dL (ref 61–437)
IgG (Immunoglobin G), Serum: 694 mg/dL (ref 603–1613)
IgM (Immunoglobulin M), Srm: 89 mg/dL (ref 15–143)
Total Protein ELP: 4.1 g/dL — ABNORMAL LOW (ref 6.0–8.5)

## 2018-09-27 LAB — BASIC METABOLIC PANEL
Anion gap: 5 (ref 5–15)
BUN: 12 mg/dL (ref 8–23)
CO2: 29 mmol/L (ref 22–32)
Calcium: 7.6 mg/dL — ABNORMAL LOW (ref 8.9–10.3)
Chloride: 108 mmol/L (ref 98–111)
Creatinine, Ser: 0.71 mg/dL (ref 0.61–1.24)
GFR calc Af Amer: >60 mL/min (ref >60)
GFR calc non Af Amer: >60 mL/min (ref >60)
Glucose, Bld: 119 mg/dL — ABNORMAL HIGH (ref 70–99)
Potassium: 3.2 mmol/L — ABNORMAL LOW (ref 3.5–5.1)
Sodium: 142 mmol/L (ref 135–145)

## 2018-09-27 LAB — CBC
HCT: 26.1 % — ABNORMAL LOW (ref 39.0–52.0)
Hemoglobin: 8.4 g/dL — ABNORMAL LOW (ref 13.0–17.0)
MCH: 29 pg (ref 26.0–34.0)
MCHC: 32.2 g/dL (ref 30.0–36.0)
MCV: 90 fL (ref 80.0–100.0)
Platelets: 302 10*3/uL (ref 150–400)
RBC: 2.9 MIL/uL — ABNORMAL LOW (ref 4.22–5.81)
RDW: 14.7 % (ref 11.5–15.5)
WBC: 8.3 10*3/uL (ref 4.0–10.5)
nRBC: 0.4 % — ABNORMAL HIGH (ref 0.0–0.2)

## 2018-09-27 LAB — HEPATIC FUNCTION PANEL
ALT: 78 U/L — ABNORMAL HIGH (ref 0–44)
AST: 72 U/L — ABNORMAL HIGH (ref 15–41)
Albumin: 2.3 g/dL — ABNORMAL LOW (ref 3.5–5.0)
Alkaline Phosphatase: 92 U/L (ref 38–126)
Bilirubin, Direct: 0.4 mg/dL — ABNORMAL HIGH (ref 0.0–0.2)
Indirect Bilirubin: 0.9 mg/dL (ref 0.3–0.9)
Total Bilirubin: 1.3 mg/dL — ABNORMAL HIGH (ref 0.3–1.2)
Total Protein: 4.7 g/dL — ABNORMAL LOW (ref 6.5–8.1)

## 2018-09-27 LAB — HIV ANTIBODY (ROUTINE TESTING W REFLEX): HIV Screen 4th Generation wRfx: NONREACTIVE

## 2018-09-27 LAB — LACTATE DEHYDROGENASE: LDH: 443 U/L — ABNORMAL HIGH (ref 98–192)

## 2018-09-27 LAB — EBV AB TO VIRAL CAPSID AG PNL, IGG+IGM
EBV VCA IgG: 192 U/mL — ABNORMAL HIGH (ref 0.0–17.9)
EBV VCA IgM: 64.8 U/mL — ABNORMAL HIGH (ref 0.0–35.9)

## 2018-09-27 LAB — ANA W/REFLEX IF POSITIVE: Anti Nuclear Antibody (ANA): NEGATIVE

## 2018-09-27 LAB — FERRITIN: Ferritin: 829 ng/mL — ABNORMAL HIGH (ref 24–336)

## 2018-09-27 MED ORDER — POTASSIUM CHLORIDE 10 MEQ/100ML IV SOLN
10.0000 meq | INTRAVENOUS | Status: AC
  Administered 2018-09-27 (×4): 10 meq via INTRAVENOUS
  Filled 2018-09-27 (×4): qty 100

## 2018-09-27 MED ORDER — POTASSIUM CHLORIDE 10 MEQ/100ML IV SOLN
10.0000 meq | INTRAVENOUS | Status: AC
  Administered 2018-09-27 (×2): 10 meq via INTRAVENOUS
  Filled 2018-09-27 (×2): qty 100

## 2018-09-27 NOTE — Progress Notes (Signed)
Glenn Antigua, MD [address] [place], [place], [place], [address] [address], [place], [place], [place], [address] Phone: [phone]  Fax: [phone]   Subjective: Patient denies any abdominal pain.     Objective: Exam: Vital signs in last 24 hours: Vitals:   09/26/18 2039 09/27/18 0326 09/27/18 0740 09/27/18 1205  BP: (!) 149/81 (!) 144/90 (!) 148/85   Pulse: 87 82 86 (!) 101  Resp: 16 16 19    Temp: 98.1 F (36.7 C) 97.6 F (36.4 C) 98.4 F (36.9 C)   TempSrc: Oral Oral Oral   SpO2: 100% 96% 98%   Weight:      Height:       Weight change:   Intake/Output Summary (Last 24 hours) at 09/27/2018 1313 Last data filed at 09/27/2018 8101 Gross per 24 hour  Intake 776.05 ml  Output 1700 ml  Net -923.95 ml    General: No acute distress, AAO x3 Abd: Soft, NT/ND, No HSM Skin: Warm, no rashes Neck: Supple, Trachea midline   Lab Results: Lab Results  Component Value Date   WBC 8.3 09/27/2018   HGB 8.4 (L) 09/27/2018   HCT 26.1 (L) 09/27/2018   MCV 90.0 09/27/2018   PLT 302 09/27/2018   Micro Results: Recent Results (from the past 240 hour(s))  Blood culture (routine x 2)     Status: None   Collection Time: 09/19/18  3:35 PM   Specimen: BLOOD  Result Value Ref Range Status   Specimen Description BLOOD RIGHT ANTECUBITAL  Final   Special Requests   Final    BOTTLES DRAWN AEROBIC AND ANAEROBIC Blood Culture adequate volume   Culture   Final    NO GROWTH 5 DAYS Performed at [facility] [address], [place], [place] [address]    Report Status 09/24/2018 FINAL  Final  Urine Culture     Status: None   Collection Time: 09/19/18  3:35 PM   Specimen: Urine, Random  Result Value Ref Range Status   Specimen Description   Final    URINE, RANDOM Performed at [facility], [address] [place], [place] [address]    Special Requests   Final    Normal Performed at [facility], [address] [place], [place]  [address]    Culture   Final    NO GROWTH Performed at [facility], [place] [address], [place], [place] [address]    Report Status 09/20/2018 FINAL  Final  Blood culture (routine x 2)     Status: Abnormal   Collection Time: 09/19/18  3:36 PM   Specimen: BLOOD  Result Value Ref Range Status   Specimen Description   Final    BLOOD LEFT ANTECUBITAL Performed at [facility], [address] [place], [place] [address]    Special Requests   Final    BOTTLES DRAWN AEROBIC AND ANAEROBIC Blood Culture adequate volume Performed at [facility], [address] [place], [place] [address]    Culture  Setup Time   Final    AEROBIC BOTTLE ONLY GRAM POSITIVE RODS CRITICAL RESULT CALLED TO, READ BACK BY AND VERIFIED WITH: SHEEMA HALLAJI AT 7619 ON 09/20/2018 JJB    Culture (A)  Final    BACILLUS SPECIES NOT ANTHRACIS THE SIGNIFICANCE OF ISOLATING THIS ORGANISM FROM A SINGLE SET OF BLOOD CULTURES WHEN MULTIPLE SETS ARE DRAWN IS UNCERTAIN. PLEASE NOTIFY THE MICROBIOLOGY DEPARTMENT WITHIN ONE WEEK IF SPECIATION AND SENSITIVITIES ARE REQUIRED. Performed at  [facility], [place] [address] [place], [place] [address]    Report Status 09/22/2018 FINAL  Final  SARS Coronavirus 2 (CEPHEID- Performed in [place] hospital lab), Hosp Order     Status: None   Collection Time: 09/19/18  3:36 PM   Specimen: Nasopharyngeal Swab  Result Value Ref Range Status   SARS Coronavirus 2 NEGATIVE NEGATIVE Final    Comment: (NOTE) If result is NEGATIVE SARS-CoV-2 target nucleic acids are NOT DETECTED. The SARS-CoV-2 RNA is generally detectable in upper and lower  respiratory specimens during the acute phase of infection. The lowest  concentration of SARS-CoV-2 viral copies this assay can detect is 250  copies / mL. A negative result does not preclude SARS-CoV-2 infection  and should not be used as the sole basis for treatment or other  patient management decisions.  A negative result may  occur with  improper specimen collection / handling, submission of specimen other  than nasopharyngeal swab, presence of viral mutation(s) within the  areas targeted by this assay, and inadequate number of viral copies  (<250 copies / mL). A negative result must be combined with clinical  observations, patient history, and epidemiological information. If result is POSITIVE SARS-CoV-2 target nucleic acids are DETECTED. The SARS-CoV-2 RNA is generally detectable in upper and lower  respiratory specimens dur ing the acute phase of infection.  Positive  results are indicative of active infection with SARS-CoV-2.  Clinical  correlation with patient history and other diagnostic information is  necessary to determine patient infection status.  Positive results do  not rule out bacterial infection or co-infection with other viruses. If result is PRESUMPTIVE POSTIVE SARS-CoV-2 nucleic acids MAY BE PRESENT.   A presumptive positive result was obtained on the submitted specimen  and confirmed on repeat testing.  While 2019 novel coronavirus  (SARS-CoV-2) nucleic acids may be present in the submitted sample  additional confirmatory testing may be necessary for epidemiological  and / or clinical management purposes  to differentiate between  SARS-CoV-2 and other Sarbecovirus currently known to infect humans.  If clinically indicated additional testing with an alternate test  methodology [phone]) is advised. The SARS-CoV-2 RNA is generally  detectable in upper and lower respiratory sp ecimens during the acute  phase of infection. The expected result is Negative. Fact Sheet for Patients:  StrictlyIdeas.no Fact Sheet for Healthcare Providers: BankingDealers.co.za This test is not yet approved or cleared by the [place] FDA and has been authorized for detection and/or diagnosis of SARS-CoV-2 by FDA under an Emergency Use Authorization (EUA).  This  EUA will remain in effect (meaning this test can be used) for the duration of the COVID-19 declaration under Section 564(b)(1) of the Act, 21 U.S.C. section 360bbb-3(b)(1), unless the authorization is terminated or revoked sooner. Performed at [facility], [address] [place], [place] [address]   Blood Culture ID Panel (Reflexed)     Status: None   Collection Time: 09/19/18  3:36 PM  Result Value Ref Range Status   Enterococcus species NOT DETECTED NOT DETECTED Final   Listeria monocytogenes NOT DETECTED NOT DETECTED Final   Staphylococcus species NOT DETECTED NOT DETECTED Final   Staphylococcus aureus (BCID) NOT DETECTED NOT DETECTED Final   Streptococcus species NOT DETECTED NOT DETECTED Final   Streptococcus agalactiae NOT DETECTED NOT DETECTED Final   Streptococcus pneumoniae NOT DETECTED NOT DETECTED Final   Streptococcus pyogenes NOT DETECTED NOT DETECTED Final   Acinetobacter baumannii NOT DETECTED NOT DETECTED Final  Enterobacteriaceae species NOT DETECTED NOT DETECTED Final   Enterobacter cloacae complex NOT DETECTED NOT DETECTED Final   Escherichia coli NOT DETECTED NOT DETECTED Final   Klebsiella oxytoca NOT DETECTED NOT DETECTED Final   Klebsiella pneumoniae NOT DETECTED NOT DETECTED Final   Proteus species NOT DETECTED NOT DETECTED Final   Serratia marcescens NOT DETECTED NOT DETECTED Final   Haemophilus influenzae NOT DETECTED NOT DETECTED Final   Neisseria meningitidis NOT DETECTED NOT DETECTED Final   Pseudomonas aeruginosa NOT DETECTED NOT DETECTED Final   Candida albicans NOT DETECTED NOT DETECTED Final   Candida glabrata NOT DETECTED NOT DETECTED Final   Candida krusei NOT DETECTED NOT DETECTED Final   Candida parapsilosis NOT DETECTED NOT DETECTED Final   Candida tropicalis NOT DETECTED NOT DETECTED Final    Comment: Performed at [facility], [address] [place], [place] [address]  CULTURE, BLOOD (ROUTINE X 2) w Reflex to ID Panel      Status: None   Collection Time: 09/21/18  1:00 PM   Specimen: BLOOD  Result Value Ref Range Status   Specimen Description BLOOD LEFT ANTECUBITAL  Final   Special Requests   Final    BOTTLES DRAWN AEROBIC AND ANAEROBIC Blood Culture adequate volume   Culture   Final    NO GROWTH 5 DAYS Performed at [facility], [address] [place], [place] [address]    Report Status 09/26/2018 FINAL  Final  CULTURE, BLOOD (ROUTINE X 2) w Reflex to ID Panel     Status: None   Collection Time: 09/21/18  1:00 PM   Specimen: BLOOD  Result Value Ref Range Status   Specimen Description BLOOD BLOOD RIGHT HAND  Final   Special Requests   Final    BOTTLES DRAWN AEROBIC ONLY Blood Culture results may not be optimal due to an inadequate volume of blood received in culture bottles   Culture   Final    NO GROWTH 5 DAYS Performed at [facility], [address], [place],  [address]    Report Status 09/26/2018 FINAL  Final   Studies/Results: No results found. Medications:  Scheduled Meds: . feeding supplement (ENSURE ENLIVE)  237 mL Oral TID BM  . multivitamin with minerals  1 tablet Oral Daily  . neomycin-bacitracin-polymyxin   Topical Daily  . pantoprazole (PROTONIX) IV  40 mg Intravenous Q12H  . vitamin C  250 mg Oral BID   Continuous Infusions: . doxycycline (VIBRAMYCIN) IV 100 mg (09/27/18 1100)  . potassium chloride 10 mEq (09/27/18 1254)   PRN Meds:.acetaminophen **OR** acetaminophen, HYDROcodone-acetaminophen, morphine injection, ondansetron **OR** ondansetron (ZOFRAN) IV, polyethylene glycol   Assessment: Elevated liver enzymes   Plan: Liver enzymes continue to improve Ferritin is elevated, but has decreased from admission.  This is likely due to acute phase reaction given his acute medical issues. Acute hepatitis panel negative  Rest of serology still pending  Improvement in liver enzymes is consistent with the elevation likely being from hypotension  on admission, and improvement with clinical status  Continue to avoid hepatotoxic drugs  Continue CMP every 1 to 2 days    LOS: 8 days   Glenn Antigua, MD 09/27/2018, 1:13 PM

## 2018-09-27 NOTE — Plan of Care (Signed)
Pt's speech is much clearer, succinct and longer sentences.  Only occasional stuttering noted.  OOB to chair with assist from PT.

## 2018-09-27 NOTE — Progress Notes (Signed)
Ch f/u with pt to finalize the HPOA AD for pt. Ch consulted w/ pt's nurse to determine if the pt presented to be lucid and oriented to understand what he was signing. Pt was able to talk and sit upright with no pain to complete the AD. Pt's daughter was contacted to inform her about the document being placed in the pt's chart and a copy with the original was left in the pt's room. Daughter was concerned about the pt completing a Living Will and if he was DNR or FULL code status. Ch assessed that the pt may need to discuss that at another time.  Goal: Ch f/u with pt's nurse to inform them of the pt's AD in the chart and that the pt was NOT ready to talk about completing a Living Will at this time.  Goal: Ch will f/u with the care team to determine if SNF placement will occur and the outcome of the swallow test.    09/26/18 1213  Clinical Encounter Type  Visited With Patient and family together;Health care provider  Visit Type Follow-up (AD completion )  Referral From Nurse  Consult/Referral To Chaplain  Spiritual Encounters  Spiritual Needs Other (Comment) (goals of care )  Stress Factors  Patient Stress Factors Health changes;Loss of control;Major life changes  Family Stress Factors Loss of control;Major life changes  Advance Directives (For Healthcare)  Does Patient Have a Medical Advance Directive? Yes  Type of Advance Directive [place] in Chart? No - copy requested  Healthcare Power of Attorney Requested and Now in Chart Copy in chart

## 2018-09-27 NOTE — Progress Notes (Signed)
PT Cancellation Note  Patient Details Name: [name] MRN: [id number] DOB: January 05, 1934   Cancelled Treatment:    Reason Eval/Treat Not Completed: Patient at procedure or test/unavailable.  Hospital staff currently in pt room for unspecified time.  Will attempt later when pt is available.  Roxanne Gates, PT, DPT  Roxanne Gates 09/27/2018, 10:48 AM

## 2018-09-27 NOTE — Consult Note (Signed)
PHARMACY CONSULT NOTE - FOLLOW UP  Pharmacy Consult for Electrolyte Monitoring and Replacement   Recent Labs: Potassium (mmol/L)  Date Value  09/27/2018 3.2 (L)   Magnesium (mg/dL)  Date Value  09/26/2018 1.9   Calcium (mg/dL)  Date Value  09/27/2018 7.6 (L)   Albumin (g/dL)  Date Value  09/26/2018 2.4 (L)   Phosphorus (mg/dL)  Date Value  09/26/2018 3.0   Sodium (mmol/L)  Date Value  09/27/2018 142   Corrected Ca: 9.4   Assessment: Patient admitted with  Hypokalemia. Pharmacy consulted for electrolyte monitoring and replacement. Patient unable to take oral replacement at this time.   Goal of Therapy:  Electrolytes WNL  Plan:  Patient receiving NaCl w/KCL 73mEq @ 75 mL/hr - now d/c'ed .  K+ 3.2. Will order KCL IV 10 mEq x 4.   Will F/U with AM labs and continue to replace electrolytes as needed.   Oswald Hillock, PharmD, BCPS Clinical Pharmacist 09/27/2018 7:38 AM

## 2018-09-27 NOTE — Consult Note (Signed)
Consultation Note Date: 09/27/2018   Patient Name: [name]  DOB: 03-03-1934  MRN: [id number]  Age / Sex: 85 y.o., male  PCP: [name], MD Referring Physician: Dustin Flock, MD  Reason for Consultation: Establishing goals of care  HPI/Patient Profile: [name]  is a 85 y.o. male with a known history of hypertension who presents today to the emergency room due to for falls today at home.  Patient reports for the past 3 days he has had dizziness and feelings of lightheadedness.  Clinical Assessment and Goals of Care: Patient is resting in bed. His daughter is at bedside. Primary MD is at bedside. They have discussed his status and failed swallow eval.   He states he lives alone and enjoys spending time with his boxer. He states he is good friends with his ex-wife. He states he believes he had a good QOL prior to this admission, and was independent and able to do his house work and drive. He uses no assistive devices. His daughter states since covid, he has not been able to leave the house an she goes to get his groceries.   We discussed his diagnosis, prognosis, and GOC.  A detailed discussion was had today regarding advanced directives.  Concepts specific to code status, artifical feeding and hydration,  and rehospitalization were discussed.  The difference between an aggressive medical intervention path and a comfort care path was discussed.  Values and goals of care important to patient and family were attempted to be elicited.  Discussed limitations of medical interventions to prolong quality of life in some situations and discussed the concept of human mortality. Scenarios and natural trajectory was discussed.  He states he had an EGD around 1 year ago, and has been having difficulty swallowing for the past few months. He states he values his independence and would not want someone  helping with his ADL's, and would not want to leave his home. He believes he would be okay with a PEG as he enjoys an occasional beer, ice cream, Dr. Malachi Bonds, but could live without them if it means life and death. He states he does not believe he would ever want ventilator support, but believes he would want CPR. Discussed how CPR and ventilators can work together and be connected in a scenario. He states he will give this all some though. He is not sure how he feels about dialysis or other interventions.        SUMMARY OF RECOMMENDATIONS   Patient is considering options. Leaning toward PEG.    Code Status/Advance Care Planning:  Full code  Psycho-social/Spiritual:   Desire for further Chaplaincy support: Following   Prognosis:   Unable to determine Depends on [place].  Discharge Planning: To Be Determined      Primary Diagnoses: Present on Admission: . Syncope   I have reviewed the medical record, interviewed the patient and family, and examined the patient. The following aspects are pertinent.  Past Medical History:  Diagnosis Date  . Arthritis   .  Cancer [facility])    Prostate Cancer  . Hypertension    Social History   Socioeconomic History  . Marital status: Widowed    Spouse name: Not on file  . Number of children: Not on file  . Years of education: Not on file  . Highest education level: Not on file  Occupational History  . Not on file  Social Needs  . Financial resource strain: Not on file  . Food insecurity    Worry: Not on file    Inability: Not on file  . Transportation needs    Medical: Not on file    Non-medical: Not on file  Tobacco Use  . Smoking status: Former Smoker    Types: Pipe    Quit date: 03/30/2002    Years since quitting: 16.5  . Smokeless tobacco: Never Used  Substance and Sexual Activity  . Alcohol use: Yes  . Drug use: Never  . Sexual activity: Not on file  Lifestyle  . Physical activity    Days per week: Not on file    Minutes per  session: Not on file  . Stress: Not on file  Relationships  . Social Herbalist on phone: Not on file    Gets together: Not on file    Attends religious service: Not on file    Active member of club or organization: Not on file    Attends meetings of clubs or organizations: Not on file    Relationship status: Not on file  Other Topics Concern  . Not on file  Social History Narrative  . Not on file   History reviewed. No pertinent family history. Scheduled Meds: . feeding supplement (ENSURE ENLIVE)  237 mL Oral TID BM  . multivitamin with minerals  1 tablet Oral Daily  . neomycin-bacitracin-polymyxin   Topical Daily  . pantoprazole (PROTONIX) IV  40 mg Intravenous Q12H  . vitamin C  250 mg Oral BID   Continuous Infusions: . doxycycline (VIBRAMYCIN) IV 100 mg (09/27/18 1100)  . potassium chloride 10 mEq (09/27/18 1110)   PRN Meds:.acetaminophen **OR** acetaminophen, HYDROcodone-acetaminophen, morphine injection, ondansetron **OR** ondansetron (ZOFRAN) IV, polyethylene glycol Medications Prior to Admission:  Prior to Admission medications   Medication Sig Start Date End Date Taking? Authorizing Provider  aspirin 325 MG EC tablet Take 325 mg by mouth every 4 (four) hours as needed for pain (2 tablets every 4 hours during the day for arthritis pain).   Yes [provider]  Calcium Carbonate-Vitamin D (CALCIUM 600+D) 600-400 MG-UNIT tablet Take 1 tablet by mouth 2 (two) times a day.   Yes [provider]  lisinopril (PRINIVIL,ZESTRIL) 40 MG tablet TAKE 1 TABLET BY MOUTH ONCE DAILY 12/31/14  Yes [provider]  Misc Natural Products (OSTEO BI-FLEX ADV JOINT SHIELD) TABS Take 2 tablets by mouth daily.   Yes [provider]  vitamin B-12 (CYANOCOBALAMIN) 500 MCG tablet Take 1 tablet by mouth daily.   Yes [provider]   No Known Allergies Review of Systems  Constitutional: Positive for fatigue.    Physical Exam HENT:      Head: Normocephalic.  Pulmonary:     Effort: Pulmonary effort is normal.  Neurological:     Mental Status: He is alert.     Comments: Oriented     Vital Signs: BP (!) 148/85 (BP Location: Left Arm)   Pulse 86   Temp 98.4 F (36.9 C) (Oral)   Resp 19   Ht 6' (  1.829 m)   Wt 60.5 kg   SpO2 98%   BMI 18.09 kg/m  Pain Scale: 0-10   Pain Score: Asleep   SpO2: SpO2: 98 % O2 Device:SpO2: 98 % O2 Flow Rate: .   IO: Intake/output summary:   Intake/Output Summary (Last 24 hours) at 09/27/2018 1115 Last data filed at 09/27/2018 2567 Gross per 24 hour  Intake 776.05 ml  Output 1700 ml  Net -923.95 ml    LBM: Last BM Date: 09/24/18 Baseline Weight: Weight: 61.2 kg Most recent weight: Weight: 60.5 kg     Palliative Assessment/Data: NPO     Time In: 10:30 Time Out: 11:50 Time Total: 1 hour 20 min Greater than 50%  of this time was spent counseling and coordinating care related to the above assessment and plan.  Signed by: Asencion Gowda, NP   Please contact Palliative Medicine Team phone at [phone] for questions and concerns.  For individual provider: See Shea Evans

## 2018-09-27 NOTE — Progress Notes (Signed)
Nutrition Follow Up Note   DOCUMENTATION CODES:   Underweight  INTERVENTION:   If PEG tube placed, recommend:  Osmolite 1.5 @ goal rate of 61m/hr- Initiate at 262mhr and increase by 104mr q 8 hours until goal rate is reached.   Recommend free water flushes 55m50m4 hours.  Regimen provides 1980kcal/day, 83g/day protein, 1456ml32m free water   Vitamin C 250mg 53mvia tube   Pt likely at high refeed risk; recommend monitor K, Mg and P labs daily   NUTRITION DIAGNOSIS:   Inadequate oral intake related to acute illness as evidenced by meal completion < 50%. -pt NPO  GOAL:   Patient will meet greater than or equal to 90% of their needs  -not met   MONITOR:   Labs, Weight trends, Skin, I & O's, Diet advancement  ASSESSMENT:   84 yea66old male with history of hypertension who has had decreased appetite for several weeks who presents after multiple falls today and syncopal episode.  RD working remotely.  Pt having dysphagia, seen by SLP but unable to be safely placed on a diet. Pt has been NPO since 6/25; pt now without adequate nutrition since admit, > 7 days. Palliative care following; pt wishes to continue with aggressive care and is considering PEG tube placement. Pt is at high refeed risk and will need to have electrolytes monitored closely if started on tube feeds. No new weight since 6/25; will request weekly weights.   Medications reviewed and include: MVI, protonix, vitamin C, doxycycline, KCl  Labs reviewed: K 3.2(L) Hgb 8.4(L), Hct 26.1(L)  Diet Order:   Diet Order            Diet NPO time specified  Diet effective now             EDUCATION NEEDS:   No education needs have been identified at this time  Skin:  Skin Assessment: Reviewed RN Assessment  Last BM:  6/27  Height:   Ht Readings from Last 1 Encounters:  09/19/18 6' (1.829 m)    Weight:   Wt Readings from Last 1 Encounters:  09/22/18 60.5 kg    Ideal Body Weight:  80.9  kg  BMI:  Body mass index is 18.09 kg/m.  Estimated Nutritional Needs:   Kcal:  1700-2000kcal/day  Protein:  85-100g/day  Fluid:  >1.5L/day  Glenn Maners Koleen DistanceD, LDN Pager #- [phone]#- [phone] Hours Pager: [phone]

## 2018-09-27 NOTE — Progress Notes (Signed)
[facility] [place] NAME: [name]    MR#:  [id number]  DATE OF BIRTH:  11-Sep-1933  SUBJECTIVE:   Denies any complaints I met with him and his daughter today  REVIEW OF SYSTEMS:   Review of Systems  Constitutional: Negative for chills, fever and weight loss.  HENT: Negative for ear discharge, ear pain and nosebleeds.   Eyes: Negative for blurred vision, pain and discharge.  Respiratory: Negative for sputum production, shortness of breath, wheezing and stridor.   Cardiovascular: Negative for chest pain, palpitations, orthopnea and PND.  Gastrointestinal: Negative for abdominal pain, diarrhea, melena, nausea and vomiting.  Genitourinary: Negative for frequency and urgency.  Musculoskeletal: Negative for back pain, falls and joint pain.  Neurological: Positive for weakness. Negative for sensory change, speech change and focal weakness.  Psychiatric/Behavioral: Negative for depression and hallucinations. The patient is not nervous/anxious.      DRUG ALLERGIES:  No Known Allergies  VITALS:  Blood pressure (!) 148/85, pulse (!) 101, temperature 98.4 F (36.9 C), temperature source Oral, resp. rate 19, height 6' (1.829 m), weight 60.5 kg, SpO2 98 %.  PHYSICAL EXAMINATION:   Physical Exam  GENERAL:  85 y.o.-year-old patient lying in the bed with no acute distress.  EYES: Pupils equal, round, reactive to light and accommodation. No scleral icterus. Extraocular muscles intact.  HEENT: Head atraumatic, normocephalic. Oropharynx and nasopharynx clear.  Bruising on his forehead NECK:  Supple, no jugular venous distention. No thyroid enlargement, no tenderness.  LUNGS: Normal breath sounds bilaterally, no wheezing, rales, rhonchi. No use of accessory muscles of respiration.  CARDIOVASCULAR: S1, S2 normal. No murmurs, rubs, or gallops.  ABDOMEN: Soft, nontender, nondistended. Bowel sounds present. No organomegaly or mass.   EXTREMITIES: No cyanosis, clubbing or edema b/l.    NEUROLOGIC: Cranial nerves II through XII are intact. No focal Motor or sensory deficits b/l.  Overall weak, mild dysarthria PSYCHIATRIC:  patient is alert and oriented x 3.  SKIN: No obvious rash, lesion, or ulcer.   LABORATORY PANEL:  CBC Recent Labs  Lab 09/27/18 0430  WBC 8.3  HGB 8.4*  HCT 26.1*  PLT 302    Chemistries  Recent Labs  Lab 09/26/18 0517 09/27/18 0430 09/27/18 0932  NA 149* 142  --   K 3.9 3.2*  --   CL 115* 108  --   CO2 25 29  --   GLUCOSE 124* 119*  --   BUN 15 12  --   CREATININE 0.87 0.71  --   CALCIUM 7.9* 7.6*  --   MG 1.9  --   --   AST 112*  --  72*  ALT 101*  --  78*  ALKPHOS 101  --  92  BILITOT 1.0  --  1.3*   Cardiac Enzymes No results for input(s): TROPONINI in the last 168 hours. RADIOLOGY:  No results found. ASSESSMENT AND PLAN:   85 year old male with history of hypertension who has had decreased appetite for several weeks who presents after multiple falls today and syncopal episode.  1. Syncope with hypotension secondary dehydration, hypokalemia -holding of aspirin secondary to melena and Subdural hematoma Physical therapy consultation --SNF pt and dter sheri agreeable CT head on admission was unremarkable -MRI brain showed left frontal cortex subdural hematoma with s/o infarct -Patient seen by neurosurgery recommended to monitor patient -Dysphasia I discussed with the patient and the daughter in detail regarding PEG tube.  Them what that entails.  Also he was seen by palliative care team regarding this.   2.  Acute kidney injury in the setting of dehydration:  -Hold nephrotoxic medications including blood pressure medications  - received IV fluids.  -Baseline creatinine 0.8 in October 2018 -Renal function normal -Had urinary retention requiring Foley  3.  Urinary retention Foley in place  4.  Hypernatremia due to free water deficit we will give D5W monitor  sodium   5  Left Subdural Hematoma  S/p fall at home (delayed effects seen on MRI brain) with leftfrontal cortex CVA/infarct -hold ASA -seen by Dr Cari Caraway -Repeat CT head 09/20/18-- stable hematoma -MBS shows patient with severe dysphasia  6  Elevated liver function,LDH low platelet count and anemia - ultrasound abdomen-- no hepatosplenomegaly -seen by Dr Tasia Catchings -I have asked GI to see as well   7.  Fever: etio unclear ?viral -BC 1/4 GPR --bacillus species -d/c vanc, cefepime Chest x-ray and UA are unremarkable. -UC neg -COVID-19 on admission neg -tmax 103--now afebrile -ID consult appreciated USG abdomen--no acute abnormality -cont doxy for possible tick bite illness  8.  Hypertension Continue  9 HYpokalemia: Replace  10. History of prostate cancer -pelvic x-rays  Shows diffuse osteopenia  11.Acute on Chronic anemia with melena and left subdural hematoma -GI saw pt--colonoscopy as out pt (pt preference) -EGD 11/2017--Barretts esophagus -Hemoglobin stable   CODE STATUS: full  DVT Prophylaxis: SCD  TOTAL TIME TAKING CARE OF THIS PATIENT: *30* minutes.  >50% time spent on counselling and coordination of care  POSSIBLE D/C IN 2-3 DAYS, DEPENDING ON CLINICAL CONDITION.  Note: This dictation was prepared with Dragon dictation along with smaller phrase technology. Any transcriptional errors that result from this process are unintentional.  Dustin Flock M.D on 09/27/2018 at 2:01 PM  Between 7am to 6pm - Pager - [phone]  After 6pm go to [email or web address] - password [email or web address] [facility]  Office  [phone]  CC: Primary care physician; [name], MDPatient ID: Mindi Junker, male   DOB: 1933-09-17, 85 y.o.   MRN: [id number]

## 2018-09-27 NOTE — Progress Notes (Signed)
Physical Therapy Treatment Patient Details Name: [name] MRN: [id number] DOB: 05-19-1933 Today's Date: 09/27/2018    History of Present Illness Pt admitted for syncope with multiple falls at home. Complains of hip pain upon arrival to hospital. History includes HTN and prostate cancer.  Noted multiple abrasions to body     PT Comments    Pt more alert today and willing to attempt a transfer.  Pt's daughter in room to assist.  Pt required +2 mod-max A for bed mobility and STS.  He became very weak and unsteady when standing and sat down abruptly on bed with very poor sitting balance.  Pt also tachycardic but recovered within 1 min.  Pt able to perform squat pivot transfer mod A for safety and sit up in recliner to discuss discharge plan with PT and daughter.  Pt will continue to require PT for strength, tolerance to activity and safe functional mobility.  He is appropriate for SNF placement, but should pt choose to go home with home health,  Family will need education concerning safe transfers and mobility.   Follow Up Recommendations  SNF     Equipment Recommendations  Other (comment)    Recommendations for Other Services       Precautions / Restrictions Precautions Precautions: Fall Precaution Comments: High Fall Risk, multiple fall history    Mobility  Bed Mobility Overal bed mobility: Needs Assistance Bed Mobility: Supine to Sit;Sit to Supine     Supine to sit: Max assist Sit to supine: Mod assist   General bed mobility comments: Pt required hand held assist and VC's to bring LE's over EOB.  Assistance to return to bed after attempting STS and HR elevating to 130 range.  Transfers Overall transfer level: Needs assistance Equipment used: Rolling walker (2 wheeled) Transfers: Sit to/from [employer] Sit to Stand: Min assist   Squat pivot transfers: Mod assist     General transfer comment: PT and pt's daughter provided assistance for stability  and VC's for use of RW.  Pt very unsteady and WB on lateral feet.  Pt became very tremulous and sat back down on bed abruptly, not responding to PT or his daughter but with eyes remaining open.  RN called and pt eventually responded to her.  Pt tachycardic when standing but recovered to 100-101 BPM in sitting.  Squat pivot transfer performed for safety and pt was able to assist PT with this transfer without difficulty.  Ambulation/Gait                 Stairs             Wheelchair Mobility    Modified Rankin (Stroke Patients Only)       Balance Overall balance assessment: Needs assistance Sitting-balance support: Bilateral upper extremity supported Sitting balance-Leahy Scale: Poor   Postural control: Left lateral lean Standing balance support: Bilateral upper extremity supported Standing balance-Leahy Scale: Poor                              Cognition Arousal/Alertness: Awake/alert Behavior During Therapy: Restless Overall Cognitive Status: Difficult to assess                                 General Comments: Pt presented with intermittent responsiveness but was able to answer questions directly and orientation to self and location.  Exercises Other Exercises Other Exercises: Time to discuss and educate regarding placement following discharge. x5 min    General Comments        Pertinent Vitals/Pain Pain Assessment: No/denies pain    Home Living                      Prior Function            PT Goals (current goals can now be found in the care plan section) Acute Rehab PT Goals Patient Stated Goal: to go home PT Goal Formulation: With patient Time For Goal Achievement: 10/04/18 Potential to Achieve Goals: Fair Progress towards PT goals: Progressing toward goals    Frequency    Min 2X/week      PT Plan Current plan remains appropriate    Co-evaluation              AM-PAC PT "6 Clicks"  Mobility   Outcome Measure  Help needed turning from your back to your side while in a flat bed without using bedrails?: A Lot Help needed moving from lying on your back to sitting on the side of a flat bed without using bedrails?: A Lot Help needed moving to and from a bed to a chair (including a wheelchair)?: A Lot Help needed standing up from a chair using your arms (e.g., wheelchair or bedside chair)?: A Lot Help needed to walk in hospital room?: Total Help needed climbing 3-5 steps with a railing? : Total 6 Click Score: 10    End of Session Equipment Utilized During Treatment: Gait belt Activity Tolerance: Patient limited by fatigue Patient left: in chair;with call bell/phone within reach;with nursing/sitter in room;with family/visitor present Nurse Communication: Mobility status PT Visit Diagnosis: Unsteadiness on feet (R26.81);Muscle weakness (generalized) (M62.81);History of falling (Z91.81);Difficulty in walking, not elsewhere classified (R26.2);Dizziness and giddiness (R42)     Time: 2010-0712 PT Time Calculation (min) (ACUTE ONLY): 29 min  Charges:  $Therapeutic Activity: 23-37 mins                     Roxanne Gates, PT, DPT    Roxanne Gates 09/27/2018, 1:15 PM

## 2018-09-28 ENCOUNTER — Ambulatory Visit: Payer: Self-pay | Admitting: Nurse Practitioner

## 2018-09-28 ENCOUNTER — Inpatient Hospital Stay: Payer: Medicare Other

## 2018-09-28 DIAGNOSIS — R1311 Dysphagia, oral phase: Secondary | ICD-10-CM

## 2018-09-28 DIAGNOSIS — R634 Abnormal weight loss: Secondary | ICD-10-CM

## 2018-09-28 DIAGNOSIS — D649 Anemia, unspecified: Secondary | ICD-10-CM

## 2018-09-28 LAB — CBC WITH DIFFERENTIAL/PLATELET
Abs Immature Granulocytes: 0.2 10*3/uL — ABNORMAL HIGH (ref 0.00–0.07)
Basophils Absolute: 0 10*3/uL (ref 0.0–0.1)
Basophils Relative: 1 %
Eosinophils Absolute: 0.1 10*3/uL (ref 0.0–0.5)
Eosinophils Relative: 1 %
HCT: 26.3 % — ABNORMAL LOW (ref 39.0–52.0)
Hemoglobin: 8.4 g/dL — ABNORMAL LOW (ref 13.0–17.0)
Immature Granulocytes: 3 %
Lymphocytes Relative: 29 %
Lymphs Abs: 2.3 10*3/uL (ref 0.7–4.0)
MCH: 29.2 pg (ref 26.0–34.0)
MCHC: 31.9 g/dL (ref 30.0–36.0)
MCV: 91.3 fL (ref 80.0–100.0)
Monocytes Absolute: 1 10*3/uL (ref 0.1–1.0)
Monocytes Relative: 13 %
Neutro Abs: 4.1 10*3/uL (ref 1.7–7.7)
Neutrophils Relative %: 53 %
Platelets: 320 10*3/uL (ref 150–400)
RBC: 2.88 MIL/uL — ABNORMAL LOW (ref 4.22–5.81)
RDW: 14.8 % (ref 11.5–15.5)
WBC: 7.8 10*3/uL (ref 4.0–10.5)
nRBC: 0 % (ref 0.0–0.2)

## 2018-09-28 LAB — PSA: Prostatic Specific Antigen: 12.82 ng/mL — ABNORMAL HIGH (ref 0.00–4.00)

## 2018-09-28 LAB — BASIC METABOLIC PANEL
Anion gap: 5 (ref 5–15)
BUN: 13 mg/dL (ref 8–23)
CO2: 25 mmol/L (ref 22–32)
Calcium: 7.7 mg/dL — ABNORMAL LOW (ref 8.9–10.3)
Chloride: 109 mmol/L (ref 98–111)
Creatinine, Ser: 0.62 mg/dL (ref 0.61–1.24)
GFR calc Af Amer: >60 mL/min (ref >60)
GFR calc non Af Amer: >60 mL/min (ref >60)
Glucose, Bld: 112 mg/dL — ABNORMAL HIGH (ref 70–99)
Potassium: 3.7 mmol/L (ref 3.5–5.1)
Sodium: 139 mmol/L (ref 135–145)

## 2018-09-28 LAB — ANTI-SMOOTH MUSCLE ANTIBODY, IGG: F-Actin IgG: 19 Units (ref 0–19)

## 2018-09-28 LAB — MISC LABCORP TEST (SEND OUT): Labcorp test code: 138412

## 2018-09-28 LAB — RETIC PANEL
Immature Retic Fract: 32.9 % — ABNORMAL HIGH (ref 2.3–15.9)
RBC.: 2.86 MIL/uL — ABNORMAL LOW (ref 4.22–5.81)
Retic Count, Absolute: 117 10*3/uL (ref 19.0–186.0)
Retic Ct Pct: 4.1 % — ABNORMAL HIGH (ref 0.4–3.1)
Reticulocyte Hemoglobin: 29.3 pg (ref >27.9)

## 2018-09-28 LAB — HEPATITIS B DNA, ULTRAQUANTITATIVE, PCR
HBV DNA SERPL PCR-ACNC: NOT DETECTED IU/mL
HBV DNA SERPL PCR-LOG IU: UNDETERMINED log10 IU/mL

## 2018-09-28 LAB — ALPHA-1 ANTITRYPSIN PHENOTYPE: A-1 Antitrypsin, Ser: 182 mg/dL (ref 101–187)

## 2018-09-28 MED ORDER — DEXTROSE 5 % IV SOLN
INTRAVENOUS | Status: AC
  Administered 2018-09-28 – 2018-09-29 (×2): via INTRAVENOUS

## 2018-09-28 NOTE — Progress Notes (Signed)
ID Pt says he is feeling better He says he was swallowing okay before his admission and was eating  so not sure why he has swallowing problems now??   Patient Vitals for the past 24 hrs:  BP Temp Temp src Pulse Resp SpO2  09/28/18 0812 138/89 98.9 F (37.2 C) Oral 90 19 97 %  09/28/18 0324 126/90 99.3 F (37.4 C) Oral 87 18 98 %  09/27/18 2042 130/78 98.3 F (36.8 C) Oral 86 18 97 %   Awake and alert Left sided ecchymosis improving Chest b/l air entry   CBC Latest Ref Rng & Units 09/28/2018 09/27/2018 09/26/2018  WBC 4.0 - 10.5 K/uL 7.8 8.3 8.8  Hemoglobin 13.0 - 17.0 g/dL 8.4(L) 8.4(L) 9.3(L)  Hematocrit 39.0 - 52.0 % 26.3(L) 26.1(L) 29.5(L)  Platelets 150 - 400 K/uL 320 302 PLATELET CLUMPS NOTED ON SMEAR, UNABLE TO ESTIMATE    CMP Latest Ref Rng & Units 09/28/2018 09/27/2018 09/26/2018  Glucose 70 - 99 mg/dL 112(H) 119(H) 124(H)  BUN 8 - 23 mg/dL 13 12 15  Creatinine 0.61 - 1.24 mg/dL 0.62 0.71 0.87  Sodium 135 - 145 mmol/L 139 142 149(H)  Potassium 3.5 - 5.1 mmol/L 3.7 3.2(L) 3.9  Chloride 98 - 111 mmol/L 109 108 115(H)  CO2 22 - 32 mmol/L 25 29 25  Calcium 8.9 - 10.3 mg/dL 7.7(L) 7.6(L) 7.9(L)  Total Protein 6.5 - 8.1 g/dL - 4.7(L) 4.6(L)  Total Bilirubin 0.3 - 1.2 mg/dL - 1.3(H) 1.0  Alkaline Phos 38 - 126 U/L - 92 101  AST 15 - 41 U/L - 72(H) 112(H)  ALT 0 - 44 U/L - 78(H) 101(H)    Impression/Recommendation  Pt admitted with syncope, fall, trauma   Syncope could be from postural hypotension .acute anemia, need to r/o cardiac cause-echo done but result pending  Fever with pancytopenia- resolved with h/o tick exposure,  Ehrlichia/RMSFis the working diagnosis. Fever  Resolved promptly after starting DOXY. . He will get DOXY until 09/29/18. Once able to swallow can change to PO   Abnormal LFTS, high LDH, high ferritin, all have improved  so very likely due to  infection-  RMSF or ehrlichia- RMSF IgG is 1:64, we need to repeat another serology in 4 weeks as it  should increase if this is an acute infection due to Rickettsia. Ehrlichia PCR still pending EBV- antibodies positive -indicates prior exposure not an acute infection  Anemia : Multiple myeloma screen negative MDS in the D.D., Stool occult positive- As per heme onc bone marrow biopsy if HB trending down as OP   Gram positive rod in 1 of 4 blood culture-bacillus acontaminant-no need to treat Myeloma is being questioned because of plasmacytoid cells- Panel sent  H/o prostate ca- had cryoablation-   According to patient He  did not have trouble swallowing before admission.,so could this be bulbar involvement, ? CNS? Paraneoplastic   Weight loss-   Discussed the management with Dr.PAtel ID will sign off- call if needed 

## 2018-09-28 NOTE — Progress Notes (Signed)
[facility] [place] NAME: [name]    MR#:  [id number]  DATE OF BIRTH:  1933-12-13  SUBJECTIVE:   Patient agreeable to have a PEG tube placed  REVIEW OF SYSTEMS:   Review of Systems  Constitutional: Negative for chills, fever and weight loss.  HENT: Negative for ear discharge, ear pain and nosebleeds.   Eyes: Negative for blurred vision, pain and discharge.  Respiratory: Negative for sputum production, shortness of breath, wheezing and stridor.   Cardiovascular: Negative for chest pain, palpitations, orthopnea and PND.  Gastrointestinal: Negative for abdominal pain, diarrhea, melena, nausea and vomiting.  Genitourinary: Negative for frequency and urgency.  Musculoskeletal: Negative for back pain, falls and joint pain.  Neurological: Positive for weakness. Negative for sensory change, speech change and focal weakness.  Psychiatric/Behavioral: Negative for depression and hallucinations. The patient is not nervous/anxious.      DRUG ALLERGIES:  No Known Allergies  VITALS:  Blood pressure 138/89, pulse 90, temperature 98.9 F (37.2 C), temperature source Oral, resp. rate 19, height 6' (1.829 m), weight 60.5 kg, SpO2 97 %.  PHYSICAL EXAMINATION:   Physical Exam  GENERAL:  85 y.o.-year-old patient lying in the bed with no acute distress.  EYES: Pupils equal, round, reactive to light and accommodation. No scleral icterus. Extraocular muscles intact.  HEENT: Head atraumatic, normocephalic. Oropharynx and nasopharynx clear.  Bruising on his forehead NECK:  Supple, no jugular venous distention. No thyroid enlargement, no tenderness.  LUNGS: Normal breath sounds bilaterally, no wheezing, rales, rhonchi. No use of accessory muscles of respiration.  CARDIOVASCULAR: S1, S2 normal. No murmurs, rubs, or gallops.  ABDOMEN: Soft, nontender, nondistended. Bowel sounds present. No organomegaly or mass.  EXTREMITIES: No cyanosis,  clubbing or edema b/l.    NEUROLOGIC: Cranial nerves II through XII are intact. No focal Motor or sensory deficits b/l.  Overall weak, mild dysarthria PSYCHIATRIC:  patient is alert and oriented x 3.  SKIN: No obvious rash, lesion, or ulcer.   LABORATORY PANEL:  CBC Recent Labs  Lab 09/28/18 0329  WBC 7.8  HGB 8.4*  HCT 26.3*  PLT 320    Chemistries  Recent Labs  Lab 09/26/18 0517  09/27/18 0932 09/28/18 0329  NA 149*   < >  --  139  K 3.9   < >  --  3.7  CL 115*   < >  --  109  CO2 25   < >  --  25  GLUCOSE 124*   < >  --  112*  BUN 15   < >  --  13  CREATININE 0.87   < >  --  0.62  CALCIUM 7.9*   < >  --  7.7*  MG 1.9  --   --   --   AST 112*  --  72*  --   ALT 101*  --  78*  --   ALKPHOS 101  --  92  --   BILITOT 1.0  --  1.3*  --    < > = values in this interval not displayed.   Cardiac Enzymes No results for input(s): TROPONINI in the last 168 hours. RADIOLOGY:  No results found. ASSESSMENT AND PLAN:   85 year old male with history of hypertension who has had decreased appetite for several weeks who presents after multiple falls today and syncopal episode.  1. Syncope with hypotension secondary dehydration, hypokalemia -holding of aspirin secondary to melena and Subdural hematoma  Physical therapy consultation --SNF pt and dter sheri agreeable CT head on admission was unremarkable -MRI brain showed left frontal cortex subdural hematoma with s/o infarct -Patient seen by neurosurgery recommended to monitor patient -Dysphasia--patient and daughter want to proceed with the PEG tube, discussed with GI they state that patient is too high risk and recommends PEG tube by interventional radiology  2.  Acute kidney injury in the setting of dehydration:  -Hold nephrotoxic medications including blood pressure medications  - received IV fluids.  -Baseline creatinine 0.8 in October 2018 -Renal function normal -Had urinary retention requiring Foley  3.  Urinary  retention continue Foley  4.  Hypernatremia due to free water deficit we will give D5W monitor sodium   5  Left Subdural Hematoma  S/p fall at home (delayed effects seen on MRI brain) with leftfrontal cortex CVA/infarct -hold ASA -seen by Dr Cari Caraway -Repeat CT head 09/20/18-- stable hematoma -MBS shows patient with severe dysphasia  6  Elevated liver function,LDH low platelet count and anemia - ultrasound abdomen-- no hepatosplenomegaly -seen by Dr Tasia Catchings -I have asked GI to see as well   7.  Fever: etio unclear ?viral -BC 1/4 GPR --bacillus species -d/c vanc, cefepime Chest x-ray and UA are unremarkable. -UC neg -COVID-19 on admission neg -tmax 103--now afebrile -ID consult appreciated USG abdomen--no acute abnormality -cont doxy for possible tick bite illness stop date tomorrow  8.  Hypertension Continue  9 HYpokalemia: Replace  10. History of prostate cancer -pelvic x-rays  Shows diffuse osteopenia  11.Acute on Chronic anemia with melena and left subdural hematoma -GI saw pt--colonoscopy as out pt (pt preference) -EGD 11/2017--Barretts esophagus -Hemoglobin stable   CODE STATUS: full  DVT Prophylaxis: SCD  TOTAL TIME TAKING CARE OF THIS PATIENT: *30* minutes.  >50% time spent on counselling and coordination of care  POSSIBLE D/C IN 2-3 DAYS, DEPENDING ON CLINICAL CONDITION.  Note: This dictation was prepared with Dragon dictation along with smaller phrase technology. Any transcriptional errors that result from this process are unintentional.  Dustin Flock M.D on 09/28/2018 at 1:56 PM  Between 7am to 6pm - Pager - [phone]  After 6pm go to [email or web address] - password [email or web address] [facility]  Office  [phone]  CC: Primary care physician; [name], MDPatient ID: [name], male   DOB: April 22, 1933, 85 y.o.   MRN: [id number]

## 2018-09-28 NOTE — NC FL2 (Signed)
[place] LEVEL OF CARE SCREENING TOOL     IDENTIFICATION  Patient Name: [name] Birthdate: 12/21/1933 Sex: male Admission Date (Current Location): 09/19/2018  [place] and [place] Number:  Engineering geologist and Address:  [facility], [address], [place], [place] [address]      Provider Number: [id number]  Attending Physician Name and Address:  Dustin Flock, MD  Relative Name and Phone Number:  Bertell Maria Daughter [phone]    Current Level of Care: Hospital Recommended Level of Care: [place] Prior Approval Number:    Date Approved/Denied:   PASRR Number: [id number]  Discharge Plan: SNF    Current Diagnoses: Patient Active Problem List   Diagnosis Date Noted  . Anemia   . Thrombocytopenia ([place])   . Blood in stool   . Elevated transaminase level   . Syncope 09/19/2018    Orientation RESPIRATION BLADDER Height & Weight     Self, Time, Situation, Place  Normal Incontinent Weight: 60.5 kg Height:  6' (182.9 cm)  BEHAVIORAL SYMPTOMS/MOOD NEUROLOGICAL BOWEL NUTRITION STATUS      Continent Diet(currently NPO)  AMBULATORY STATUS COMMUNICATION OF NEEDS Skin   Limited Assist Verbally Normal                       Personal Care Assistance Level of Assistance  Bathing, Feeding, Dressing Bathing Assistance: Limited assistance Feeding assistance: Independent Dressing Assistance: Limited assistance     Functional Limitations Info  Sight, Hearing, Speech Sight Info: Adequate Hearing Info: Adequate Speech Info: Adequate    SPECIAL CARE FACTORS FREQUENCY  PT (By licensed PT)     PT Frequency: 5 x week              Contractures Contractures Info: Not present    Additional Factors Info  Code Status, Allergies Code Status Info: full Allergies Info: NKA           Current Medications (09/28/2018):  This is the current hospital active medication list Current  Facility-Administered Medications  Medication Dose Route Frequency Provider Last Rate Last Dose  . dextrose 5 % solution   Intravenous Continuous Harrie Foreman, MD 75 mL/hr at 09/28/18 0617    . doxycycline (VIBRAMYCIN) 100 mg in sodium chloride 0.9 % 250 mL IVPB  100 mg Intravenous Q12H Oswald Hillock, RPH 125 mL/hr at 09/28/18 0924 100 mg at 09/28/18 0924  . feeding supplement (ENSURE ENLIVE) (ENSURE ENLIVE) liquid 237 mL  237 mL Oral TID BM Fritzi Mandes, MD   Stopped at 09/22/18 2010  . HYDROcodone-acetaminophen (NORCO/VICODIN) 5-325 MG per tablet 1 tablet  1 tablet Oral Q6H PRN Fritzi Mandes, MD      . morphine 2 MG/ML injection 1 mg  1 mg Intravenous Q4H PRN Dustin Flock, MD   1 mg at 09/25/18 5916  . multivitamin with minerals tablet 1 tablet  1 tablet Oral Daily Fritzi Mandes, MD   Stopped at 09/26/18 1000  . neomycin-bacitracin-polymyxin (NEOSPORIN) ointment packet   Topical Daily Dustin Flock, MD   1 application at 38/46/65 0919  . ondansetron (ZOFRAN) tablet 4 mg  4 mg Oral Q6H PRN Bettey Costa, MD       Or  . ondansetron (ZOFRAN) injection 4 mg  4 mg Intravenous Q6H PRN Mody, Sital, MD      . pantoprazole (PROTONIX) injection 40 mg  40 mg Intravenous Q12H Fritzi Mandes, MD   40 mg at 09/28/18 0918  .  polyethylene glycol (MIRALAX / GLYCOLAX) packet 17 g  17 g Oral Daily PRN Mody, Sital, MD      . vitamin C (ASCORBIC ACID) tablet 250 mg  250 mg Oral BID Fritzi Mandes, MD   Stopped at 09/22/18 2011     Discharge Medications: Please see discharge summary for a list of discharge medications.  Relevant Imaging Results:  Relevant Lab Results:   Additional Information- Patient will have a PEG Tube    Katrina Stack, RN

## 2018-09-28 NOTE — Plan of Care (Signed)
  Problem: Coping: Goal: Level of anxiety will decrease Outcome: Progressing   Problem: Elimination: Goal: Will not experience complications related to bowel motility Outcome: Progressing Note: BM this shift   Problem: Pain Managment: Goal: General experience of comfort will improve Outcome: Progressing Note: No complaints of pain this shift   Problem: Safety: Goal: Ability to remain free from injury will improve Outcome: Progressing Note: Low bed in place   Problem: Skin Integrity: Goal: Risk for impaired skin integrity will decrease Outcome: Progressing

## 2018-09-28 NOTE — Progress Notes (Signed)
Glenn Antigua, MD [address] [place], [place], [place], [address] [address], [place], [place], [place], [address] Phone: [phone]  Fax: [phone]   Subjective:  Patient resting in bed comfortably.  No complaints  Objective: Exam: Vital signs in last 24 hours: Vitals:   09/27/18 1622 09/27/18 2042 09/28/18 0324 09/28/18 0812  BP: (!) 138/93 130/78 126/90 138/89  Pulse: 88 86 87 90  Resp: 18 18 18 19   Temp: (!) 97.5 F (36.4 C) 98.3 F (36.8 C) 99.3 F (37.4 C) 98.9 F (37.2 C)  TempSrc: Oral Oral Oral Oral  SpO2: 99% 97% 98% 97%  Weight:      Height:       Weight change:   Intake/Output Summary (Last 24 hours) at 09/28/2018 1707 Last data filed at 09/28/2018 0940 Gross per 24 hour  Intake 1193.56 ml  Output 950 ml  Net 243.56 ml    General: No acute distress, AAO x3 Abd: Soft, NT/ND, No HSM Skin: Warm, no rashes Neck: Supple, Trachea midline   Lab Results: Lab Results  Component Value Date   WBC 7.8 09/28/2018   HGB 8.4 (L) 09/28/2018   HCT 26.3 (L) 09/28/2018   MCV 91.3 09/28/2018   PLT 320 09/28/2018   Micro Results: Recent Results (from the past 240 hour(s))  Blood culture (routine x 2)     Status: None   Collection Time: 09/19/18  3:35 PM   Specimen: BLOOD  Result Value Ref Range Status   Specimen Description BLOOD RIGHT ANTECUBITAL  Final   Special Requests   Final    BOTTLES DRAWN AEROBIC AND ANAEROBIC Blood Culture adequate volume   Culture   Final    NO GROWTH 5 DAYS Performed at [facility], [address] [place], [place] [address]    Report Status 09/24/2018 FINAL  Final  Urine Culture     Status: None   Collection Time: 09/19/18  3:35 PM   Specimen: Urine, Random  Result Value Ref Range Status   Specimen Description   Final    URINE, RANDOM Performed at [facility], [address], [place], [place] [address]    Special Requests   Final    Normal Performed at [facility], [address] [place], [place] [address]    Culture   Final    NO GROWTH Performed at [facility], [place] [address] [place], [place] [address]    Report Status 09/20/2018 FINAL  Final  Blood culture (routine x 2)     Status: Abnormal   Collection Time: 09/19/18  3:36 PM   Specimen: BLOOD  Result Value Ref Range Status   Specimen Description   Final    BLOOD LEFT ANTECUBITAL Performed at [facility], [address], [place], [place] [address]    Special Requests   Final    BOTTLES DRAWN AEROBIC AND ANAEROBIC Blood Culture adequate volume Performed at [facility], [address] [place], [place] [address]    Culture  Setup Time   Final    AEROBIC BOTTLE ONLY GRAM POSITIVE RODS CRITICAL RESULT CALLED TO, READ BACK BY AND VERIFIED WITH: SHEEMA HALLAJI AT 1694 ON 09/20/2018 JJB    Culture (A)  Final    BACILLUS SPECIES NOT ANTHRACIS THE SIGNIFICANCE OF ISOLATING THIS ORGANISM FROM A SINGLE SET OF BLOOD CULTURES WHEN MULTIPLE SETS ARE DRAWN IS UNCERTAIN. PLEASE NOTIFY THE MICROBIOLOGY DEPARTMENT WITHIN ONE WEEK IF SPECIATION AND SENSITIVITIES ARE REQUIRED.  Performed at [facility], [place] [address], [place], [place] [address]    Report Status 09/22/2018 FINAL  Final  SARS Coronavirus 2 (CEPHEID- Performed in [place] hospital lab), Hosp Order     Status: None   Collection Time: 09/19/18  3:36 PM   Specimen: Nasopharyngeal Swab  Result Value Ref Range Status   SARS Coronavirus 2 NEGATIVE NEGATIVE Final    Comment: (NOTE) If result is NEGATIVE SARS-CoV-2 target nucleic acids are NOT DETECTED. The SARS-CoV-2 RNA is generally detectable in upper and lower  respiratory specimens during the acute phase of infection. The lowest  concentration of SARS-CoV-2 viral copies this assay can detect is 250  copies / mL. A negative result does not preclude SARS-CoV-2 infection  and should not be used as the sole basis for treatment or other  patient management  decisions.  A negative result may occur with  improper specimen collection / handling, submission of specimen other  than nasopharyngeal swab, presence of viral mutation(s) within the  areas targeted by this assay, and inadequate number of viral copies  (<250 copies / mL). A negative result must be combined with clinical  observations, patient history, and epidemiological information. If result is POSITIVE SARS-CoV-2 target nucleic acids are DETECTED. The SARS-CoV-2 RNA is generally detectable in upper and lower  respiratory specimens dur ing the acute phase of infection.  Positive  results are indicative of active infection with SARS-CoV-2.  Clinical  correlation with patient history and other diagnostic information is  necessary to determine patient infection status.  Positive results do  not rule out bacterial infection or co-infection with other viruses. If result is PRESUMPTIVE POSTIVE SARS-CoV-2 nucleic acids MAY BE PRESENT.   A presumptive positive result was obtained on the submitted specimen  and confirmed on repeat testing.  While 2019 novel coronavirus  (SARS-CoV-2) nucleic acids may be present in the submitted sample  additional confirmatory testing may be necessary for epidemiological  and / or clinical management purposes  to differentiate between  SARS-CoV-2 and other Sarbecovirus currently known to infect humans.  If clinically indicated additional testing with an alternate test  methodology [phone]) is advised. The SARS-CoV-2 RNA is generally  detectable in upper and lower respiratory sp ecimens during the acute  phase of infection. The expected result is Negative. Fact Sheet for Patients:  StrictlyIdeas.no Fact Sheet for Healthcare Providers: BankingDealers.co.za This test is not yet approved or cleared by the [place] FDA and has been authorized for detection and/or diagnosis of SARS-CoV-2 by FDA under an  Emergency Use Authorization (EUA).  This EUA will remain in effect (meaning this test can be used) for the duration of the COVID-19 declaration under Section 564(b)(1) of the Act, 21 U.S.C. section 360bbb-3(b)(1), unless the authorization is terminated or revoked sooner. Performed at [facility], [address] [place], [place] [address]   Blood Culture ID Panel (Reflexed)     Status: None   Collection Time: 09/19/18  3:36 PM  Result Value Ref Range Status   Enterococcus species NOT DETECTED NOT DETECTED Final   Listeria monocytogenes NOT DETECTED NOT DETECTED Final   Staphylococcus species NOT DETECTED NOT DETECTED Final   Staphylococcus aureus (BCID) NOT DETECTED NOT DETECTED Final   Streptococcus species NOT DETECTED NOT DETECTED Final   Streptococcus agalactiae NOT DETECTED NOT DETECTED Final   Streptococcus pneumoniae NOT DETECTED NOT DETECTED Final   Streptococcus pyogenes NOT DETECTED NOT DETECTED Final   Acinetobacter baumannii NOT DETECTED NOT DETECTED  Final   Enterobacteriaceae species NOT DETECTED NOT DETECTED Final   Enterobacter cloacae complex NOT DETECTED NOT DETECTED Final   Escherichia coli NOT DETECTED NOT DETECTED Final   Klebsiella oxytoca NOT DETECTED NOT DETECTED Final   Klebsiella pneumoniae NOT DETECTED NOT DETECTED Final   Proteus species NOT DETECTED NOT DETECTED Final   Serratia marcescens NOT DETECTED NOT DETECTED Final   Haemophilus influenzae NOT DETECTED NOT DETECTED Final   Neisseria meningitidis NOT DETECTED NOT DETECTED Final   Pseudomonas aeruginosa NOT DETECTED NOT DETECTED Final   Candida albicans NOT DETECTED NOT DETECTED Final   Candida glabrata NOT DETECTED NOT DETECTED Final   Candida krusei NOT DETECTED NOT DETECTED Final   Candida parapsilosis NOT DETECTED NOT DETECTED Final   Candida tropicalis NOT DETECTED NOT DETECTED Final    Comment: Performed at [facility], [address] [place], [place] [address]  CULTURE,  BLOOD (ROUTINE X 2) w Reflex to ID Panel     Status: None   Collection Time: 09/21/18  1:00 PM   Specimen: BLOOD  Result Value Ref Range Status   Specimen Description BLOOD LEFT ANTECUBITAL  Final   Special Requests   Final    BOTTLES DRAWN AEROBIC AND ANAEROBIC Blood Culture adequate volume   Culture   Final    NO GROWTH 5 DAYS Performed at [facility], [address] [place], [place] [address]    Report Status 09/26/2018 FINAL  Final  CULTURE, BLOOD (ROUTINE X 2) w Reflex to ID Panel     Status: None   Collection Time: 09/21/18  1:00 PM   Specimen: BLOOD  Result Value Ref Range Status   Specimen Description BLOOD BLOOD RIGHT HAND  Final   Special Requests   Final    BOTTLES DRAWN AEROBIC ONLY Blood Culture results may not be optimal due to an inadequate volume of blood received in culture bottles   Culture   Final    NO GROWTH 5 DAYS Performed at [facility], [address], [place], [place] [address]    Report Status 09/26/2018 FINAL  Final   Studies/Results: Ct Abdomen Wo Contrast  Result Date: 09/28/2018 CLINICAL DATA:  Dysphagia. Evaluate anatomy prior to potential percutaneous gastrostomy tube placement EXAM: CT ABDOMEN WITHOUT CONTRAST TECHNIQUE: Multidetector CT imaging of the abdomen was performed following the standard protocol without IV contrast. COMPARISON:  CT abdomen pelvis-01/20/2017; abdominal ultrasound-09/20/2018 FINDINGS: Lack of intravenous contrast limits the ability to evaluate solid abdominal organs. Lower chest: Limited visualization of the lower thorax demonstrates small bilateral effusions with associated bibasilar consolidative opacities. Normal heart size. Coronary artery calcifications. Trace amount of pericardial fluid, presumably physiologic. There is diffuse decreased attenuation of the intra cardiac blood pool suggestive of anemia. Hepatobiliary: Normal hepatic contour. Scattered punctate granuloma are seen within the liver. Normal  noncontrast appearance of the gallbladder given degree distention. No radiopaque gallstones. No ascites. Pancreas: Normal noncontrast appearance of the pancreas. Spleen: Punctate granuloma within otherwise normal-appearing spleen. Adrenals/Urinary Tract: Note is made of 3 nonobstructing left-sided renal stones with dominant nonobstructing stone within the interpolar aspect of the left kidney measuring 1.1 x 0.7 cm (coronal image 46, series 5). Note is made of a punctate (approximately 3 mm) nonobstructing stone within the inferior pole of the right kidney (coronal image 9, series 5). Previously characterized bilateral renal cysts are grossly unchanged with dominant partially exophytic cyst arising from the inferior pole of the right kidney measuring approximately 8.0 x 6.1 cm and is again  noted to have thin peripheral mural calcifications, unchanged compared to contrast-enhanced CT scan performed 01/20/2017. No urinary obstruction. Normal noncontrast appearance of the bilateral adrenal glands. The urinary bladder was not imaged. Stomach/Bowel: The anterior wall of the stomach is well apposed against the ventral wall of the upper abdomen without interposed liver or transverse colon. No hiatal hernia. Ingested enteric contrast is seen within the colon. No definite evidence of enteric obstruction. No pneumoperitoneum, pneumatosis or portal venous gas. Vascular/Lymphatic: Atherosclerotic plaque within a normal caliber abdominal aorta. No bulky retroperitoneal, mesenteric, pelvic or inguinal lymphadenopathy on this noncontrast examination. Other: Regional soft tissues are normal. Musculoskeletal: No acute or aggressive osseous abnormalities. Stigmata of DISH within the thoracic spine. IMPRESSION: 1. Gastric anatomy amenable to attempted percutaneous gastrostomy tube placement as indicated. 2. Small bilateral effusions with associated basilar opacities - atelectasis versus infiltrate. 3. Sequela of prior granulomatous  infection as above. 4.  Aortic Atherosclerosis (ICD10-I70.0). Electronically Signed   By: Sandi Mariscal M.D.   On: 09/28/2018 16:48   Medications:  Scheduled Meds:  feeding supplement (ENSURE ENLIVE)  237 mL Oral TID BM   multivitamin with minerals  1 tablet Oral Daily   neomycin-bacitracin-polymyxin   Topical Daily   pantoprazole (PROTONIX) IV  40 mg Intravenous Q12H   vitamin C  250 mg Oral BID   Continuous Infusions:  dextrose 75 mL/hr at 09/28/18 0617   doxycycline (VIBRAMYCIN) IV 100 mg (09/28/18 0924)   PRN Meds:.HYDROcodone-acetaminophen, morphine injection, ondansetron **OR** ondansetron (ZOFRAN) IV, polyethylene glycol   Assessment: Active Problems:   Syncope   Blood in stool   Elevated transaminase level   Anemia   Thrombocytopenia (HCC)    Plan: Patient's hemoglobin remains stable  Patient's work-up has shown positive EBV IgG and IgM.  Dr. Posey Pronto was informed and is discussing this with infectious disease  His liver enzymes have continued to improve.  Continue daily CMP.  Avoid hepatotoxic drugs  I was asked about PEG tube placement due to patient's dysphagia.  However, due to his acute subdural hemorrhage, and no evidence of active GI bleeding, we have elected to postpone his endoscopy for evaluation of anemia to an outpatient setting to allow for safer procedure, once acute medical issues have resolved.  For the same reason an endoscopically placed feeding tube would require sedation, which would again put the patient at risks associated with endoscopy in the setting of acute medical issues.  Therefore, if a feeding tube is required, I have asked Dr. Posey Pronto to discuss this with interventional radiology   LOS: 9 days   Glenn Antigua, MD 09/28/2018, 5:07 PM

## 2018-09-28 NOTE — Consult Note (Signed)
PHARMACY CONSULT NOTE - FOLLOW UP  Pharmacy Consult for Electrolyte Monitoring and Replacement   Recent Labs: Potassium (mmol/L)  Date Value  09/28/2018 3.7   Magnesium (mg/dL)  Date Value  09/26/2018 1.9   Calcium (mg/dL)  Date Value  09/28/2018 7.7 (L)   Albumin (g/dL)  Date Value  09/27/2018 2.3 (L)   Phosphorus (mg/dL)  Date Value  09/26/2018 3.0   Sodium (mmol/L)  Date Value  09/28/2018 139   Corrected Ca: 9.4   Assessment: Patient admitted with  Hypokalemia. Pharmacy consulted for electrolyte monitoring and replacement. Patient unable to take oral replacement at this time.   Goal of Therapy:  Electrolytes WNL  Plan:  Patient receiving NaCl w/KCL 48mEq @ 75 mL/hr - now d/c'ed .  K+ 3.7. No need to replacement at this time.   Will F/U with AM labs and continue to replace electrolytes as needed.   Oswald Hillock, PharmD, BCPS Clinical Pharmacist 09/28/2018 7:56 AM

## 2018-09-28 NOTE — Progress Notes (Signed)
Hematology/Oncology Progress Note [facility] [phone] [phone]  Patient Care Team: [name], MD as PCP - General (Family Medicine)   Name of the patient: [name]  [id number]  83-27-1935  Date of visit: 09/28/18   INTERVAL HISTORY-  Patient is lying in the bed, no acute distress. Denies any abdominal pain or fullness today.   Review of systems- Review of Systems  Constitutional: Positive for fatigue. Negative for chills and fever.  Respiratory: Negative for chest tightness and cough.   Cardiovascular: Negative for chest pain.  Gastrointestinal: Negative for abdominal pain and nausea.  Genitourinary: Negative for difficulty urinating.   Skin: Negative for rash.  Neurological: Negative for headaches.  Psychiatric/Behavioral: Negative for confusion.    No Known Allergies  Patient Active Problem List   Diagnosis Date Noted   Anemia    Thrombocytopenia ([place])    Blood in stool    Elevated transaminase level    Syncope 09/19/2018     Past Medical History:  Diagnosis Date   Arthritis    Cancer [facility])    Prostate Cancer   Hypertension      Past Surgical History:  Procedure Laterality Date   ESOPHAGOGASTRODUODENOSCOPY (EGD) WITH PROPOFOL N/A 12/06/2017   Procedure: ESOPHAGOGASTRODUODENOSCOPY (EGD) WITH PROPOFOL;  Surgeon: Jonathon Bellows, MD;  Location: [facility] ENDOSCOPY;  Service: Gastroenterology;  Laterality: N/A;   PROSTATE CRYOABLATION      Social History   Socioeconomic History   Marital status: Widowed    Spouse name: Not on file   Number of children: Not on file   Years of education: Not on file   Highest education level: Not on file  Occupational History   Not on file  Social Needs   Financial resource strain: Not on file   Food insecurity    Worry: Not on file    Inability: Not on file   Transportation needs    Medical: Not on file    Non-medical: Not on file  Tobacco Use    Smoking status: Former Smoker    Types: Pipe    Quit date: 03/30/2002    Years since quitting: 16.5   Smokeless tobacco: Never Used  Substance and Sexual Activity   Alcohol use: Yes   Drug use: Never   Sexual activity: Not on file  Lifestyle   Physical activity    Days per week: Not on file    Minutes per session: Not on file   Stress: Not on file  Relationships   Social connections    Talks on phone: Not on file    Gets together: Not on file    Attends religious service: Not on file    Active member of club or organization: Not on file    Attends meetings of clubs or organizations: Not on file    Relationship status: Not on file   Intimate partner violence    Fear of current or ex partner: Not on file    Emotionally abused: Not on file    Physically abused: Not on file    Forced sexual activity: Not on file  Other Topics Concern   Not on file  Social History Narrative   Not on file     History reviewed. No pertinent family history.   Current Facility-Administered Medications:    dextrose 5 % solution, , Intravenous, Continuous, Harrie Foreman, MD, Last Rate: 75 mL/hr at 09/28/18 0617   doxycycline (VIBRAMYCIN) 100 mg in sodium chloride 0.9 %  250 mL IVPB, 100 mg, Intravenous, Q12H, Oswald Hillock, RPH, Last Rate: 125 mL/hr at 09/28/18 0924, 100 mg at 09/28/18 4158   feeding supplement (ENSURE ENLIVE) (ENSURE ENLIVE) liquid 237 mL, 237 mL, Oral, TID BM, Fritzi Mandes, MD, Stopped at 09/22/18 2010   HYDROcodone-acetaminophen (NORCO/VICODIN) 5-325 MG per tablet 1 tablet, 1 tablet, Oral, Q6H PRN, Fritzi Mandes, MD   morphine 2 MG/ML injection 1 mg, 1 mg, Intravenous, Q4H PRN, Dustin Flock, MD, 1 mg at 09/25/18 3094   multivitamin with minerals tablet 1 tablet, 1 tablet, Oral, Daily, Fritzi Mandes, MD, Stopped at 09/26/18 1000   neomycin-bacitracin-polymyxin (NEOSPORIN) ointment packet, , Topical, Daily, Dustin Flock, MD, 1 application at 07/68/08 0919    ondansetron (ZOFRAN) tablet 4 mg, 4 mg, Oral, Q6H PRN **OR** ondansetron (ZOFRAN) injection 4 mg, 4 mg, Intravenous, Q6H PRN, Mody, Sital, MD   pantoprazole (PROTONIX) injection 40 mg, 40 mg, Intravenous, Q12H, Fritzi Mandes, MD, 40 mg at 09/28/18 8110   polyethylene glycol (MIRALAX / GLYCOLAX) packet 17 g, 17 g, Oral, Daily PRN, Mody, Sital, MD   vitamin C (ASCORBIC ACID) tablet 250 mg, 250 mg, Oral, BID, Fritzi Mandes, MD, Stopped at 09/22/18 2011   Physical exam:  Vitals:   09/27/18 1622 09/27/18 2042 09/28/18 0324 09/28/18 0812  BP: (!) 138/93 130/78 126/90 138/89  Pulse: 88 86 87 90  Resp: 18 18 18 19   Temp: (!) 97.5 F (36.4 C) 98.3 F (36.8 C) 99.3 F (37.4 C) 98.9 F (37.2 C)  TempSrc: Oral Oral Oral Oral  SpO2: 99% 97% 98% 97%  Weight:      Height:       Physical Exam  Constitutional: He is oriented to person, place, and time. No distress.  Frail appearance, lying the bed  HENT:  Mouth/Throat: No oropharyngeal exudate.  Left forehead bruising  Eyes: Pupils are equal, round, and reactive to light. EOM are normal. No scleral icterus.  Neck: Normal range of motion. Neck supple.  Cardiovascular: Normal rate and regular rhythm.  No murmur heard. Pulmonary/Chest: Effort normal. No respiratory distress. He has no rales. He exhibits no tenderness.  Abdominal: Soft. He exhibits no distension. There is no abdominal tenderness.  Musculoskeletal: Normal range of motion.        General: No edema.     Comments: Slurred speech.   Neurological: He is alert and oriented to person, place, and time.  Skin: Skin is warm and dry. He is not diaphoretic.  Bruises  Psychiatric: Affect normal.       CMP Latest Ref Rng & Units 09/28/2018  Glucose 70 - 99 mg/dL 112(H)  BUN 8 - 23 mg/dL 13  Creatinine 0.61 - 1.24 mg/dL 0.62  Sodium 135 - 145 mmol/L 139  Potassium 3.5 - 5.1 mmol/L 3.7  Chloride 98 - 111 mmol/L 109  CO2 22 - 32 mmol/L 25  Calcium 8.9 - 10.3 mg/dL 7.7(L)  Total Protein  6.5 - 8.1 g/dL -  Total Bilirubin 0.3 - 1.2 mg/dL -  Alkaline Phos 38 - 126 U/L -  AST 15 - 41 U/L -  ALT 0 - 44 U/L -   CBC Latest Ref Rng & Units 09/28/2018  WBC 4.0 - 10.5 K/uL 7.8  Hemoglobin 13.0 - 17.0 g/dL 8.4(L)  Hematocrit 39.0 - 52.0 % 26.3(L)  Platelets 150 - 400 K/uL 320    RADIOGRAPHIC STUDIES: I have personally reviewed the radiological images as listed and agreed with the findings in the report.  Dg Chest  1 View  Result Date: 09/19/2018 CLINICAL DATA:  85 year old male with weakness and fever. COVID-19 status pending. EXAM: CHEST  1 VIEW COMPARISON:  CT Abdomen and Pelvis 01/20/2017. FINDINGS: Portable AP upright view at 1506 hours. Calcified aortic atherosclerosis. Other mediastinal contours are within normal limits. Visualized tracheal air column is within normal limits. Lung volumes are at the upper limits of normal. No pneumothorax, pleural effusion or pulmonary edema. No confluent pulmonary opacity. External button artifact suspected over the anterior 2nd rib on image #1. Chronic appearing posterior right rib fractures. Negative visible bowel gas pattern. IMPRESSION: No acute cardiopulmonary abnormality. Electronically Signed   By: Genevie Ann M.D.   On: 09/19/2018 15:39   Dg Pelvis 1-2 Views  Result Date: 09/20/2018 CLINICAL DATA:  Multiple falls.  Bilateral hip pain and weakness. EXAM: PELVIS - 1-2 VIEW COMPARISON:  CT 01/20/2017. FINDINGS: Diffuse osteopenia. Degenerative change both hips. No evidence of fracture or dislocation. A stable tiny sclerotic density noted in the left pubis consistent with a bone island. Pelvic calcifications consistent phleboliths. Peripheral vascular calcification. IMPRESSION: 1. Diffuse osteopenia. Degenerative change both hips. No acute bony abnormality. 2.  Peripheral vascular disease. Electronically Signed   By: Marcello Moores  Register   On: 09/20/2018 09:56   Ct Head Wo Contrast  Result Date: 09/21/2018 CLINICAL DATA:  Follow-up examination for  subdural hemorrhage. EXAM: CT HEAD WITHOUT CONTRAST TECHNIQUE: Contiguous axial images were obtained from the base of the skull through the vertex without intravenous contrast. COMPARISON:  Previous brain MRI from earlier the same day. FINDINGS: Brain: Again seen is a left holo hemispheric subdural hematoma. This measures up to 8 mm in maximal dimension at the level of the left frontal lobe. Extension along the falx, with left parafalcine component measuring up to 6 mm. Mild mass effect with associated trace 2 mm left-to-right shift. Overall, appearance is stable from previous MRI. No other definite acute intracranial hemorrhage. No acute large vessel territory infarct. Previously identified punctate left frontal cortical infarct not seen by CT. Underlying atrophy with chronic microvascular ischemic disease noted. No mass lesion. No hydrocephalus. Vascular: No hyperdense vessel. Scattered vascular calcifications noted within the carotid siphons. Skull: Evolving left frontal scalp contusion/hematoma. Calvarium intact. Sinuses/Orbits: Globes and orbital soft tissues within normal limits. Paranasal sinuses and mastoid air cells are clear. Other: None. IMPRESSION: 1. Stable size and appearance of left holo hemispheric subdural hematoma measuring up to 8 mm in maximal dimension. Associated trace 2 mm left-to-right shift. 2. Evolving left frontal scalp contusion/hematoma. 3. No other new acute intracranial process. Electronically Signed   By: Jeannine Boga M.D.   On: 09/21/2018 18:45   Ct Head Wo Contrast  Result Date: 09/19/2018 CLINICAL DATA:  Pt states he fell and hit his head on the bath tub today. Pt denies LOC EXAM: CT HEAD WITHOUT CONTRAST TECHNIQUE: Contiguous axial images were obtained from the base of the skull through the vertex without intravenous contrast. COMPARISON:  None. FINDINGS: Brain: No evidence of acute infarction, hemorrhage, extra-axial collection, ventriculomegaly, or mass effect.  Generalized cerebral atrophy. Periventricular white matter low attenuation likely secondary to microangiopathy. Vascular: Cerebrovascular atherosclerotic calcifications are noted. Skull: Negative for fracture or focal lesion. Sinuses/Orbits: Visualized portions of the orbits are unremarkable. Visualized portions of the paranasal sinuses and mastoid air cells are unremarkable. Other: Large left frontal scalp hematoma. IMPRESSION: No acute intracranial pathology. Electronically Signed   By: Kathreen Devoid   On: 09/19/2018 15:42   Mr Brain Wo Contrast  Result Date: 09/21/2018  CLINICAL DATA:  Initial evaluation for acute speech difficulty, recent falls. EXAM: MRI HEAD WITHOUT CONTRAST TECHNIQUE: Multiplanar, multiecho pulse sequences of the brain and surrounding structures were obtained without intravenous contrast. COMPARISON:  Prior CT from 09/19/2018. FINDINGS: Brain: Generalized age-related cerebral atrophy with mild chronic small vessel ischemic disease. Remote lacunar infarct present at the right external capsule/lentiform nucleus. Small amount of associated chronic hemosiderin staining. There has been interval development of an acute subdural hematoma overlying the left cerebral convexity. This measures up to 8 mm in maximal diameter. Extension along the falx. Associated trace 2 mm left-to-right shift. No hydrocephalus or ventricular trapping. Basilar cisterns remain patent. 4 mm focus of diffusion abnormality involving the left frontal cortex at the level of the operculum suspicious for a small acute ischemic infarct (series 7, image 22). No associated hemorrhage. No other evidence for acute or subacute ischemia. Gray-white matter differentiation otherwise maintained. Pituitary gland prominent with abnormal convex border superiorly (series 9, image 12). No other mass lesion. Midline structures intact. Vascular: Major intracranial vascular flow voids are maintained. Skull and upper cervical spine:  Craniocervical junction within normal limits. Multilevel degenerative spondylolysis noted within the upper cervical spine without high-grade stenosis. Bone marrow signal intensity within normal limits. Evolving soft tissue contusion/hematoma at the left frontal scalp. Sinuses/Orbits: Globes and orbital soft tissues within normal limits. Paranasal sinuses are clear. Small right mastoid effusion noted, of doubtful significance. Inner ear structures grossly normal. Other: None. IMPRESSION: 1. Delayed intracranial hemorrhage, with interval development of acute left subdural hematoma, measuring up to 8 mm in maximal thickness. Associated mild mass effect with trace 2 mm left-to-right shift. 2. 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 3. Underlying age-related cerebral atrophy with mild chronic microvascular ischemic disease. 4. Abnormal in prominent appearance of the pituitary gland, raising the possibility for an underlying pituitary lesion. Correlation with laboratory values recommended. Additionally, further assessment with dedicated pituitary protocol MRI suggested for further evaluation. This could be performed on a nonemergent outpatient basis. Critical Value/emergent results were called by telephone at the time of interpretation on 09/21/2018 at 2:30 pm to Dr. Fritzi Mandes , who verbally acknowledged these results. Electronically Signed   By: Jeannine Boga M.D.   On: 09/21/2018 14:37   US Abdomen Complete  Result Date: 09/20/2018 CLINICAL DATA:  Elevated liver function tests. EXAM: ABDOMEN ULTRASOUND COMPLETE COMPARISON:  CT abdomen and pelvis 01/20/2017. FINDINGS: Gallbladder: No gallstones or wall thickening visualized. No sonographic Murphy sign noted by sonographer. Common bile duct: Diameter: 0.2 cm Liver: No focal lesion identified. Within normal limits in parenchymal echogenicity. Portal vein is patent on color Doppler imaging with normal  direction of blood flow towards the liver. IVC: No abnormality visualized. Pancreas: Visualized portion unremarkable. Spleen: Size and appearance within normal limits. A few small calcifications in the spleen consistent with old granulomatous disease are seen as on prior CT. Right Kidney: Length: 12.5 cm. Echogenicity within normal limits. No solid mass or hydronephrosis visualized. 7.4 cm in diameter cyst is identified as seen on prior CT. Calcification in the wall of this cyst is unchanged. Left Kidney: Length: 12.5 cm. Echogenicity within normal limits. No solid mass or hydronephrosis visualized. Two simple cysts measuring 2.0 and 1.8 cm in diameter are unchanged. A 1.5 cm stone in the mid to lower pole is identified and was present on the prior CT. Abdominal aorta: No aneurysm visualized.  Atherosclerosis noted. Other findings: None. IMPRESSION: No acute abnormality.  Normal-appearing liver and  gallbladder. Nonobstructing stone lower pole left kidney. Bilateral renal cysts. Atherosclerosis. Electronically Signed   By: Inge Rise M.D.   On: 09/20/2018 10:24    Assessment and plan-  Patient is a 85 y.o. male history of hypertension, history of prostate cancer, presented for evaluation of multiple falls and generalized weakness.  Found to be febrile in the emergency room.   # Anemia, s/p 1 unit PRBC yesterday,  Preadmission baseline unknown.  Hemoglobin in 2018 was around 13. No overt evidence of hemolysis.  LDH was previously elevated, which is trending down now indirect bilirubin normal. Haptoglobin pending. Multiple myeloma panel negative.  No M protein Positive hem occult. -GI work up outpatient.  Anemia is likely due to blood loss from hematoma, and also GI blood loss.  Initial retic panel showed inappropraite low reticulocytosis likely bone marrow suppression due to acute illness Repeat Reticulocyte panel shows normal reticulocyte hemoglobin- indicating adequate iron store,  increased  reticulocytosis compared to 1 week ago. Anticipate hemoglobin to recover gradually after acute issue resolves. Underlying MDS need to be ruled out if hemoglobin persistently trending down. Currently he does not have any active bleeding hemoglobin stays between 8-9, he can have outpatient bone marrow biopsy if hemoglobin trends further down.   # Tranaminitis, ?underlying underlying liver disease/alcohol use. Trending down.  # History of prostate cancer, outpatient follow up. PSA pending.  # High ferritin, likely due to acute illness, trending down now.  Less likely [place], given resolved febrile illness, no organomegaly, cytopenia due to other eitology- GI bleeds.  #Intracranial hematoma/bleeding, secondary to injury.   neurosurgery on board. Continue neuro check   Thank you for allowing me to participate in the care of this patient.    Earlie Server, MD, PhD 09/28/2018

## 2018-09-28 NOTE — Progress Notes (Signed)
Pts daughter, [name], updated on pt condition and plan of care by this RN at 1127.

## 2018-09-28 NOTE — TOC Progression Note (Signed)
Transition of Care [facility]) - Progression Note    Patient Details  Name: [name] MRN: [id number] Date of Birth: 01/28/1934  [facility]) CM/SW Contact  Katrina Stack, RN Phone Number: 09/28/2018, 3:50 PM  Clinical Narrative:     Proceeding with PEG placement.  Spoke with daughter [name] and gave bed offers.  Faxed her Medicare Compare data for nursing homes.  She declines offers from [facility] and [facility] due to negative previous experience. Thinking about [facility].  Asked that CM send to [facility].  CM contacted several as facilities not on the [place]. [facility] and [facility] are not accepting referrals.. Sent referral to [facility] via [employer]. Manually faxed referral to [facility] fax 1 [phone]; Phone- [phone]. Patient tested negative for covid on 09/19/18. Will have to inquire with facility that accepts as to whether another test should be performed. Requested CMA to check SNF benefits and copay information. Updated patient's daughter and asked that if she had any particular facilities towards [facility] [place] she would like to pursue to give CM contact information.  She does not wish to pursue facilities towards [facility] as the scores are low on [email or web address] Received a bed offer from [facility] and extended it to daughter.  Expected Discharge Plan: Skilled Nursing Facility Barriers to Discharge: Continued Medical Work up  Expected Discharge Plan and Services Expected Discharge Plan: [place]   Discharge Planning Services: CM Consult                                           Social Determinants of Health (SDOH) Interventions    Readmission Risk Interventions Readmission Risk Prevention Plan 09/21/2018  Post Dischage Appt Complete  Medication Screening Complete  Transportation Screening Complete  Some recent data might be hidden

## 2018-09-29 ENCOUNTER — Telehealth: Payer: Self-pay

## 2018-09-29 LAB — CBC
HCT: 27 % — ABNORMAL LOW (ref 39.0–52.0)
Hemoglobin: 8.7 g/dL — ABNORMAL LOW (ref 13.0–17.0)
MCH: 29.5 pg (ref 26.0–34.0)
MCHC: 32.2 g/dL (ref 30.0–36.0)
MCV: 91.5 fL (ref 80.0–100.0)
Platelets: 334 10*3/uL (ref 150–400)
RBC: 2.95 MIL/uL — ABNORMAL LOW (ref 4.22–5.81)
RDW: 14.5 % (ref 11.5–15.5)
WBC: 7.9 10*3/uL (ref 4.0–10.5)
nRBC: 0 % (ref 0.0–0.2)

## 2018-09-29 LAB — HEPATIC FUNCTION PANEL
ALT: 52 U/L — ABNORMAL HIGH (ref 0–44)
AST: 46 U/L — ABNORMAL HIGH (ref 15–41)
Albumin: 2.4 g/dL — ABNORMAL LOW (ref 3.5–5.0)
Alkaline Phosphatase: 80 U/L (ref 38–126)
Bilirubin, Direct: 0.4 mg/dL — ABNORMAL HIGH (ref 0.0–0.2)
Indirect Bilirubin: 0.9 mg/dL (ref 0.3–0.9)
Total Bilirubin: 1.3 mg/dL — ABNORMAL HIGH (ref 0.3–1.2)
Total Protein: 4.7 g/dL — ABNORMAL LOW (ref 6.5–8.1)

## 2018-09-29 LAB — BASIC METABOLIC PANEL
Anion gap: 5 (ref 5–15)
BUN: 14 mg/dL (ref 8–23)
CO2: 24 mmol/L (ref 22–32)
Calcium: 7.7 mg/dL — ABNORMAL LOW (ref 8.9–10.3)
Chloride: 109 mmol/L (ref 98–111)
Creatinine, Ser: 0.69 mg/dL (ref 0.61–1.24)
GFR calc Af Amer: >60 mL/min (ref >60)
GFR calc non Af Amer: >60 mL/min (ref >60)
Glucose, Bld: 107 mg/dL — ABNORMAL HIGH (ref 70–99)
Potassium: 3.8 mmol/L (ref 3.5–5.1)
Sodium: 138 mmol/L (ref 135–145)

## 2018-09-29 LAB — HEPATITIS C VRS RNA DETECT BY PCR-QUAL: Hepatitis C Vrs RNA by PCR-Qual: NEGATIVE

## 2018-09-29 LAB — HAPTOGLOBIN: Haptoglobin: <10 mg/dL — ABNORMAL LOW (ref 38–329)

## 2018-09-29 LAB — MAGNESIUM: Magnesium: 1.7 mg/dL (ref 1.7–2.4)

## 2018-09-29 LAB — HERPES SIMPLEX VIRUS(HSV) DNA BY PCR: HSV 2 DNA: NEGATIVE

## 2018-09-29 LAB — CYTOMEGALOVIRUS DNA, QUANTITATIVE REAL-TIME PCR, PLASMA
CMV DNA Quant: NEGATIVE [IU]/mL
Log10 CMV Qn DNA Pl: UNDETERMINED {Log_IU}/mL

## 2018-09-29 LAB — PHOSPHORUS: Phosphorus: 2.5 mg/dL (ref 2.5–4.6)

## 2018-09-29 MED ORDER — SODIUM CHLORIDE 0.9 % IV SOLN
100.0000 mg | Freq: Two times a day (BID) | INTRAVENOUS | Status: AC
  Administered 2018-09-29 (×2): 100 mg via INTRAVENOUS
  Filled 2018-09-29 (×2): qty 100

## 2018-09-29 MED ORDER — DEXTROSE 5 % IV SOLN
INTRAVENOUS | Status: AC
  Administered 2018-09-29 – 2018-09-30 (×2): via INTRAVENOUS

## 2018-09-29 MED ORDER — MAGNESIUM SULFATE 2 GM/50ML IV SOLN
2.0000 g | Freq: Once | INTRAVENOUS | Status: AC
  Administered 2018-09-29: 2 g via INTRAVENOUS
  Filled 2018-09-29: qty 50

## 2018-09-29 NOTE — Consult Note (Signed)
PHARMACY CONSULT NOTE - FOLLOW UP  Pharmacy Consult for Electrolyte Monitoring and Replacement   Recent Labs: Potassium (mmol/L)  Date Value  09/29/2018 3.8   Magnesium (mg/dL)  Date Value  09/29/2018 1.7   Calcium (mg/dL)  Date Value  09/29/2018 7.7 (L)   Albumin (g/dL)  Date Value  09/27/2018 2.3 (L)   Phosphorus (mg/dL)  Date Value  09/29/2018 2.5   Sodium (mmol/L)  Date Value  09/29/2018 138   Corrected Ca: 9.4   Assessment: Patient admitted with  Hypokalemia. Pharmacy consulted for electrolyte monitoring and replacement. Patient unable to take oral replacement at this time. Corrected Ca is 9.1.   Goal of Therapy:  Electrolytes WNL  Plan:  Patient receiving NaCl w/KCL 11mEq @ 75 mL/hr - now d/c'ed .  K+ 3.8. Mg 1.7 - will give Mg 2g IV x 1.    Will F/U with AM labs and continue to replace electrolytes as needed.   Oswald Hillock, PharmD, BCPS Clinical Pharmacist 09/29/2018 8:26 AM

## 2018-09-29 NOTE — TOC Progression Note (Signed)
Transition of Care [facility]) - Progression Note    Patient Details  Name: [name] MRN: [id number] Date of Birth: [birth year]  Transition [facility] Care [facility]) CM/SW Contact  Brylee Berk, Lenice Llamas Phone Number: [phone]  09/29/2018, 9:17 AM  Clinical Narrative: Clinical Social Worker (CSW) manually faxed SNF referral to [place] admissions coordinator at [facility] [place].     Expected Discharge Plan: [place] Barriers to Discharge: Continued Medical Work up  Expected Discharge Plan and Services Expected Discharge Plan: [place]   Discharge Planning Services: CM Consult                                           Social Determinants of Health (SDOH) Interventions    Readmission Risk Interventions Readmission Risk Prevention Plan 09/21/2018  Post Dischage Appt Complete  Medication Screening Complete  Transportation Screening Complete  Some recent data might be hidden

## 2018-09-29 NOTE — TOC Progression Note (Signed)
Transition of Care [facility]) - Progression Note    Patient Details  Name: [name] MRN: [id number] Date of Birth: 1934-01-31  Transition of Care [facility]) CM/SW Contact  Quinnlyn Hearns, Lenice Llamas Phone Number: [phone]  09/29/2018, 9:51 AM  Clinical Narrative: Per Estill Bamberg admissions coordinator at [facility] she can make a bed offer. Clinical Education officer, museum (CSW) attempted to contact patient's daughter [name] to give her offer however she did not answer and a voicemail was left.     Expected Discharge Plan: Skilled Nursing Facility Barriers to Discharge: Continued Medical Work up  Expected Discharge Plan and Services Expected Discharge Plan: [place]   Discharge Planning Services: CM Consult                                           Social Determinants of Health (SDOH) Interventions    Readmission Risk Interventions Readmission Risk Prevention Plan 09/21/2018  Post Dischage Appt Complete  Medication Screening Complete  Transportation Screening Complete  Some recent data might be hidden

## 2018-09-29 NOTE — Progress Notes (Signed)
Physical Therapy Treatment Patient Details Name: [name] MRN: [id number] DOB: 04/25/1933 Today's Date: 09/29/2018    History of Present Illness 85 y/o admitted for syncope with multiple falls at home. Complains of hip pain upon arrival to hospital. History includes HTN and prostate cancer.  Noted multiple abrasions to body     PT Comments    Pt did relatively well with bed exercises, bed mobility and multiple bouts of sit to stand.  However with both ambulation attempts he had poor quality, shuffling gait and some apparent fear/hesitanty and need to quickly get back to sitting.  Pt reports that he was able to do long walk outside of room in the last 2 days (no notes or discussion with nursing revealed the voracity of this claim).  Pt very weak and per all PT assessment thus far he will require further PT intervention in a STR setting.   Follow Up Recommendations  SNF     Equipment Recommendations  Other (comment)    Recommendations for Other Services       Precautions / Restrictions Precautions Precautions: Fall Restrictions Weight Bearing Restrictions: No    Mobility  Bed Mobility Overal bed mobility: Needs Assistance Bed Mobility: Supine to Sit     Supine to sit: Min assist     General bed mobility comments: Pt was slow and effortful, but actually needed very little assist to get to EOB  Transfers Overall transfer level: Needs assistance Equipment used: Rolling walker (2 wheeled) Transfers: Sit to/from Stand Sit to Stand: Min assist         General transfer comment: Pt able to rise to standing (from elevated bed height) with walker X 2 this session with only light min assist to keep weight forward  Ambulation/Gait Ambulation/Gait assistance: Min assist Gait Distance (Feet): 3 Feet Assistive device: Rolling walker (2 wheeled)       General Gait Details: 2 "ambulation" bouts with both displaying poor confidence, shuffling/unsteady gait, reliant on  walker and ultiamtely needing to sit quickly with both efforts   Stairs             Wheelchair Mobility    Modified Rankin (Stroke Patients Only)       Balance Overall balance assessment: Needs assistance Sitting-balance support: Bilateral upper extremity supported Sitting balance-Leahy Scale: Fair Sitting balance - Comments: Pt with reasonably good stability sitting EOB   Standing balance support: Bilateral upper extremity supported Standing balance-Leahy Scale: Fair Standing balance comment: Reliant on walker, poor confidence and tolerance                            Cognition Arousal/Alertness: Awake/alert Behavior During Therapy: Restless Overall Cognitive Status: Difficult to assess                                        Exercises Other Exercises Other Exercises: b/l supine LE exercises 5-10 reps each including: hip Ab/Ad, heel slides/leg ext, ankle DF, SLRs all with light resistance    General Comments        Pertinent Vitals/Pain Pain Assessment: No/denies pain    Home Living                      Prior Function            PT Goals (current goals can now be found  in the care plan section) Progress towards PT goals: Progressing toward goals    Frequency    Min 2X/week      PT Plan Current plan remains appropriate    Co-evaluation              AM-PAC PT "6 Clicks" Mobility   Outcome Measure  Help needed turning from your back to your side while in a flat bed without using bedrails?: A Little Help needed moving from lying on your back to sitting on the side of a flat bed without using bedrails?: A Little Help needed moving to and from a bed to a chair (including a wheelchair)?: A Little Help needed standing up from a chair using your arms (e.g., wheelchair or bedside chair)?: A Lot Help needed to walk in hospital room?: A Lot Help needed climbing 3-5 steps with a railing? : Total 6 Click Score:  14    End of Session Equipment Utilized During Treatment: Gait belt Activity Tolerance: Patient limited by fatigue Patient left: in chair;with call bell/phone within reach;with nursing/sitter in room;with family/visitor present   PT Visit Diagnosis: Unsteadiness on feet (R26.81);Muscle weakness (generalized) (M62.81);History of falling (Z91.81);Difficulty in walking, not elsewhere classified (R26.2);Dizziness and giddiness (R42)     Time: 1701-1731 PT Time Calculation (min) (ACUTE ONLY): 30 min  Charges:  $Gait Training: 8-22 mins $Therapeutic Exercise: 8-22 mins                     Kreg Shropshire, DPT 09/29/2018, 5:49 PM

## 2018-09-29 NOTE — Care Management Important Message (Signed)
Important Message  Patient Details  Name: [name] MRN: [id number] Date of Birth: 10-20-1933   Medicare Important Message Given:  Yes     Dannette Barbara 09/29/2018, 1:19 PM

## 2018-09-29 NOTE — TOC Progression Note (Signed)
Transition of Care [facility]) - Progression Note    Patient Details  Name: DORNELL GRASMICK MRN: [id number] Date of Birth: March 19, 1934  Transition of [facility]) CM/SW Contact  Lenn Volker, Lenice Llamas Phone Number: [phone]  09/29/2018, 10:14 AM  Clinical Narrative: Patient's daughter [name] called Clinical Social Worker (CSW) back. CSW made Sherri aware that [facility] have offered a bed. CSW and daughter dicussed star ratings on medicare.gov and daughter reported that she prefers [facility]. Per daughter she has not made the decsion about [facility] but that is her preferance right now. CSW explained that [facility] will have to start [facility] authorization which could take 24-48 hours. Daughter is agreeable for [facility] to start [facility] authorization. Per [name] [facility] liaision she will start [facility] authorization today.      Expected Discharge Plan: Skilled Nursing Facility Barriers to Discharge: Continued Medical Work up  Expected Discharge Plan and Services Expected Discharge Plan: [place]   Discharge Planning Services: CM Consult                                           Social Determinants of Health (SDOH) Interventions    Readmission Risk Interventions Readmission Risk Prevention Plan 09/21/2018  Post Dischage Appt Complete  Medication Screening Complete  Transportation Screening Complete  Some recent data might be hidden

## 2018-09-29 NOTE — TOC Progression Note (Signed)
Transition of Care [facility]) - Progression Note    Patient Details  Name: [name] MRN: [id number] Date of Birth: Feb 01, 1934  Transition of Care [facility]) CM/SW Contact  Riann Oman, Lenice Llamas Phone Number: [phone]  09/29/2018, 3:38 PM  Clinical Narrative: Per [facility] authorization has been received. Clinical Social Worker (CSW) contacted patient's daughter [name] and made her aware of above. Daughter is in agreement with patient going to [facility] when medically stable. 2A Editor, commissioning approved for patient's daughter [name] to come to [facility] today and facetime with her own notary and witnesseses to get patient to sign his will. Per Sherri she will come to [facility] today between 5:30 pm and 6 pm. CSW will continue to follow and assist as needed.     Expected Discharge Plan: Skilled Nursing Facility Barriers to Discharge: Continued Medical Work up  Expected Discharge Plan and Services Expected Discharge Plan: [place]   Discharge Planning Services: CM Consult                                           Social Determinants of Health (SDOH) Interventions    Readmission Risk Interventions Readmission Risk Prevention Plan 09/21/2018  Post Dischage Appt Complete  Medication Screening Complete  Transportation Screening Complete  Some recent data might be hidden

## 2018-09-29 NOTE — Progress Notes (Signed)
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 Continue until 7/2 ( will need 2 doses today)  Abnormal LFTS, high LDH, high ferritin, all have improved- due to   ehrlichia-   Anemia : Multiple myeloma screen negative MDS in the D.D., Stool occult positive- As per heme onc bone marrow biopsy if HB trending down as OP   Gram positive rod in 1 of 4 blood culture-bacillus acontaminant-no need to treat   H/o prostate ca- had cryoablation-   According to patient He  did not have trouble swallowing before admission.,so could this be bulbar involvement, ? CNS? Paraneoplastic   Weight loss-   Discussed the management with Dr.PAtel ID will sign off- call if needed

## 2018-09-29 NOTE — Progress Notes (Signed)
Daily Progress Note   Patient Name: [name]       Date: 09/29/2018 DOB: Aug 26, 1933  Age: 85 y.o. MRN#: [id number] Attending Physician: Dustin Flock, MD Primary Care Physician: [name], MD Admit Date: 09/19/2018  Reason for Consultation/Follow-up: Establishing goals of care  Subjective: Patient is resting in bed. He states he has considered his options with his daughter, and he has decided to move forward with the PEG tube, and is hopeful the increased nutrition will help his QOL, as it was acceptable before his swallowing difficulties.   Revisited code status and if he would ever want to be placed on a ventilator, and he states he is not sure, that he will have to give the decisions more thought. He states he would like to make decisions one step at a time, as he gets to them. He states his daughter will know what to do if he is unable to make decisions.   Length of Stay: 10  Current Medications: Scheduled Meds:   feeding supplement (ENSURE ENLIVE)  237 mL Oral TID BM   multivitamin with minerals  1 tablet Oral Daily   neomycin-bacitracin-polymyxin   Topical Daily   pantoprazole (PROTONIX) IV  40 mg Intravenous Q12H   vitamin C  250 mg Oral BID    Continuous Infusions:  magnesium sulfate bolus IVPB 2 g (09/29/18 0859)    PRN Meds: HYDROcodone-acetaminophen, morphine injection, ondansetron **OR** ondansetron (ZOFRAN) IV, polyethylene glycol  Physical Exam Pulmonary:     Effort: Pulmonary effort is normal.  Skin:    General: Skin is warm and dry.  Neurological:     Mental Status: He is alert.             Vital Signs: BP (!) 138/92 (BP Location: Right Arm)    Pulse 94    Temp 98 F (36.7 C) (Oral)    Resp 17    Ht 6' (1.829 m)    Wt 60.5 kg    SpO2  97%    BMI 18.09 kg/m  SpO2: SpO2: 97 % O2 Device: O2 Device: Room Air O2 Flow Rate:    Intake/output summary:   Intake/Output Summary (Last 24 hours) at 09/29/2018 0947 Last data filed at 09/29/2018 0903 Gross per 24 hour  Intake 1583.28 ml  Output 1250 ml  Net 333.28 ml   LBM: Last BM Date: 09/28/18 Baseline Weight: Weight: 61.2 kg Most recent weight: Weight: 60.5 kg       Palliative Assessment/Data:      Patient Active Problem List   Diagnosis Date Noted   Anemia    Thrombocytopenia ([place])    Blood in stool    Elevated transaminase level    Syncope 09/19/2018    Palliative Care Assessment & Plan    Recommendations/Plan:  Patient has opted for PEG tube placement. Would recommend IR placement given his frailty.   Continue full code/full scope.    Code Status:    Code Status Orders  (From admission, onward)         Start     Ordered   09/19/18 2317  Full code  Continuous     09/19/18 2316        Code Status History    Date Active Date Inactive Code Status Order ID Comments User Context   09/19/2018 1812 09/19/2018 2316 DNR [id number]  Bettey Costa, MD ED   Advance Care Planning Activity    Advance Directive Documentation     Most Recent Value  Type of Advance Directive  Healthcare Power of Attorney  Pre-existing out of facility DNR order (yellow form or pink MOST form)  --  "MOST" Form in Place?  --       Prognosis:   Unable to determine  Discharge Planning:  To Be Determined   Thank you for allowing the Palliative Medicine Team to assist in the care of this patient.   Total Time 25 min Prolonged Time Billed  no      Greater than 50%  of this time was spent counseling and coordinating care related to the above assessment and plan.  Asencion Gowda, NP  Please contact Palliative Medicine Team phone at [phone] for questions and concerns.

## 2018-09-29 NOTE — TOC Benefit Eligibility Note (Signed)
Transition of Care [facility]) Benefit Eligibility Note    Patient Details  Name: [name] MRN: [id number] Date of Birth: Mar 14, 1934    SNF Eligibility [employer] Medicare, Talked with Max, [phone] Zero deductible Out-of-pocket = $4500 (Pt. Met $5.00) Effective date: 03/30/2018 Co-insurance/copay:  $0 1-20 days $160 per day for days 21-49 $0 per day for days 50-100 Maximum number of days = 100 3-day stay = No Precert- call [phone], option 3. Notification of admission is required on actual admission day.  Non-emergent ambulance transport: Co-insurance/copay:  $250 per one way Call [phone] with CPT code.                           [place] Phone Number: 09/29/2018, 11:38 AM

## 2018-09-29 NOTE — Progress Notes (Signed)
[place] [facility] [place] NAME: [name]    MR#:  [id number]  DATE OF BIRTH:  [birth year]  SUBJECTIVE:  Patient awaiting PEG tube placement   REVIEW OF SYSTEMS:   Review of Systems  Constitutional: Negative for chills, fever and weight loss.  HENT: Negative for ear discharge, ear pain and nosebleeds.   Eyes: Negative for blurred vision, pain and discharge.  Respiratory: Negative for sputum production, shortness of breath, wheezing and stridor.   Cardiovascular: Negative for chest pain, palpitations, orthopnea and PND.  Gastrointestinal: Negative for abdominal pain, diarrhea, melena, nausea and vomiting.  Genitourinary: Negative for frequency and urgency.  Musculoskeletal: Negative for back pain, falls and joint pain.  Neurological: Positive for weakness. Negative for sensory change, speech change and focal weakness.  Psychiatric/Behavioral: Negative for depression and hallucinations. The patient is not nervous/anxious.      DRUG ALLERGIES:  No Known Allergies  VITALS:  Blood pressure (!) 138/92, pulse 94, temperature 98 F (36.7 C), temperature source Oral, resp. rate 17, height 6' (1.829 m), weight 60.5 kg, SpO2 97 %.  PHYSICAL EXAMINATION:   Physical Exam  GENERAL:  85 y.o.-year-old patient lying in the bed with no acute distress.  EYES: Pupils equal, round, reactive to light and accommodation. No scleral icterus. Extraocular muscles intact.  HEENT: Head atraumatic, normocephalic. Oropharynx and nasopharynx clear.  Bruising on his forehead NECK:  Supple, no jugular venous distention. No thyroid enlargement, no tenderness.  LUNGS: Normal breath sounds bilaterally, no wheezing, rales, rhonchi. No use of accessory muscles of respiration.  CARDIOVASCULAR: S1, S2 normal. No murmurs, rubs, or gallops.  ABDOMEN: Soft, nontender, nondistended. Bowel sounds present. No organomegaly or mass.  EXTREMITIES: No cyanosis, clubbing or  edema b/l.    NEUROLOGIC: Cranial nerves II through XII are intact. No focal Motor or sensory deficits b/l.  Overall weak, mild dysarthria PSYCHIATRIC:  patient is alert and oriented x 3.  SKIN: No obvious rash, lesion, or ulcer.   LABORATORY PANEL:  CBC Recent Labs  Lab 09/29/18 0432  WBC 7.9  HGB 8.7*  HCT 27.0*  PLT 334    Chemistries  Recent Labs  Lab 09/29/18 0432  NA 138  K 3.8  CL 109  CO2 24  GLUCOSE 107*  BUN 14  CREATININE 0.69  CALCIUM 7.7*  MG 1.7  AST 46*  ALT 52*  ALKPHOS 80  BILITOT 1.3*   Cardiac Enzymes No results for input(s): TROPONINI in the last 168 hours. RADIOLOGY:  Ct Abdomen Wo Contrast  Result Date: 09/28/2018 CLINICAL DATA:  Dysphagia. Evaluate anatomy prior to potential percutaneous gastrostomy tube placement EXAM: CT ABDOMEN WITHOUT CONTRAST TECHNIQUE: Multidetector CT imaging of the abdomen was performed following the standard protocol without IV contrast. COMPARISON:  CT abdomen pelvis-01/20/2017; abdominal ultrasound-09/20/2018 FINDINGS: Lack of intravenous contrast limits the ability to evaluate solid abdominal organs. Lower chest: Limited visualization of the lower thorax demonstrates small bilateral effusions with associated bibasilar consolidative opacities. Normal heart size. Coronary artery calcifications. Trace amount of pericardial fluid, presumably physiologic. There is diffuse decreased attenuation of the intra cardiac blood pool suggestive of anemia. Hepatobiliary: Normal hepatic contour. Scattered punctate granuloma are seen within the liver. Normal noncontrast appearance of the gallbladder given degree distention. No radiopaque gallstones. No ascites. Pancreas: Normal noncontrast appearance of the pancreas. Spleen: Punctate granuloma within otherwise normal-appearing spleen. Adrenals/Urinary Tract: Note is made of 3 nonobstructing left-sided renal stones with dominant nonobstructing stone within the interpolar aspect of the  left  kidney measuring 1.1 x 0.7 cm (coronal image 46, series 5). Note is made of a punctate (approximately 3 mm) nonobstructing stone within the inferior pole of the right kidney (coronal image 9, series 5). Previously characterized bilateral renal cysts are grossly unchanged with dominant partially exophytic cyst arising from the inferior pole of the right kidney measuring approximately 8.0 x 6.1 cm and is again noted to have thin peripheral mural calcifications, unchanged compared to contrast-enhanced CT scan performed 01/20/2017. No urinary obstruction. Normal noncontrast appearance of the bilateral adrenal glands. The urinary bladder was not imaged. Stomach/Bowel: The anterior wall of the stomach is well apposed against the ventral wall of the upper abdomen without interposed liver or transverse colon. No hiatal hernia. Ingested enteric contrast is seen within the colon. No definite evidence of enteric obstruction. No pneumoperitoneum, pneumatosis or portal venous gas. Vascular/Lymphatic: Atherosclerotic plaque within a normal caliber abdominal aorta. No bulky retroperitoneal, mesenteric, pelvic or inguinal lymphadenopathy on this noncontrast examination. Other: Regional soft tissues are normal. Musculoskeletal: No acute or aggressive osseous abnormalities. Stigmata of DISH within the thoracic spine. IMPRESSION: 1. Gastric anatomy amenable to attempted percutaneous gastrostomy tube placement as indicated. 2. Small bilateral effusions with associated basilar opacities - atelectasis versus infiltrate. 3. Sequela of prior granulomatous infection as above. 4.  Aortic Atherosclerosis (ICD10-I70.0). Electronically Signed   By: Sandi Mariscal M.D.   On: 09/28/2018 16:48   ASSESSMENT AND PLAN:   85 year old male with history of hypertension who has had decreased appetite for several weeks who presents after multiple falls today and syncopal episode.  1. Syncope with hypotension secondary dehydration,  hypokalemia -holding of aspirin secondary to melena and Subdural hematoma Physical therapy consultation --SNF pt and dter sheri agreeable CT head on admission was unremarkable -MRI brain showed left frontal cortex subdural hematoma with s/o infarct -Patient seen by neurosurgery recommended to monitor patient -Dysphasia--patient and daughter want to proceed with the PEG tube, discussed with GI they state that patient is too high risk and recommends PEG tube by interventional radiology I discussed with Dr. Register today regarding when this can be done.  Waiting and answer back  2.  Acute kidney injury in the setting of dehydration:  -Hold nephrotoxic medications including blood pressure medications  - received IV fluids.  -Baseline creatinine 0.8 in October 2018 -Renal function normal -Had urinary retention requiring Foley  3.  Urinary retention continue Foley  4.  Hypernatremia due to free water deficit we will give D5W monitor sodium   5  Left Subdural Hematoma  S/p fall at home (delayed effects seen on MRI brain) with leftfrontal cortex CVA/infarct -hold ASA -seen by Dr Cari Caraway -Repeat CT head 09/20/18-- stable hematoma -MBS shows patient with severe dysphasia  6  Elevated liver function,LDH low platelet count and anemia - ultrasound abdomen-- no hepatosplenomegaly -seen by Dr Tasia Catchings -I have asked GI to see as well   7.  Fever: etio unclear ?viral -BC 1/4 GPR --bacillus species -d/c vanc, cefepime Chest x-ray and UA are unremarkable. -UC neg -COVID-19 on admission neg -tmax 103--now afebrile -ID consult appreciated USG abdomen--no acute abnormality -cont doxy for possible tick bite illness stop date tomorrow  8.  Hypertension Continue  9 HYpokalemia: Replace  10. History of prostate cancer -pelvic x-rays  Shows diffuse osteopenia  11.Acute on Chronic anemia with melena and left subdural hematoma -GI saw pt--colonoscopy as out pt (pt preference) -EGD  11/2017--Barretts esophagus -Hemoglobin stable   CODE STATUS: full  DVT Prophylaxis: SCD  TOTAL TIME TAKING CARE OF THIS PATIENT: *30* minutes.  >50% time spent on counselling and coordination of care  POSSIBLE D/C IN 2-3 DAYS, DEPENDING ON CLINICAL CONDITION.  Note: This dictation was prepared with Dragon dictation along with smaller phrase technology. Any transcriptional errors that result from this process are unintentional.  Dustin Flock M.D on 09/29/2018 at 2:09 PM  Between 7am to 6pm - Pager - [phone]  After 6pm go to [email or web address] - password [email or web address] [place] [facility]  Office  [phone]  CC: Primary care physician; [name], MDPatient ID: [name], male   DOB: 1933-09-27, 85 y.o.   MRN: [id number]

## 2018-09-29 NOTE — Progress Notes (Signed)
Glenn Antigua, MD [address] [place], [place], [place], [address] [address], [place], [place], [place], [address] Phone: [phone]  Fax: [phone]   Subjective: Patient resting in bed comfortably.  No abdominal pain.   Objective: Exam: Vital signs in last 24 hours: Vitals:   09/28/18 1721 09/28/18 2000 09/29/18 0430 09/29/18 0726  BP: 139/85 (!) 142/86 (!) 137/95 (!) 138/92  Pulse: 93 91 92 94  Resp: 19 20 20 17   Temp: 98.2 F (36.8 C) 98.5 F (36.9 C) 97.8 F (36.6 C) 98 F (36.7 C)  TempSrc: Oral Oral  Oral  SpO2: 100% 98% 100% 97%  Weight:      Height:       Weight change:   Intake/Output Summary (Last 24 hours) at 09/29/2018 1422 Last data filed at 09/29/2018 1331 Gross per 24 hour  Intake 1583.28 ml  Output 950 ml  Net 633.28 ml    General: No acute distress, AAO x3 Abd: Soft, NT/ND, No HSM Skin: Warm, no rashes Neck: Supple, Trachea midline   Lab Results: Lab Results  Component Value Date   WBC 7.9 09/29/2018   HGB 8.7 (L) 09/29/2018   HCT 27.0 (L) 09/29/2018   MCV 91.5 09/29/2018   PLT 334 09/29/2018   Micro Results: Recent Results (from the past 240 hour(s))  Blood culture (routine x 2)     Status: None   Collection Time: 09/19/18  3:35 PM   Specimen: BLOOD  Result Value Ref Range Status   Specimen Description BLOOD RIGHT ANTECUBITAL  Final   Special Requests   Final    BOTTLES DRAWN AEROBIC AND ANAEROBIC Blood Culture adequate volume   Culture   Final    NO GROWTH 5 DAYS Performed at [facility], [address], [place], [place] [address]    Report Status 09/24/2018 FINAL  Final  Urine Culture     Status: None   Collection Time: 09/19/18  3:35 PM   Specimen: Urine, Random  Result Value Ref Range Status   Specimen Description   Final    URINE, RANDOM Performed at [facility], [address] [place], [place] [address]    Special Requests   Final    Normal Performed at [facility],  [address] [place], [place] [address]    Culture   Final    NO GROWTH Performed at [facility], [place] [address] [place], [place] [address]    Report Status 09/20/2018 FINAL  Final  Blood culture (routine x 2)     Status: Abnormal   Collection Time: 09/19/18  3:36 PM   Specimen: BLOOD  Result Value Ref Range Status   Specimen Description   Final    BLOOD LEFT ANTECUBITAL Performed at [facility] [address], [place], [place] [address]    Special Requests   Final    BOTTLES DRAWN AEROBIC AND ANAEROBIC Blood Culture adequate volume Performed at [facility], [address] [place],  [address]    Culture  Setup Time   Final    AEROBIC BOTTLE ONLY GRAM POSITIVE RODS CRITICAL RESULT CALLED TO, READ BACK BY AND VERIFIED WITH: SHEEMA HALLAJI AT 2229 ON 09/20/2018 JJB    Culture (A)  Final    BACILLUS SPECIES NOT ANTHRACIS THE SIGNIFICANCE OF ISOLATING THIS ORGANISM FROM A SINGLE SET OF BLOOD CULTURES WHEN MULTIPLE SETS ARE DRAWN IS UNCERTAIN. PLEASE NOTIFY THE MICROBIOLOGY DEPARTMENT WITHIN ONE WEEK IF SPECIATION AND SENSITIVITIES  ARE REQUIRED. Performed at [facility], [place] [address] [place], [place] [address]    Report Status 09/22/2018 FINAL  Final  SARS Coronavirus 2 (CEPHEID- Performed in [facility] lab), Hosp Order     Status: None   Collection Time: 09/19/18  3:36 PM   Specimen: Nasopharyngeal Swab  Result Value Ref Range Status   SARS Coronavirus 2 NEGATIVE NEGATIVE Final    Comment: (NOTE) If result is NEGATIVE SARS-CoV-2 target nucleic acids are NOT DETECTED. The SARS-CoV-2 RNA is generally detectable in upper and lower  respiratory specimens during the acute phase of infection. The lowest  concentration of SARS-CoV-2 viral copies this assay can detect is 250  copies / mL. A negative result does not preclude SARS-CoV-2 infection  and should not be used as the sole basis for treatment or other  patient  management decisions.  A negative result may occur with  improper specimen collection / handling, submission of specimen other  than nasopharyngeal swab, presence of viral mutation(s) within the  areas targeted by this assay, and inadequate number of viral copies  (<250 copies / mL). A negative result must be combined with clinical  observations, patient history, and epidemiological information. If result is POSITIVE SARS-CoV-2 target nucleic acids are DETECTED. The SARS-CoV-2 RNA is generally detectable in upper and lower  respiratory specimens dur ing the acute phase of infection.  Positive  results are indicative of active infection with SARS-CoV-2.  Clinical  correlation with patient history and other diagnostic information is  necessary to determine patient infection status.  Positive results do  not rule out bacterial infection or co-infection with other viruses. If result is PRESUMPTIVE POSTIVE SARS-CoV-2 nucleic acids MAY BE PRESENT.   A presumptive positive result was obtained on the submitted specimen  and confirmed on repeat testing.  While 2019 novel coronavirus  (SARS-CoV-2) nucleic acids may be present in the submitted sample  additional confirmatory testing may be necessary for epidemiological  and / or clinical management purposes  to differentiate between  SARS-CoV-2 and other Sarbecovirus currently known to infect humans.  If clinically indicated additional testing with an alternate test  methodology [phone]) is advised. The SARS-CoV-2 RNA is generally  detectable in upper and lower respiratory sp ecimens during the acute  phase of infection. The expected result is Negative. Fact Sheet for Patients:  StrictlyIdeas.no Fact Sheet for Healthcare Providers: BankingDealers.co.za This test is not yet approved or cleared by the [place] FDA and has been authorized for detection and/or diagnosis of SARS-CoV-2 by FDA under  an Emergency Use Authorization (EUA).  This EUA will remain in effect (meaning this test can be used) for the duration of the COVID-19 declaration under Section 564(b)(1) of the Act, 21 U.S.C. section 360bbb-3(b)(1), unless the authorization is terminated or revoked sooner. Performed at [facility], [address] [place], [place] [address]   Blood Culture ID Panel (Reflexed)     Status: None   Collection Time: 09/19/18  3:36 PM  Result Value Ref Range Status   Enterococcus species NOT DETECTED NOT DETECTED Final   Listeria monocytogenes NOT DETECTED NOT DETECTED Final   Staphylococcus species NOT DETECTED NOT DETECTED Final   Staphylococcus aureus (BCID) NOT DETECTED NOT DETECTED Final   Streptococcus species NOT DETECTED NOT DETECTED Final   Streptococcus agalactiae NOT DETECTED NOT DETECTED Final   Streptococcus pneumoniae NOT DETECTED NOT DETECTED Final   Streptococcus pyogenes NOT DETECTED NOT DETECTED Final   Acinetobacter baumannii NOT DETECTED  NOT DETECTED Final   Enterobacteriaceae species NOT DETECTED NOT DETECTED Final   Enterobacter cloacae complex NOT DETECTED NOT DETECTED Final   Escherichia coli NOT DETECTED NOT DETECTED Final   Klebsiella oxytoca NOT DETECTED NOT DETECTED Final   Klebsiella pneumoniae NOT DETECTED NOT DETECTED Final   Proteus species NOT DETECTED NOT DETECTED Final   Serratia marcescens NOT DETECTED NOT DETECTED Final   Haemophilus influenzae NOT DETECTED NOT DETECTED Final   Neisseria meningitidis NOT DETECTED NOT DETECTED Final   Pseudomonas aeruginosa NOT DETECTED NOT DETECTED Final   Candida albicans NOT DETECTED NOT DETECTED Final   Candida glabrata NOT DETECTED NOT DETECTED Final   Candida krusei NOT DETECTED NOT DETECTED Final   Candida parapsilosis NOT DETECTED NOT DETECTED Final   Candida tropicalis NOT DETECTED NOT DETECTED Final    Comment: Performed at [facility], [address] [place], [place] [address]    CULTURE, BLOOD (ROUTINE X 2) w Reflex to ID Panel     Status: None   Collection Time: 09/21/18  1:00 PM   Specimen: BLOOD  Result Value Ref Range Status   Specimen Description BLOOD LEFT ANTECUBITAL  Final   Special Requests   Final    BOTTLES DRAWN AEROBIC AND ANAEROBIC Blood Culture adequate volume   Culture   Final    NO GROWTH 5 DAYS Performed at [facility], [address] [place], [place] [address]    Report Status 09/26/2018 FINAL  Final  CULTURE, BLOOD (ROUTINE X 2) w Reflex to ID Panel     Status: None   Collection Time: 09/21/18  1:00 PM   Specimen: BLOOD  Result Value Ref Range Status   Specimen Description BLOOD BLOOD RIGHT HAND  Final   Special Requests   Final    BOTTLES DRAWN AEROBIC ONLY Blood Culture results may not be optimal due to an inadequate volume of blood received in culture bottles   Culture   Final    NO GROWTH 5 DAYS Performed at [facility], [address], [place], [place] [address]    Report Status 09/26/2018 FINAL  Final   Studies/Results: Ct Abdomen Wo Contrast  Result Date: 09/28/2018 CLINICAL DATA:  Dysphagia. Evaluate anatomy prior to potential percutaneous gastrostomy tube placement EXAM: CT ABDOMEN WITHOUT CONTRAST TECHNIQUE: Multidetector CT imaging of the abdomen was performed following the standard protocol without IV contrast. COMPARISON:  CT abdomen pelvis-01/20/2017; abdominal ultrasound-09/20/2018 FINDINGS: Lack of intravenous contrast limits the ability to evaluate solid abdominal organs. Lower chest: Limited visualization of the lower thorax demonstrates small bilateral effusions with associated bibasilar consolidative opacities. Normal heart size. Coronary artery calcifications. Trace amount of pericardial fluid, presumably physiologic. There is diffuse decreased attenuation of the intra cardiac blood pool suggestive of anemia. Hepatobiliary: Normal hepatic contour. Scattered punctate granuloma are seen within the  liver. Normal noncontrast appearance of the gallbladder given degree distention. No radiopaque gallstones. No ascites. Pancreas: Normal noncontrast appearance of the pancreas. Spleen: Punctate granuloma within otherwise normal-appearing spleen. Adrenals/Urinary Tract: Note is made of 3 nonobstructing left-sided renal stones with dominant nonobstructing stone within the interpolar aspect of the left kidney measuring 1.1 x 0.7 cm (coronal image 46, series 5). Note is made of a punctate (approximately 3 mm) nonobstructing stone within the inferior pole of the right kidney (coronal image 9, series 5). Previously characterized bilateral renal cysts are grossly unchanged with dominant partially exophytic cyst arising from the inferior pole of the right kidney measuring approximately 8.0 x 6.1 cm  and is again noted to have thin peripheral mural calcifications, unchanged compared to contrast-enhanced CT scan performed 01/20/2017. No urinary obstruction. Normal noncontrast appearance of the bilateral adrenal glands. The urinary bladder was not imaged. Stomach/Bowel: The anterior wall of the stomach is well apposed against the ventral wall of the upper abdomen without interposed liver or transverse colon. No hiatal hernia. Ingested enteric contrast is seen within the colon. No definite evidence of enteric obstruction. No pneumoperitoneum, pneumatosis or portal venous gas. Vascular/Lymphatic: Atherosclerotic plaque within a normal caliber abdominal aorta. No bulky retroperitoneal, mesenteric, pelvic or inguinal lymphadenopathy on this noncontrast examination. Other: Regional soft tissues are normal. Musculoskeletal: No acute or aggressive osseous abnormalities. Stigmata of DISH within the thoracic spine. IMPRESSION: 1. Gastric anatomy amenable to attempted percutaneous gastrostomy tube placement as indicated. 2. Small bilateral effusions with associated basilar opacities - atelectasis versus infiltrate. 3. Sequela of prior  granulomatous infection as above. 4.  Aortic Atherosclerosis (ICD10-I70.0). Electronically Signed   By: Sandi Mariscal M.D.   On: 09/28/2018 16:48   Medications:  Scheduled Meds:  feeding supplement (ENSURE ENLIVE)  237 mL Oral TID BM   multivitamin with minerals  1 tablet Oral Daily   neomycin-bacitracin-polymyxin   Topical Daily   pantoprazole (PROTONIX) IV  40 mg Intravenous Q12H   vitamin C  250 mg Oral BID   Continuous Infusions:  dextrose     doxycycline (VIBRAMYCIN) IV 100 mg (09/29/18 1232)   PRN Meds:.HYDROcodone-acetaminophen, morphine injection, ondansetron **OR** ondansetron (ZOFRAN) IV, polyethylene glycol   Assessment: Active Problems:   Syncope   Blood in stool   Elevated transaminase level   Anemia   Thrombocytopenia (HCC)    Plan: Liver enzymes have continued to improve and are almost back to baseline at this time  Work-up has been unrevealing for any other causes besides possible hypotension causing elevation at the time of admission  Patient awaiting feeding tube evaluation by IR as patient is high risk for endoscopic procedures given recent subdural hemorrhage on this admission  His dysphagia was not present prior to the admission and suspect that the dysphagia is possibly due to his subdural hemorrhage.  This may improve over time.  This was discussed with Dr. Posey Pronto as well.  GI service will sign off at this time, please page with any questions or concerns  Follow-up in GI clinic in 3 to 4 weeks after discharge   LOS: 10 days   Glenn Antigua, MD 09/29/2018, 2:22 PM

## 2018-09-29 NOTE — Progress Notes (Addendum)
Pt bladder scan showed 0 amount. Will continue to monitor.  Update 0000: pt bladder scan showed 58. Will continue to monitor.

## 2018-09-30 DIAGNOSIS — R1310 Dysphagia, unspecified: Secondary | ICD-10-CM

## 2018-09-30 LAB — PROTIME-INR
INR: 1.8 — ABNORMAL HIGH (ref 0.8–1.2)
Prothrombin Time: 20.8 seconds — ABNORMAL HIGH (ref 11.4–15.2)

## 2018-09-30 LAB — BASIC METABOLIC PANEL
Anion gap: 5 (ref 5–15)
BUN: 12 mg/dL (ref 8–23)
CO2: 23 mmol/L (ref 22–32)
Calcium: 7.5 mg/dL — ABNORMAL LOW (ref 8.9–10.3)
Chloride: 105 mmol/L (ref 98–111)
Creatinine, Ser: 0.66 mg/dL (ref 0.61–1.24)
GFR calc Af Amer: >60 mL/min (ref >60)
GFR calc non Af Amer: >60 mL/min (ref >60)
Glucose, Bld: 103 mg/dL — ABNORMAL HIGH (ref 70–99)
Potassium: 3.6 mmol/L (ref 3.5–5.1)
Sodium: 133 mmol/L — ABNORMAL LOW (ref 135–145)

## 2018-09-30 LAB — VARICELLA-ZOSTER BY PCR: Varicella-Zoster, PCR: NEGATIVE

## 2018-09-30 LAB — MAGNESIUM: Magnesium: 2 mg/dL (ref 1.7–2.4)

## 2018-09-30 LAB — GLUCOSE, CAPILLARY: Glucose-Capillary: 103 mg/dL — ABNORMAL HIGH (ref 70–99)

## 2018-09-30 MED ORDER — DEXTROSE 5 % IV SOLN
INTRAVENOUS | Status: AC
  Administered 2018-09-30: 21:00:00 via INTRAVENOUS

## 2018-09-30 MED ORDER — CEFAZOLIN SODIUM-DEXTROSE 2-4 GM/100ML-% IV SOLN
2.0000 g | Freq: Once | INTRAVENOUS | Status: AC
  Administered 2018-10-01: 2 g via INTRAVENOUS
  Filled 2018-09-30 (×2): qty 100

## 2018-09-30 NOTE — Progress Notes (Signed)
Notify Gardiner Barefoot NP if patient's blood sugar needs to be check since he has D5 running, order was place to check CBG q6. RN will continue to monitor.

## 2018-09-30 NOTE — Progress Notes (Addendum)
SLP Cancellation Note  Patient Details Name: [name] MRN: [id number] DOB: 26-Mar-1934   Cancelled treatment:       Reason Eval/Treat Not Completed: (chart reviewed). Pt has not been able to have the PEG placement as of yet and remains NPO; it is scheduled by GI for tomorrow. Pt is then d/t discharge to [facility] for Rehab.  Recommend Rehab f/u at next venue of care to address pt's Oropharyngeal phase dysphagia including pharyngeal swallowing exercises targeting hyolaryngeal excursion for a more effective and safe pharyngeal swallow - as his strength and stamina for exercises improve w/ nutrition via the PEG. Recommend ongoing frequent oral care for hygiene and stimulation of swallowing.  ST services available for any further education while admitted.   Orinda Kenner, MS, CCC-SLP Watson,Katherine 09/30/2018, 3:49 PM

## 2018-09-30 NOTE — Consult Note (Signed)
PHARMACY CONSULT NOTE - FOLLOW UP  Pharmacy Consult for Electrolyte Monitoring and Replacement   Recent Labs: Potassium (mmol/L)  Date Value  09/30/2018 3.6   Magnesium (mg/dL)  Date Value  09/30/2018 2.0   Calcium (mg/dL)  Date Value  09/30/2018 7.5 (L)   Albumin (g/dL)  Date Value  09/29/2018 2.4 (L)   Phosphorus (mg/dL)  Date Value  09/29/2018 2.5   Sodium (mmol/L)  Date Value  09/30/2018 133 (L)   Corrected Ca: 9.4   Assessment: Patient admitted with  Hypokalemia. Pharmacy consulted for electrolyte monitoring and replacement. Patient unable to take oral replacement at this time. Corrected Ca is 9.1.   Goal of Therapy:  Electrolytes WNL  Plan:  Patient receiving NaCl w/KCL 57mEq @ 75 mL/hr - now d/c'ed .  No replacement needed at this time.     Will F/U with AM labs and continue to replace electrolytes as needed.   Oswald Hillock, PharmD, BCPS Clinical Pharmacist 09/30/2018 7:43 AM

## 2018-09-30 NOTE — Progress Notes (Signed)
The patient was previously seen by my partner for possible PEG.  The patient is unable to have a PEG in a timely fashion by our interventional radiologist.  The patient was seen today and scheduled to have his PEG tube placed endoscopically today but anesthesia could not accommodate.  The patient will be set up for a PEG tomorrow.  The patient has agreed to it after being told the risks and benefits including infection perforation bleeding and death.  I have also spoken to his daughter who agrees with the plan.

## 2018-09-30 NOTE — Progress Notes (Signed)
Advanced care plan.  Patient's daughter requested this evaluation and conversation  Purpose of the Encounter: CODE STATUS  Parties in Attendance: Patient himself and daughter over the phone  Patient's Decision Capacity: Intact  Subjective/Patient's story:  Patient is 85 year old who is admitted with the syncope noted to have subdural hematoma and a stroke who is awaiting PEG tube placement.  Patient had previously stated that he wanted a full code.  With his conditioning not improving.    Objective/Medical story I discussed with the patient and and his wife regarding his desires for cardiac and pulmonary resuscitation.   Goals of care determination:   Patient states that she wants to be a DO NOT RESUSCITATE DO NOT INTUBATE.  CODE STATUS: I will change his CODE STATUS to DO NOT RESUSCITATE.   Time spent discussing advanced care planning: 16 minutes

## 2018-09-30 NOTE — Progress Notes (Signed)
PT Cancellation Note  Patient Details Name: [name] MRN: [id number] DOB: [birth year]   Cancelled Treatment:    Reason Eval/Treat Not Completed: Other (comment)   Offered and encouraged session.  Pt declined stating he did not feel like it today.  Will continue as appropriate.   Chesley Noon 09/30/2018, 1:38 PM

## 2018-09-30 NOTE — Progress Notes (Signed)
Ch f/u with pt and daughter. Ch spoke briefly w/ daughter via telephone. Daughter is concerned about pt being full code. Ch asked guided questions and explored the medical and ethical dilemma regarding the pt's code status. Daughter was informed by ch that since the pt is still mentally capable of making his own medical decisions, it would be up to him to decide but the ch would be in contact with the care team to ensure pt has been educated on his options.   Ch f/u with pt who presented to have a moderate affect yet was able to converse about his GOC. Pt understands that he will be transitioning to [place] upon d/c but felt OK about it considering the ex-wife was there. Pt shared that he is worried about his daughter but knows he needs to gain his strength back. Ch provided a compassionate presence while acknowledging that this hospitalization has been overwhelming for the pt. Ch dialed the number for the pt's daughter upon exiting the room and asked the pt to let his daughter know that the ch had visited him as a f/u.   Goals: Ch will be in contact w/ care team members to determine the pt's [place] and if he has been educated on his code status and the care of the PEG tube post-op.    09/30/18 1100  Clinical Encounter Type  Visited With Patient;Health care provider;Family  Visit Type Follow-up;Psychological support;Spiritual support;Social support  Referral From Chaplain  Consult/Referral To Chaplain  Recommendations f/ u with provider to determine if pt desire change to code status   Spiritual Encounters  Spiritual Needs Emotional;Grief support  Stress Factors  Patient Stress Factors Family relationships;Health changes;Loss of control;Major life changes  Family Stress Factors Loss of control;Major life changes  Advance Directives (For Healthcare)  Does Patient Have a Medical Advance Directive? Yes  Type of Advance Directive [place]  in Chart? No - copy requested

## 2018-09-30 NOTE — TOC Progression Note (Addendum)
Transition of Care [facility]) - Progression Note    Patient Details  Name: [name] MRN: [id number] Date of Birth: 04/07/1933  [facility] CM/SW Contact  Ross Ludwig, [place] Phone Number: 09/30/2018, 4:55 PM  Clinical Narrative:     CSW spoke with physician and bedside nurse, who informed this CSW that patient will be having a peg tube inserted on Saturday.  CSW updated [facility] and asked if they are able to accept patient over the weekend if he is medically ready, and [facility] said yes just to let them know.  [facility] contacted insurance company who extended authorization to Monday.  Patient's authorization is good through Monday.  If patient needs to stay at hospital longer, [facility] will have to get reauthorization.  Patient will have a to have another negative Covid within three days of discharge.  CSW to continue to follow patient's progress throughout discharge planning.   Expected Discharge Plan: Skilled Nursing Facility Barriers to Discharge: Continued Medical Work up  Expected Discharge Plan and Services Expected Discharge Plan: [place]   Discharge Planning Services: CM Consult                                           Social Determinants of Health (SDOH) Interventions    Readmission Risk Interventions Readmission Risk Prevention Plan 09/21/2018  Post Dischage Appt Complete  Medication Screening Complete  Transportation Screening Complete  Some recent data might be hidden

## 2018-09-30 NOTE — Progress Notes (Signed)
[facility] NAME: Jobany Montellano    MR#:  [id number]  DATE OF BIRTH:  February 21, 1934  SUBJECTIVE:  Discussed with interventional radiology yesterday they are unable to put the PEG tube in GI to see if they are able to put a PEG tube and they will be able to do tomorrow   REVIEW OF SYSTEMS:   Review of Systems  Constitutional: Negative for chills, fever and weight loss.  HENT: Negative for ear discharge, ear pain and nosebleeds.   Eyes: Negative for blurred vision, pain and discharge.  Respiratory: Negative for sputum production, shortness of breath, wheezing and stridor.   Cardiovascular: Negative for chest pain, palpitations, orthopnea and PND.  Gastrointestinal: Negative for abdominal pain, diarrhea, melena, nausea and vomiting.  Genitourinary: Negative for frequency and urgency.  Musculoskeletal: Negative for back pain, falls and joint pain.  Neurological: Positive for weakness. Negative for sensory change, speech change and focal weakness.  Psychiatric/Behavioral: Negative for depression and hallucinations. The patient is not nervous/anxious.      DRUG ALLERGIES:  No Known Allergies  VITALS:  Blood pressure 134/90, pulse 87, temperature 98 F (36.7 C), temperature source Oral, resp. rate 19, height 6' (1.829 m), weight 60.5 kg, SpO2 99 %.  PHYSICAL EXAMINATION:   Physical Exam  GENERAL:  85 y.o.-year-old patient lying in the bed with no acute distress.  EYES: Pupils equal, round, reactive to light and accommodation. No scleral icterus. Extraocular muscles intact.  HEENT: Head atraumatic, normocephalic. Oropharynx and nasopharynx clear.  Bruising on his forehead NECK:  Supple, no jugular venous distention. No thyroid enlargement, no tenderness.  LUNGS: Normal breath sounds bilaterally, no wheezing, rales, rhonchi. No use of accessory muscles of respiration.  CARDIOVASCULAR: S1, S2 normal. No murmurs, rubs, or  gallops.  ABDOMEN: Soft, nontender, nondistended. Bowel sounds present. No organomegaly or mass.  EXTREMITIES: No cyanosis, clubbing or edema b/l.    NEUROLOGIC: Cranial nerves II through XII are intact. No focal Motor or sensory deficits b/l.  Overall weak, mild dysarthria PSYCHIATRIC:  patient is alert and oriented x 3.  SKIN: No obvious rash, lesion, or ulcer.   LABORATORY PANEL:  CBC Recent Labs  Lab 09/29/18 0432  WBC 7.9  HGB 8.7*  HCT 27.0*  PLT 334    Chemistries  Recent Labs  Lab 09/29/18 0432 09/30/18 0647  NA 138 133*  K 3.8 3.6  CL 109 105  CO2 24 23  GLUCOSE 107* 103*  BUN 14 12  CREATININE 0.69 0.66  CALCIUM 7.7* 7.5*  MG 1.7 2.0  AST 46*  --   ALT 52*  --   ALKPHOS 80  --   BILITOT 1.3*  --    Cardiac Enzymes No results for input(s): TROPONINI in the last 168 hours. RADIOLOGY:  Ct Abdomen Wo Contrast  Result Date: 09/28/2018 CLINICAL DATA:  Dysphagia. Evaluate anatomy prior to potential percutaneous gastrostomy tube placement EXAM: CT ABDOMEN WITHOUT CONTRAST TECHNIQUE: Multidetector CT imaging of the abdomen was performed following the standard protocol without IV contrast. COMPARISON:  CT abdomen pelvis-01/20/2017; abdominal ultrasound-09/20/2018 FINDINGS: Lack of intravenous contrast limits the ability to evaluate solid abdominal organs. Lower chest: Limited visualization of the lower thorax demonstrates small bilateral effusions with associated bibasilar consolidative opacities. Normal heart size. Coronary artery calcifications. Trace amount of pericardial fluid, presumably physiologic. There is diffuse decreased attenuation of the intra cardiac blood pool suggestive of anemia. Hepatobiliary: Normal hepatic contour. Scattered punctate granuloma are seen within  the liver. Normal noncontrast appearance of the gallbladder given degree distention. No radiopaque gallstones. No ascites. Pancreas: Normal noncontrast appearance of the pancreas. Spleen: Punctate  granuloma within otherwise normal-appearing spleen. Adrenals/Urinary Tract: Note is made of 3 nonobstructing left-sided renal stones with dominant nonobstructing stone within the interpolar aspect of the left kidney measuring 1.1 x 0.7 cm (coronal image 46, series 5). Note is made of a punctate (approximately 3 mm) nonobstructing stone within the inferior pole of the right kidney (coronal image 9, series 5). Previously characterized bilateral renal cysts are grossly unchanged with dominant partially exophytic cyst arising from the inferior pole of the right kidney measuring approximately 8.0 x 6.1 cm and is again noted to have thin peripheral mural calcifications, unchanged compared to contrast-enhanced CT scan performed 01/20/2017. No urinary obstruction. Normal noncontrast appearance of the bilateral adrenal glands. The urinary bladder was not imaged. Stomach/Bowel: The anterior wall of the stomach is well apposed against the ventral wall of the upper abdomen without interposed liver or transverse colon. No hiatal hernia. Ingested enteric contrast is seen within the colon. No definite evidence of enteric obstruction. No pneumoperitoneum, pneumatosis or portal venous gas. Vascular/Lymphatic: Atherosclerotic plaque within a normal caliber abdominal aorta. No bulky retroperitoneal, mesenteric, pelvic or inguinal lymphadenopathy on this noncontrast examination. Other: Regional soft tissues are normal. Musculoskeletal: No acute or aggressive osseous abnormalities. Stigmata of DISH within the thoracic spine. IMPRESSION: 1. Gastric anatomy amenable to attempted percutaneous gastrostomy tube placement as indicated. 2. Small bilateral effusions with associated basilar opacities - atelectasis versus infiltrate. 3. Sequela of prior granulomatous infection as above. 4.  Aortic Atherosclerosis (ICD10-I70.0). Electronically Signed   By: Sandi Mariscal M.D.   On: 09/28/2018 16:48   ASSESSMENT AND PLAN:   85 year old male with  history of hypertension who has had decreased appetite for several weeks who presents after multiple falls today and syncopal episode.  1. 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  They state that there will be no one available here to do PEG tube until Wednesday.  I rediscussed the case with Dr. Allen Norris of GI who is graciously agreed to proceed with the PEG tube placement.    2.  Acute kidney injury in the setting of dehydration:  -Hold nephrotoxic medications including blood pressure medications  - received IV fluids.  -Baseline creatinine 0.8 in October 2018 -Renal function normal -Foley in place  3.  Urinary retention continue Foley  4.  Hypernatremia due to free water deficit continue D5W  5  Left Subdural Hematoma  S/p fall at home (delayed effects seen on MRI brain) with leftfrontal cortex CVA/infarct -hold ASA -seen by Dr Cari Caraway -Repeat CT head 09/20/18-- stable hematoma -MBS shows patient with severe dysphasia  6  Elevated liver function,LDH low platelet count and anemia - ultrasound abdomen-- no hepatosplenomegaly -seen by Dr Tasia Catchings -Positive EBV discussed with ID no further treatment needed   7.  Fever: etio unclear ?viral -BC 1/4 GPR --bacillus species -d/c vanc, cefepime Chest x-ray and UA are unremarkable. -UC neg -COVID-19 on admission neg -tmax 103--now afebrile -ID consult appreciated USG abdomen--no acute abnormality -cont doxy for possible tick bite illness stop date tomorrow  8.  Hypertension Continue  9 HYpokalemia: Replace  10. History of prostate cancer -pelvic  x-rays  Shows diffuse osteopenia  11.Acute on Chronic anemia with  melena and left subdural hematoma -GI saw pt--colonoscopy as out pt (pt preference) -EGD 11/2017--Barretts esophagus -Hemoglobin stable   CODE STATUS: full  DVT Prophylaxis: SCD  TOTAL TIME TAKING CARE OF THIS PATIENT: *30* minutes.  >50% time spent on counselling and coordination of care  POSSIBLE D/C IN 2-3 DAYS, DEPENDING ON CLINICAL CONDITION.  Note: This dictation was prepared with Dragon dictation along with smaller phrase technology. Any transcriptional errors that result from this process are unintentional.  Dustin Flock M.D on 09/30/2018 at 1:38 PM  Between 7am to 6pm - Pager - [phone]  After 6pm go to [email or web address] - password [email or web address] [facility]  Office  [phone]  CC: Primary care physician; [name], MDPatient ID: Mindi Junker, male   DOB: 14-Jun-1933, 85 y.o.   MRN: [id number]

## 2018-09-30 NOTE — Progress Notes (Signed)
Notify Dr. Allen Norris if patient need to be tested for rapid covid test because the order place for covid will take 3-5 days for the result to come back. Per MD he will need to have the rapid test. RN will continue to monitor.

## 2018-09-30 NOTE — Plan of Care (Signed)
  Problem: Education: Goal: Knowledge of General Education information will improve Description: Including pain rating scale, medication(s)/side effects and non-pharmacologic comfort measures Outcome: Progressing   Problem: Elimination: Goal: Will not experience complications related to bowel motility Outcome: Progressing   Problem: Safety: Goal: Ability to remain free from injury will improve Outcome: Progressing   

## 2018-09-30 NOTE — Progress Notes (Signed)
Nutrition Follow Up Note   DOCUMENTATION CODES:   Underweight  INTERVENTION:   Once PEG tube placed, recommend:  Osmolite 1.5 @ goal rate of 63m/hr- Initiate at 261mhr and increase by 1031mr q 8 hours until goal rate is reached.   Recommend free water flushes 64m48m4 hours.  Regimen provides 1980kcal/day, 83g/day protein, 1456ml31m free water   Vitamin C 250mg 22mvia tube   Pt at high refeed risk; recommend monitor K, Mg and P labs daily  If PEG tube is not able to be placed tomorrow, recommend temporary NGT placement and tube feeds until PEG can be placed.    NUTRITION DIAGNOSIS:   Inadequate oral intake related to acute illness as evidenced by meal completion < 50%. -pt NPO  GOAL:   Patient will meet greater than or equal to 90% of their needs  -not met   MONITOR:   Labs, Weight trends, Skin, I & O's, Diet advancement  ASSESSMENT:   84 yea34old male with history of hypertension who has had decreased appetite for several weeks who presents after multiple falls today and syncopal episode.  RD working remotely.  Pt has been unable to have PEG tube placed yet; pt is now without adequate nutrition for > 10 days. Plan is for PEG tube placement tomorrow. Pt is at high refeed risk. If PEG tube is not able to be placed tomorrow, recommend temporary NGT placement and tube feeds until PEG can be placed. No new weight since 6/25; will request weekly weights.   Medications reviewed and include: MVI, protonix, vitamin C  Labs reviewed: Na 133(L), K 3.6 wnl, Mg 2.0 wnl P 2.5 wnl- 7/2 Hgb 8.7(L), Hct 27.0(L)- 7/2  Diet Order:   Diet Order            Diet NPO time specified  Diet effective now             EDUCATION NEEDS:   No education needs have been identified at this time  Skin:  Skin Assessment: Reviewed RN Assessment  Last BM:  6/27  Height:   Ht Readings from Last 1 Encounters:  09/19/18 6' (1.829 m)    Weight:   Wt Readings from Last 1  Encounters:  09/22/18 60.5 kg    Ideal Body Weight:  80.9 kg  BMI:  Body mass index is 18.09 kg/m.  Estimated Nutritional Needs:   Kcal:  1700-2000kcal/day  Protein:  85-100g/day  Fluid:  >1.5L/day  Mishayla Sliwinski Koleen DistanceD, LDN Pager #- [phone]#- [phone] Hours Pager: [phone]

## 2018-10-01 ENCOUNTER — Encounter
Admission: EM | Disposition: A | Discharge status: Skilled Nursing Facility | Payer: Self-pay | Source: Home / Self Care | Attending: Internal Medicine

## 2018-10-01 ENCOUNTER — Inpatient Hospital Stay: Payer: Medicare Other | Admitting: Anesthesiology

## 2018-10-01 ENCOUNTER — Encounter: Payer: Self-pay | Admitting: Anesthesiology

## 2018-10-01 LAB — PROTIME-INR
INR: 2 — ABNORMAL HIGH (ref 0.8–1.2)
Prothrombin Time: 22.2 seconds — ABNORMAL HIGH (ref 11.4–15.2)

## 2018-10-01 LAB — GLUCOSE, CAPILLARY
Glucose-Capillary: 105 mg/dL — ABNORMAL HIGH (ref 70–99)
Glucose-Capillary: 101 mg/dL — ABNORMAL HIGH (ref 70–99)
Glucose-Capillary: 101 mg/dL — ABNORMAL HIGH (ref 70–99)
Glucose-Capillary: 109 mg/dL — ABNORMAL HIGH (ref 70–99)
Glucose-Capillary: 102 mg/dL — ABNORMAL HIGH (ref 70–99)

## 2018-10-01 LAB — SARS CORONAVIRUS 2 BY RT PCR (HOSPITAL ORDER, PERFORMED IN ~~LOC~~ HOSPITAL LAB): SARS Coronavirus 2: NEGATIVE

## 2018-10-01 LAB — POTASSIUM: Potassium: 3.4 mmol/L — ABNORMAL LOW (ref 3.5–5.1)

## 2018-10-01 SURGERY — INSERTION, PEG TUBE
Anesthesia: General

## 2018-10-01 MED ORDER — DEXTROSE 5 % IV SOLN
INTRAVENOUS | Status: AC
  Administered 2018-10-02 (×2): via INTRAVENOUS

## 2018-10-01 MED ORDER — SODIUM CHLORIDE 0.9 % IV SOLN
INTRAVENOUS | Status: DC
  Administered 2018-10-01: 1000 mL via INTRAVENOUS

## 2018-10-01 MED ORDER — ONDANSETRON HCL 4 MG/2ML IJ SOLN: 4.0000 mg | Freq: Once | INTRAMUSCULAR | Status: DC | PRN

## 2018-10-01 MED ORDER — LIDOCAINE HCL (PF) 2 % IJ SOLN
INTRAMUSCULAR | Status: AC
  Filled 2018-10-01: qty 10

## 2018-10-01 MED ORDER — PROPOFOL 500 MG/50ML IV EMUL
INTRAVENOUS | Status: AC
  Filled 2018-10-01: qty 50

## 2018-10-01 MED ORDER — FENTANYL CITRATE (PF) 100 MCG/2ML IJ SOLN: 25.0000 ug | INTRAMUSCULAR | Status: DC | PRN

## 2018-10-01 MED ORDER — POTASSIUM CHLORIDE 10 MEQ/100ML IV SOLN
10.0000 meq | Freq: Once | INTRAVENOUS | Status: AC
  Administered 2018-10-01: 10 meq via INTRAVENOUS
  Filled 2018-10-01: qty 100

## 2018-10-01 MED ORDER — SODIUM CHLORIDE 0.9% IV SOLUTION
Freq: Once | INTRAVENOUS | Status: AC
  Administered 2018-10-01: 18:00:00 via INTRAVENOUS

## 2018-10-01 NOTE — Progress Notes (Signed)
Patient is refusing the dobhoff NG tube to be place. Lastnight he had a covid testing done for his procedure today and he did not tolerate that well. Explained and educated about the importance of having it since they cancelled the peg tube placement. MD ordered the NG for nutrition and for medication administration. Per patient he will need time to think about it. When I come back an hour after giving him time to think, he still refuses the idea of having it. Notify Dr. Posey Pronto about patient's refusal. Patient is still in D5 IVF. No other concern at the moment. RN will continue to monitor.

## 2018-10-01 NOTE — Progress Notes (Signed)
Glenn Lame, MD [facility]   [address] [place], [place] [address] Phone: [phone] Fax : [phone]   Subjective: The patient was brought down for an EGD with a PEG tube placement today.  The patient INR was noted to be higher than it had previously been.  This had gone from 1.3-1.8.  The patient had a stat INR sent from the Endo unit which showed the INR to go up to 2.0.   Objective: Vital signs in last 24 hours: Vitals:   09/30/18 2005 10/01/18 0548 10/01/18 0733 10/01/18 1012  BP: 138/84 140/90 (!) 135/92 129/89  Pulse: 94 89 91 89  Resp: 18 18 20 20   Temp: 98 F (36.7 C) 97.9 F (36.6 C) 98.2 F (36.8 C) 99.2 F (37.3 C)  TempSrc: Oral Oral Oral Tympanic  SpO2: 100% 100% 100% 100%  Weight:      Height:       Weight change:   Intake/Output Summary (Last 24 hours) at 10/01/2018 1059 Last data filed at 10/01/2018 0548 Gross per 24 hour  Intake 517.51 ml  Output 850 ml  Net -332.49 ml     Exam: Heart:: Regular rate and rhythm, S1S2 present or without murmur or extra heart sounds Lungs: normal and clear to auscultation and percussion Abdomen: soft, nontender, normal bowel sounds   Lab Results: @LABTEST2 @ Micro Results: Recent Results (from the past 240 hour(s))  CULTURE, BLOOD (ROUTINE X 2) w Reflex to ID Panel     Status: None   Collection Time: 09/21/18  1:00 PM   Specimen: BLOOD  Result Value Ref Range Status   Specimen Description BLOOD LEFT ANTECUBITAL  Final   Special Requests   Final    BOTTLES DRAWN AEROBIC AND ANAEROBIC Blood Culture adequate volume   Culture   Final    NO GROWTH 5 DAYS Performed at [facility], [address] [place], [place] [address]    Report Status 09/26/2018 FINAL  Final  CULTURE, BLOOD (ROUTINE X 2) w Reflex to ID Panel     Status: None   Collection Time: 09/21/18  1:00 PM   Specimen: BLOOD  Result Value Ref Range Status   Specimen Description BLOOD BLOOD RIGHT HAND  Final   Special Requests   Final     BOTTLES DRAWN AEROBIC ONLY Blood Culture results may not be optimal due to an inadequate volume of blood received in culture bottles   Culture   Final    NO GROWTH 5 DAYS Performed at [facility], [address] [place], [place] [address]    Report Status 09/26/2018 FINAL  Final  SARS Coronavirus 2 (CEPHEID - Performed in [facility] hospital lab), Hosp Order     Status: None   Collection Time: 09/30/18 11:42 PM   Specimen: Nasopharyngeal Swab  Result Value Ref Range Status   SARS Coronavirus 2 NEGATIVE NEGATIVE Final    Comment: (NOTE) If result is NEGATIVE SARS-CoV-2 target nucleic acids are NOT DETECTED. The SARS-CoV-2 RNA is generally detectable in upper and lower  respiratory specimens during the acute phase of infection. The lowest  concentration of SARS-CoV-2 viral copies this assay can detect is 250  copies / mL. A negative result does not preclude SARS-CoV-2 infection  and should not be used as the sole basis for treatment or other  patient management decisions.  A negative result may occur with  improper specimen collection / handling, submission of specimen other  than nasopharyngeal swab, presence of viral mutation(s)  within the  areas targeted by this assay, and inadequate number of viral copies  (<250 copies / mL). A negative result must be combined with clinical  observations, patient history, and epidemiological information. If result is POSITIVE SARS-CoV-2 target nucleic acids are DETECTED. The SARS-CoV-2 RNA is generally detectable in upper and lower  respiratory specimens dur ing the acute phase of infection.  Positive  results are indicative of active infection with SARS-CoV-2.  Clinical  correlation with patient history and other diagnostic information is  necessary to determine patient infection status.  Positive results do  not rule out bacterial infection or co-infection with other viruses. If result is PRESUMPTIVE POSTIVE SARS-CoV-2 nucleic  acids MAY BE PRESENT.   A presumptive positive result was obtained on the submitted specimen  and confirmed on repeat testing.  While 2019 novel coronavirus  (SARS-CoV-2) nucleic acids may be present in the submitted sample  additional confirmatory testing may be necessary for epidemiological  and / or clinical management purposes  to differentiate between  SARS-CoV-2 and other Sarbecovirus currently known to infect humans.  If clinically indicated additional testing with an alternate test  methodology [phone]) is advised. The SARS-CoV-2 RNA is generally  detectable in upper and lower respiratory sp ecimens during the acute  phase of infection. The expected result is Negative. Fact Sheet for Patients:  StrictlyIdeas.no Fact Sheet for Healthcare Providers: BankingDealers.co.za This test is not yet approved or cleared by the [place] FDA and has been authorized for detection and/or diagnosis of SARS-CoV-2 by FDA under an Emergency Use Authorization (EUA).  This EUA will remain in effect (meaning this test can be used) for the duration of the COVID-19 declaration under Section 564(b)(1) of the Act, 21 U.S.C. section 360bbb-3(b)(1), unless the authorization is terminated or revoked sooner. Performed at [facility], [address], [place], [place] [address]    Studies/Results: No results found. Medications: I have reviewed the patient's current medications. Scheduled Meds: . [MAR Hold] feeding supplement (ENSURE ENLIVE)  237 mL Oral TID BM  . [MAR Hold] multivitamin with minerals  1 tablet Oral Daily  . [MAR Hold] neomycin-bacitracin-polymyxin   Topical Daily  . [MAR Hold] pantoprazole (PROTONIX) IV  40 mg Intravenous Q12H  . [facility] Hold] vitamin C  250 mg Oral BID   Continuous Infusions: . sodium chloride 1,000 mL (10/01/18 1049)  . dextrose 75 mL/hr at 09/30/18 2105   PRN Meds:.fentaNYL (SUBLIMAZE) injection, [MAR  Hold] HYDROcodone-acetaminophen, [MAR Hold]  morphine injection, [MAR Hold] ondansetron **OR** [MAR Hold] ondansetron (ZOFRAN) IV, ondansetron (ZOFRAN) IV, [MAR Hold] polyethylene glycol   Assessment: Active Problems:   Syncope   Blood in stool   Elevated transaminase level   Anemia   Thrombocytopenia ([place])   Dysphagia    Plan: The patient's INR is too high to do a procedure today I have spoken to the hospitalist who will try to give him FFP and recheck the INR.  When the INR is below 1.5 then we can consider doing the PEG tube.   LOS: 12 days   [name] 10/01/2018, 10:59 AM

## 2018-10-01 NOTE — Progress Notes (Signed)
Talked to patient's daughter [name] and updated her about the plan of care for this patient and also patient's refusal to place NG tube.

## 2018-10-01 NOTE — Anesthesia Preprocedure Evaluation (Deleted)
Anesthesia Evaluation  Patient identified by MRN, date of birth, ID band Patient awake    Reviewed: Allergy & Precautions, NPO status , Patient's Chart, lab work & pertinent test results, reviewed documented beta blocker date and time   Airway Mallampati: II  TM Distance: >3 FB     Dental  (+) Chipped   Pulmonary former smoker,           Cardiovascular hypertension, Pt. on medications      Neuro/Psych    GI/Hepatic   Endo/Other    Renal/GU      Musculoskeletal  (+) Arthritis ,   Abdominal   Peds  Hematology  (+) anemia ,   Anesthesia Other Findings Dec platelets. INR elevated. EF 60. EKG reviewed. DNR.  Reproductive/Obstetrics                             Anesthesia Physical Anesthesia Plan  ASA: IV  Anesthesia Plan: General   Post-op Pain Management:    Induction: Intravenous  PONV Risk Score and Plan:   Airway Management Planned:   Additional Equipment:   Intra-op Plan:   Post-operative Plan:   Informed Consent: I have reviewed the patients History and Physical, chart, labs and discussed the procedure including the risks, benefits and alternatives for the proposed anesthesia with the patient or authorized representative who has indicated his/her understanding and acceptance.       Plan Discussed with: CRNA  Anesthesia Plan Comments:         Anesthesia Quick Evaluation

## 2018-10-01 NOTE — Consult Note (Signed)
PHARMACY CONSULT NOTE - FOLLOW UP  Pharmacy Consult for Electrolyte Monitoring and Replacement   Recent Labs: Potassium (mmol/L)  Date Value  10/01/2018 3.4 (L)   Magnesium (mg/dL)  Date Value  09/30/2018 2.0   Calcium (mg/dL)  Date Value  09/30/2018 7.5 (L)   Albumin (g/dL)  Date Value  09/29/2018 2.4 (L)   Phosphorus (mg/dL)  Date Value  09/29/2018 2.5   Sodium (mmol/L)  Date Value  09/30/2018 133 (L)   Corrected Ca: 9.1 mg/dL   Assessment: Patient admitted with  Hypokalemia. Pharmacy consulted for electrolyte monitoring and replacement. Patient unable to take oral replacement at this time.    Goal of Therapy:  Electrolytes WNL  Plan:   Patient receiving D5 @ 75 mL/hr    Replace potassium with 10 mEq IV KCl    Will F/U with AM labs and continue to replace electrolytes as needed.   Dallie Piles, PharmD Clinical Pharmacist 10/01/2018 7:23 AM

## 2018-10-01 NOTE — Progress Notes (Signed)
[facility] at [place] NAME: [name]    MR#:  [id number]  DATE OF BIRTH:  04-01-1933  SUBJECTIVE:  Patient's INR is elevated he was supposed to have a feeding tube placed however due to INR being greater than 1.5 unable to have this done.  REVIEW OF SYSTEMS:   Review of Systems  Constitutional: Negative for chills, fever and weight loss.  HENT: Negative for ear discharge, ear pain and nosebleeds.   Eyes: Negative for blurred vision, pain and discharge.  Respiratory: Negative for sputum production, shortness of breath, wheezing and stridor.   Cardiovascular: Negative for chest pain, palpitations, orthopnea and PND.  Gastrointestinal: Negative for abdominal pain, diarrhea, melena, nausea and vomiting.  Genitourinary: Negative for frequency and urgency.  Musculoskeletal: Negative for back pain, falls and joint pain.  Neurological: Positive for weakness. Negative for sensory change, speech change and focal weakness.  Psychiatric/Behavioral: Negative for depression and hallucinations. The patient is not nervous/anxious.      DRUG ALLERGIES:  No Known Allergies  VITALS:  Blood pressure 129/89, pulse 89, temperature 99.2 F (37.3 C), temperature source Tympanic, resp. rate 20, height 6' (1.829 m), weight 60.5 kg, SpO2 100 %.  PHYSICAL EXAMINATION:   Physical Exam  GENERAL:  85 y.o.-year-old patient lying in the bed with no acute distress.  EYES: Pupils equal, round, reactive to light and accommodation. No scleral icterus. Extraocular muscles intact.  HEENT: Head atraumatic, normocephalic. Oropharynx and nasopharynx clear.  Bruising on his forehead NECK:  Supple, no jugular venous distention. No thyroid enlargement, no tenderness.  LUNGS: Normal breath sounds bilaterally, no wheezing, rales, rhonchi. No use of accessory muscles of respiration.  CARDIOVASCULAR: S1, S2 normal. No murmurs, rubs, or gallops.  ABDOMEN: Soft,  nontender, nondistended. Bowel sounds present. No organomegaly or mass.  EXTREMITIES: No cyanosis, clubbing or edema b/l.    NEUROLOGIC: Cranial nerves II through XII are intact. No focal Motor or sensory deficits b/l.  Overall weak, mild dysarthria PSYCHIATRIC:  patient is alert and oriented x 3.  SKIN: No obvious rash, lesion, or ulcer.   LABORATORY PANEL:  CBC Recent Labs  Lab 09/29/18 0432  WBC 7.9  HGB 8.7*  HCT 27.0*  PLT 334    Chemistries  Recent Labs  Lab 09/29/18 0432 09/30/18 0647 10/01/18 0402  NA 138 133*  --   K 3.8 3.6 3.4*  CL 109 105  --   CO2 24 23  --   GLUCOSE 107* 103*  --   BUN 14 12  --   CREATININE 0.69 0.66  --   CALCIUM 7.7* 7.5*  --   MG 1.7 2.0  --   AST 46*  --   --   ALT 52*  --   --   ALKPHOS 80  --   --   BILITOT 1.3*  --   --    Cardiac Enzymes No results for input(s): TROPONINI in the last 168 hours. RADIOLOGY:  No results found. ASSESSMENT AND PLAN:   85 year old male with history of hypertension who has had decreased appetite for several weeks who presents after multiple falls today and syncopal episode.  1. 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  They state that there  will be no one available here to do PEG tube until Wednesday.   Patient's INR is now elevated will have to give FFP's prior to any PEG tube insertion. In the meantime I will have to feed patient via Dobbhoff tube  2.  Acute kidney injury in the setting of dehydration:  -Hold nephrotoxic medications including blood pressure medications  - received IV fluids.  -Baseline creatinine 0.8 in October 2018 -Renal function normal -Foley in  place  3.  Urinary retention continue Foley  4.  Hypernatremia due to free water deficit continue D5W  5  Left Subdural Hematoma  S/p fall at home (delayed effects seen on MRI brain) with leftfrontal cortex CVA/infarct -hold ASA -seen by Dr Cari Caraway -Repeat CT head 09/20/18-- stable hematoma -MBS shows patient with severe dysphasia  6  Elevated liver function,LDH low platelet count and anemia - ultrasound abdomen-- no hepatosplenomegaly -seen by Dr Tasia Catchings -Positive EBV discussed with ID no further treatment needed   7.  Fever: etio unclear ?viral -BC 1/4 GPR --bacillus species -d/c vanc, cefepime Chest x-ray and UA are unremarkable. -UC neg -COVID-19 on admission neg -tmax 103--now afebrile -ID consult appreciated USG abdomen--no acute abnormality -cont doxy for possible tick bite illness stop date tomorrow  8.  Hypertension Continue  9 HYpokalemia: Replace  10. History of prostate cancer -pelvic x-rays  Shows diffuse osteopenia  11.Acute on Chronic anemia with melena and left subdural hematoma -GI saw pt--colonoscopy as out pt (pt preference) -EGD 11/2017--Barretts esophagus -Hemoglobin stable   CODE STATUS: full  DVT Prophylaxis: SCD  TOTAL TIME TAKING CARE OF THIS PATIENT: *30* minutes.  >50% time spent on counselling and coordination of care  POSSIBLE D/C IN 2-3 DAYS, DEPENDING ON CLINICAL CONDITION.  Note: This dictation was prepared with Dragon dictation along with smaller phrase technology. Any transcriptional errors that result from this process are unintentional.  Dustin Flock M.D on 10/01/2018 at 1:01 PM  Between 7am to 6pm - Pager - [phone]  After 6pm go to [email or web address] - password [email or web address] [facility]  Office  [phone]  CC: Primary care physician; [name], MDPatient ID: Mindi Junker, male   DOB: [birth year], 85 y.o.   MRN: [id number]

## 2018-10-01 NOTE — Progress Notes (Signed)
Bladder scan volume 430 ml's. Made sure foley bag unkinked, in proper position, and verified volume of sterile water in balloon 10 ml's. Will also report o day nurse.

## 2018-10-02 LAB — COMPREHENSIVE METABOLIC PANEL
ALT: 27 U/L (ref 0–44)
AST: 27 U/L (ref 15–41)
Albumin: 2.5 g/dL — ABNORMAL LOW (ref 3.5–5.0)
Alkaline Phosphatase: 78 U/L (ref 38–126)
Anion gap: 5 (ref 5–15)
BUN: 9 mg/dL (ref 8–23)
CO2: 25 mmol/L (ref 22–32)
Calcium: 7.9 mg/dL — ABNORMAL LOW (ref 8.9–10.3)
Chloride: 104 mmol/L (ref 98–111)
Creatinine, Ser: 0.56 mg/dL — ABNORMAL LOW (ref 0.61–1.24)
GFR calc Af Amer: >60 mL/min (ref >60)
GFR calc non Af Amer: >60 mL/min (ref >60)
Glucose, Bld: 104 mg/dL — ABNORMAL HIGH (ref 70–99)
Potassium: 3.3 mmol/L — ABNORMAL LOW (ref 3.5–5.1)
Sodium: 134 mmol/L — ABNORMAL LOW (ref 135–145)
Total Bilirubin: 1.4 mg/dL — ABNORMAL HIGH (ref 0.3–1.2)
Total Protein: 5.3 g/dL — ABNORMAL LOW (ref 6.5–8.1)

## 2018-10-02 LAB — GLUCOSE, CAPILLARY
Glucose-Capillary: 99 mg/dL (ref 70–99)
Glucose-Capillary: 87 mg/dL (ref 70–99)
Glucose-Capillary: 90 mg/dL (ref 70–99)
Glucose-Capillary: 98 mg/dL (ref 70–99)

## 2018-10-02 LAB — BPAM FFP
Blood Product Expiration Date: 202007092359
Blood Product Expiration Date: 202007092359
ISSUE DATE / TIME: 202007041736
ISSUE DATE / TIME: 202007042001
Unit Type and Rh: 6200
Unit Type and Rh: 6200

## 2018-10-02 LAB — PREPARE FRESH FROZEN PLASMA
Unit division: 0
Unit division: 0

## 2018-10-02 LAB — PROTIME-INR
INR: 1.5 — ABNORMAL HIGH (ref 0.8–1.2)
Prothrombin Time: 18.2 seconds — ABNORMAL HIGH (ref 11.4–15.2)

## 2018-10-02 LAB — APTT: aPTT: 46 seconds — ABNORMAL HIGH (ref 24–36)

## 2018-10-02 LAB — MAGNESIUM: Magnesium: 1.9 mg/dL (ref 1.7–2.4)

## 2018-10-02 MED ORDER — POTASSIUM CHLORIDE 10 MEQ/100ML IV SOLN
10.0000 meq | INTRAVENOUS | Status: AC
  Administered 2018-10-02 (×3): 10 meq via INTRAVENOUS
  Filled 2018-10-02 (×4): qty 100

## 2018-10-02 MED ORDER — SODIUM CHLORIDE 0.9% IV SOLUTION: Freq: Once | INTRAVENOUS | Status: DC

## 2018-10-02 MED ORDER — VITAMIN K1 10 MG/ML IJ SOLN
10.0000 mg | Freq: Once | INTRAMUSCULAR | Status: AC
  Administered 2018-10-02: 10 mg via SUBCUTANEOUS
  Filled 2018-10-02: qty 1

## 2018-10-02 MED ORDER — POTASSIUM CHLORIDE 10 MEQ/100ML IV SOLN
10.0000 meq | INTRAVENOUS | Status: AC
  Administered 2018-10-02 (×2): 10 meq via INTRAVENOUS
  Filled 2018-10-02 (×3): qty 100

## 2018-10-02 NOTE — Consult Note (Signed)
PHARMACY CONSULT NOTE - FOLLOW UP  Pharmacy Consult for Electrolyte Monitoring and Replacement   Recent Labs: Potassium (mmol/L)  Date Value  10/02/2018 3.3 (L)   Magnesium (mg/dL)  Date Value  10/02/2018 1.9   Calcium (mg/dL)  Date Value  10/02/2018 7.9 (L)   Albumin (g/dL)  Date Value  10/02/2018 2.5 (L)   Phosphorus (mg/dL)  Date Value  09/29/2018 2.5   Sodium (mmol/L)  Date Value  10/02/2018 134 (L)   Corrected Ca: 9.1 mg/dL  Assessment: Patient admitted with  Hypokalemia. Pharmacy consulted for electrolyte monitoring and replacement. Patient unable to take oral replacement at this time.  He received 10 mEq IV KCl yesterday with no improvement  Goal of Therapy:  Electrolytes WNL  Plan:   Patient receiving D5 at 75 mL/hr   Replace potassium with 30 mEq IV KCl    Will F/U with AM labs and continue to replace electrolytes as needed.   Dallie Piles, PharmD Clinical Pharmacist 10/02/2018 8:51 AM

## 2018-10-02 NOTE — Progress Notes (Signed)
The patient was given FFP with a decrease his INR from 2-1.5.  The patient will be given another unit of FFP today and he will be set up for a PEG tube placement for tomorrow.  I have discussed this with his hospitalist.  The patient has agreed to the procedure.

## 2018-10-02 NOTE — Progress Notes (Signed)
Daughter, [name], updated on pts condition and plan of care at 0900 and 1545 by this RN.

## 2018-10-02 NOTE — Progress Notes (Signed)
[facility] at [place] NAME: Salathiel Ferrara    MR#:  [id number]  DATE OF BIRTH:  [birth year]  SUBJECTIVE:  Patient's INR is improved to 1.5  REVIEW OF SYSTEMS:   Review of Systems  Constitutional: Negative for chills, fever and weight loss.  HENT: Negative for ear discharge, ear pain and nosebleeds.   Eyes: Negative for blurred vision, pain and discharge.  Respiratory: Negative for sputum production, shortness of breath, wheezing and stridor.   Cardiovascular: Negative for chest pain, palpitations, orthopnea and PND.  Gastrointestinal: Negative for abdominal pain, diarrhea, melena, nausea and vomiting.  Genitourinary: Negative for frequency and urgency.  Musculoskeletal: Negative for back pain, falls and joint pain.  Neurological: Positive for weakness. Negative for sensory change, speech change and focal weakness.  Psychiatric/Behavioral: Negative for depression and hallucinations. The patient is not nervous/anxious.      DRUG ALLERGIES:  No Known Allergies  VITALS:  Blood pressure 134/82, pulse 87, temperature 98.1 F (36.7 C), temperature source Oral, resp. rate 19, height 6' (1.829 m), weight 60.5 kg, SpO2 97 %.  PHYSICAL EXAMINATION:   Physical Exam  GENERAL:  85 y.o.-year-old patient lying in the bed with no acute distress.  EYES: Pupils equal, round, reactive to light and accommodation. No scleral icterus. Extraocular muscles intact.  HEENT: Head atraumatic, normocephalic. Oropharynx and nasopharynx clear.  Bruising on his forehead NECK:  Supple, no jugular venous distention. No thyroid enlargement, no tenderness.  LUNGS: Normal breath sounds bilaterally, no wheezing, rales, rhonchi. No use of accessory muscles of respiration.  CARDIOVASCULAR: S1, S2 normal. No murmurs, rubs, or gallops.  ABDOMEN: Soft, nontender, nondistended. Bowel sounds present. No organomegaly or mass.  EXTREMITIES: No cyanosis, clubbing or edema  b/l.    NEUROLOGIC: Cranial nerves II through XII are intact. No focal Motor or sensory deficits b/l.  Overall weak, mild dysarthria PSYCHIATRIC:  patient is alert and oriented x 3.  SKIN: No obvious rash, lesion, or ulcer.   LABORATORY PANEL:  CBC Recent Labs  Lab 09/29/18 0432  WBC 7.9  HGB 8.7*  HCT 27.0*  PLT 334    Chemistries  Recent Labs  Lab 10/02/18 0424  NA 134*  K 3.3*  CL 104  CO2 25  GLUCOSE 104*  BUN 9  CREATININE 0.56*  CALCIUM 7.9*  MG 1.9  AST 27  ALT 27  ALKPHOS 78  BILITOT 1.4*   Cardiac Enzymes No results for input(s): TROPONINI in the last 168 hours. RADIOLOGY:  No results found. ASSESSMENT AND PLAN:   85 year old male with history of hypertension who has had decreased appetite for several weeks who presents after multiple falls today and syncopal episode.  1. 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  They state that there will be no one available here to do PEG tube until Wednesday.   GI was planning to do PEG tube yesterday however INR was elevated.  They will reattempt tomorrow morning.  Due to INR still being 1.5 we will give 1 unit of packed RBCs.   2.  Acute kidney injury in the setting of dehydration:  -Hold nephrotoxic medications including blood pressure medications  - received IV fluids.  -Baseline creatinine 0.8  in October 2018 -Renal function normal -Foley in place  3.  Urinary retention continue Foley  4.  Hypernatremia due to free water deficit continue D5W  5  Left Subdural Hematoma  S/p fall at home (delayed effects seen on MRI brain) with leftfrontal cortex CVA/infarct -hold  ASA -seen by Dr Cari Caraway -Repeat CT head 09/20/18-- stable hematoma -MBS shows patient with severe dysphasia  6  Elevated liver function,LDH low platelet count and anemia - ultrasound abdomen-- no hepatosplenomegaly -seen by Dr Tasia Catchings -Positive EBV discussed with ID no further treatment needed -Patient has ericulosis   7.  Fever:  Due to ehrlichiosis  Sp  doxy for ehrlichiosis  8.  Hypertension Continue to monitor  9 HYpokalemia: Replace  10. History of prostate cancer -pelvic x-rays  Shows diffuse osteopenia  11.Acute on Chronic anemia with melena and left subdural hematoma -GI saw pt--colonoscopy as out pt (pt preference) -EGD 11/2017--Barretts esophagus -Hemoglobin stable   CODE STATUS: full  DVT Prophylaxis: SCD  TOTAL TIME TAKING CARE OF THIS PATIENT: *30* minutes.  >50% time spent on counselling and coordination of care  POSSIBLE D/C IN 2-3 DAYS, DEPENDING ON CLINICAL CONDITION.  Note: This dictation was prepared with Dragon dictation along with smaller phrase technology. Any transcriptional errors that result from this process are unintentional.  Dustin Flock M.D on 10/02/2018 at 12:27 PM  Between 7am to 6pm - Pager - [phone]  After 6pm go to [email or web address] - password [email or web address] [facility]  Office  [phone]  CC: Primary care physician; [name], MDPatient ID: [name], male   DOB: 10/07/1933, 85 y.o.   MRN: [id number]

## 2018-10-03 ENCOUNTER — Encounter: Payer: Self-pay | Admitting: Certified Registered Nurse Anesthetist

## 2018-10-03 ENCOUNTER — Encounter
Admission: EM | Disposition: A | Discharge status: Skilled Nursing Facility | Payer: Self-pay | Source: Home / Self Care | Attending: Internal Medicine

## 2018-10-03 ENCOUNTER — Inpatient Hospital Stay: Payer: Medicare Other | Admitting: Anesthesiology

## 2018-10-03 DIAGNOSIS — L8990 Pressure ulcer of unspecified site, unspecified stage: Secondary | ICD-10-CM | POA: Insufficient documentation

## 2018-10-03 HISTORY — PX: PEG PLACEMENT: SHX5437

## 2018-10-03 LAB — GLUCOSE, CAPILLARY
Glucose-Capillary: 100 mg/dL — ABNORMAL HIGH (ref 70–99)
Glucose-Capillary: 92 mg/dL (ref 70–99)
Glucose-Capillary: 85 mg/dL (ref 70–99)
Glucose-Capillary: 91 mg/dL (ref 70–99)
Glucose-Capillary: 81 mg/dL (ref 70–99)

## 2018-10-03 LAB — COMPREHENSIVE METABOLIC PANEL
ALT: 24 U/L (ref 0–44)
AST: 25 U/L (ref 15–41)
Albumin: 2.7 g/dL — ABNORMAL LOW (ref 3.5–5.0)
Alkaline Phosphatase: 77 U/L (ref 38–126)
Anion gap: 7 (ref 5–15)
BUN: 7 mg/dL — ABNORMAL LOW (ref 8–23)
CO2: 27 mmol/L (ref 22–32)
Calcium: 8.3 mg/dL — ABNORMAL LOW (ref 8.9–10.3)
Chloride: 101 mmol/L (ref 98–111)
Creatinine, Ser: 0.56 mg/dL — ABNORMAL LOW (ref 0.61–1.24)
GFR calc Af Amer: >60 mL/min (ref >60)
GFR calc non Af Amer: >60 mL/min (ref >60)
Glucose, Bld: 110 mg/dL — ABNORMAL HIGH (ref 70–99)
Potassium: 3.6 mmol/L (ref 3.5–5.1)
Sodium: 135 mmol/L (ref 135–145)
Total Bilirubin: 1.4 mg/dL — ABNORMAL HIGH (ref 0.3–1.2)
Total Protein: 5.6 g/dL — ABNORMAL LOW (ref 6.5–8.1)

## 2018-10-03 LAB — CBC
HCT: 25.9 % — ABNORMAL LOW (ref 39.0–52.0)
Hemoglobin: 8.3 g/dL — ABNORMAL LOW (ref 13.0–17.0)
MCH: 28.5 pg (ref 26.0–34.0)
MCHC: 32 g/dL (ref 30.0–36.0)
MCV: 89 fL (ref 80.0–100.0)
Platelets: 368 10*3/uL (ref 150–400)
RBC: 2.91 MIL/uL — ABNORMAL LOW (ref 4.22–5.81)
RDW: 13.5 % (ref 11.5–15.5)
WBC: 6.2 10*3/uL (ref 4.0–10.5)
nRBC: 0 % (ref 0.0–0.2)

## 2018-10-03 LAB — PROTIME-INR
INR: 1.2 (ref 0.8–1.2)
Prothrombin Time: 14.6 seconds (ref 11.4–15.2)

## 2018-10-03 SURGERY — INSERTION, PEG TUBE
Anesthesia: General

## 2018-10-03 MED ORDER — METOPROLOL TARTRATE 5 MG/5ML IV SOLN
INTRAVENOUS | Status: AC
  Administered 2018-10-03: 17:00:00
  Filled 2018-10-03: qty 5

## 2018-10-03 MED ORDER — PHENYLEPHRINE HCL (PRESSORS) 10 MG/ML IV SOLN
INTRAVENOUS | Status: DC | PRN
  Administered 2018-10-03: 100 ug via INTRAVENOUS

## 2018-10-03 MED ORDER — VITAMIN C 500 MG PO TABS
250.0000 mg | ORAL_TABLET | Freq: Two times a day (BID) | ORAL | Status: DC
  Administered 2018-10-04 – 2018-10-14 (×21): 250 mg
  Filled 2018-10-03 (×20): qty 1

## 2018-10-03 MED ORDER — MORPHINE SULFATE (PF) 2 MG/ML IV SOLN
2.0000 mg | Freq: Once | INTRAVENOUS | Status: AC
  Administered 2018-10-03: 2 mg via INTRAVENOUS

## 2018-10-03 MED ORDER — FREE WATER
75.0000 mL | Status: DC
  Administered 2018-10-03 – 2018-10-12 (×53): 75 mL

## 2018-10-03 MED ORDER — OSMOLITE 1.5 CAL PO LIQD
1000.0000 mL | ORAL | Status: DC
  Administered 2018-10-04 – 2018-10-11 (×8): 1000 mL

## 2018-10-03 MED ORDER — PROPOFOL 500 MG/50ML IV EMUL
INTRAVENOUS | Status: DC | PRN
  Administered 2018-10-03: 100 ug/kg/min via INTRAVENOUS

## 2018-10-03 MED ORDER — SODIUM CHLORIDE 0.9 % IV SOLN
INTRAVENOUS | Status: DC | PRN
  Administered 2018-10-03: 12:00:00 via INTRAVENOUS

## 2018-10-03 MED ORDER — METOPROLOL TARTRATE 5 MG/5ML IV SOLN
5.0000 mg | Freq: Once | INTRAVENOUS | Status: AC
  Administered 2018-10-03: 5 mg via INTRAVENOUS
  Filled 2018-10-03: qty 5

## 2018-10-03 MED ORDER — PROPOFOL 500 MG/50ML IV EMUL
INTRAVENOUS | Status: AC
  Filled 2018-10-03: qty 50

## 2018-10-03 MED ORDER — METOPROLOL TARTRATE 5 MG/5ML IV SOLN
5.0000 mg | Freq: Once | INTRAVENOUS | Status: AC
  Administered 2018-10-03: 5 mg via INTRAVENOUS

## 2018-10-03 MED ORDER — PROPOFOL 10 MG/ML IV BOLUS
INTRAVENOUS | Status: DC | PRN
  Administered 2018-10-03: 50 mg via INTRAVENOUS

## 2018-10-03 MED ORDER — DILTIAZEM HCL 25 MG/5ML IV SOLN
10.0000 mg | Freq: Once | INTRAVENOUS | Status: AC
  Administered 2018-10-04: 10 mg via INTRAVENOUS
  Filled 2018-10-03: qty 5

## 2018-10-03 NOTE — Progress Notes (Signed)
[facility] at [place] NAME: [name]    MR#:  [id number]  DATE OF BIRTH:  1933/10/09  SUBJECTIVE:  Patient is not having any concerns today.  He is agreeable for PEG placement today.  REVIEW OF SYSTEMS:   Review of Systems  Constitutional: Negative for chills, fever and weight loss.  HENT: Negative for ear discharge, ear pain and nosebleeds.   Eyes: Negative for blurred vision, pain and discharge.  Respiratory: Negative for sputum production, shortness of breath, wheezing and stridor.   Cardiovascular: Negative for chest pain, palpitations, orthopnea and PND.  Gastrointestinal: Negative for abdominal pain, diarrhea, melena, nausea and vomiting.  Genitourinary: Negative for frequency and urgency.  Musculoskeletal: Negative for back pain, falls and joint pain.  Neurological: Positive for weakness. Negative for sensory change, speech change and focal weakness.  Psychiatric/Behavioral: Negative for depression and hallucinations. The patient is not nervous/anxious.     DRUG ALLERGIES:  No Known Allergies  VITALS:  Blood pressure 110/80, pulse 89, temperature (!) 97.3 F (36.3 C), resp. rate 20, height 6' (1.829 m), weight 60.5 kg, SpO2 100 %.  PHYSICAL EXAMINATION:   Physical Exam  GENERAL:  85 y.o.-year-old patient lying in the bed with no acute distress.  EYES: Pupils equal, round, reactive to light and accommodation. No scleral icterus. Extraocular muscles intact.  HEENT: Head atraumatic, normocephalic. Oropharynx and nasopharynx clear.  + Ecchymoses on his forehead NECK:  Supple, no jugular venous distention. No thyroid enlargement, no tenderness.  LUNGS: Normal breath sounds bilaterally, no wheezing, rales, rhonchi. No use of accessory muscles of respiration.  CARDIOVASCULAR: RRR, S1, S2 normal. No murmurs, rubs, or gallops.  ABDOMEN: Soft, nontender, nondistended. Bowel sounds present. No organomegaly or mass.   EXTREMITIES: No cyanosis, clubbing or edema b/l.    NEUROLOGIC: Cranial nerves II through XII are intact. No focal Motor or sensory deficits b/l.  + Global weakness. PSYCHIATRIC:  patient is alert and oriented x 3.  SKIN: No obvious rash, lesion, or ulcer.   LABORATORY PANEL:  CBC Recent Labs  Lab 10/03/18 0439  WBC 6.2  HGB 8.3*  HCT 25.9*  PLT 368    Chemistries  Recent Labs  Lab 10/02/18 0424 10/03/18 0439  NA 134* 135  K 3.3* 3.6  CL 104 101  CO2 25 27  GLUCOSE 104* 110*  BUN 9 7*  CREATININE 0.56* 0.56*  CALCIUM 7.9* 8.3*  MG 1.9  --   AST 27 25  ALT 27 24  ALKPHOS 78 77  BILITOT 1.4* 1.4*   Cardiac Enzymes No results for input(s): TROPONINI in the last 168 hours. RADIOLOGY:  No results found. ASSESSMENT AND PLAN:   85 year old male with history of hypertension who has had decreased appetite for several weeks who presents after multiple falls today and syncopal episode.  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 -Seen by GI-patient preferred outpatient colonoscopy -Recent EGD 11/2017 with Barrett's esophagus  Urinary retention -Continue  Foley  Hypertension- BPs soft -Holding home lisinopril  History of prostate cancer -Monitor  CODE STATUS: full  DVT Prophylaxis: SCD  TOTAL TIME TAKING CARE OF THIS PATIENT: 35 minutes.  >50% time spent on counselling and  coordination of care  POSSIBLE D/C IN 1-2 DAYS, DEPENDING ON CLINICAL CONDITION.  Note: This dictation was prepared with Dragon dictation along with smaller phrase technology. Any transcriptional errors that result from this process are unintentional.  Evette Doffing M.D on 10/03/2018 at 1:30 PM  Between 7am to 6pm - Pager - [phone]  After 6pm go to [email or web address] - password [email or web address] [facility]  Office  [phone]  CC: Primary care physician; [name], MDPatient ID: [name], male   DOB: [birth year], 85 y.o.   MRN: [id number]

## 2018-10-03 NOTE — Anesthesia Post-op Follow-up Note (Signed)
Anesthesia QCDR form completed.        

## 2018-10-03 NOTE — Progress Notes (Signed)
Patient HR is sustaining in the 140's. Dr paged new orders given. Will continue to monitor.

## 2018-10-03 NOTE — Transfer of Care (Signed)
Immediate Anesthesia Transfer of Care Note  Patient: [name]  Procedure(s) Performed: PERCUTANEOUS ENDOSCOPIC GASTROSTOMY (PEG) PLACEMENT (N/A )  Patient Location: PACU  Anesthesia Type:General  Level of Consciousness: sedated  Airway & Oxygen Therapy: Patient Spontanous Breathing and Patient connected to nasal cannula oxygen  Post-op Assessment: Report given to RN and Post -op Vital signs reviewed and stable  Post vital signs: Reviewed and stable  Last Vitals:  Vitals Value Taken Time  BP 124/58 10/03/18 1256  Temp 36.3 C 10/03/18 1256  Pulse 96 10/03/18 1256  Resp 11 10/03/18 1256  SpO2 100 % 10/03/18 1256  Vitals shown include unvalidated device data.  Last Pain:  Vitals:   10/03/18 1256  TempSrc:   PainSc: 0-No pain         Complications: No apparent anesthesia complications

## 2018-10-03 NOTE — Progress Notes (Signed)
Report to RN

## 2018-10-03 NOTE — Progress Notes (Addendum)
Patients HR sustaining 140's-150's, MD notified, BP 188/115, orders given for 5mg  x1 IV lopressor and morphine 2mg . Will give and reassess.

## 2018-10-03 NOTE — Care Management Important Message (Signed)
Important Message  Patient Details  Name: JAISON PETRAGLIA MRN: [id number] Date of Birth: [birth year]   Medicare Important Message Given:  Yes     Juliann Pulse A Koden Hunzeker 10/03/2018, 11:26 AM

## 2018-10-03 NOTE — Anesthesia Preprocedure Evaluation (Signed)
Anesthesia Evaluation  Patient identified by MRN, date of birth, ID band Patient awake    Reviewed: Allergy & Precautions, NPO status , Patient's Chart, lab work & pertinent test results  History of Anesthesia Complications Negative for: history of anesthetic complications  Airway Mallampati: III       Dental  (+) Upper Dentures, Lower Dentures   Pulmonary neg sleep apnea, neg COPD, former smoker,           Cardiovascular hypertension, Pt. on medications (-) Past MI and (-) CHF (-) dysrhythmias (-) Valvular Problems/Murmurs     Neuro/Psych neg Seizures S/p fall with subdural hematoma, dysphagia    GI/Hepatic Neg liver ROS, neg GERD  ,  Endo/Other  neg diabetes  Renal/GU ARFRenal disease     Musculoskeletal   Abdominal   Peds  Hematology  (+) anemia ,   Anesthesia Other Findings   Reproductive/Obstetrics                             Anesthesia Physical Anesthesia Plan  ASA: III  Anesthesia Plan: General   Post-op Pain Management:    Induction: Intravenous  PONV Risk Score and Plan: 2 and Propofol infusion and TIVA  Airway Management Planned: Nasal Cannula  Additional Equipment:   Intra-op Plan:   Post-operative Plan:   Informed Consent: I have reviewed the patients History and Physical, chart, labs and discussed the procedure including the risks, benefits and alternatives for the proposed anesthesia with the patient or authorized representative who has indicated his/her understanding and acceptance.       Plan Discussed with:   Anesthesia Plan Comments:         Anesthesia Quick Evaluation

## 2018-10-03 NOTE — Plan of Care (Signed)
  Problem: Education: Goal: Knowledge of General Education information will improve Description: Including pain rating scale, medication(s)/side effects and non-pharmacologic comfort measures Outcome: Progressing   Problem: Nutrition: Goal: Adequate nutrition will be maintained Outcome: Not Progressing   Problem: Elimination: Goal: Will not experience complications related to urinary retention Outcome: Not Progressing

## 2018-10-03 NOTE — Op Note (Signed)
[facility] Patient Name: [name] Procedure Date: 10/03/2018 12:33 PM MRN: [id number] Account #: [id number] Date of Birth: 22-Oct-1933 Admit Type: Inpatient Age: 85 Room: [facility] ENDO ROOM 4 Gender: Male Note Status: Finalized Procedure:            Upper GI endoscopy Indications:          Dysphagia Providers:            Lucilla Lame MD, MD Referring MD:         No Local Md, MD (Referring MD) Medicines:            Propofol per Anesthesia Complications:        No immediate complications. Procedure:            Pre-Anesthesia Assessment:                       - Prior to the procedure, a History and Physical was                        performed, and patient medications and allergies were                        reviewed. The patient's tolerance of previous                        anesthesia was also reviewed. The risks and benefits of                        the procedure and the sedation options and risks were                        discussed with the patient. All questions were                        answered, and informed consent was obtained. Prior                        Anticoagulants: The patient has taken no previous                        anticoagulant or antiplatelet agents. ASA Grade                        Assessment: III - A patient with severe systemic                        disease. After reviewing the risks and benefits, the                        patient was deemed in satisfactory condition to undergo                        the procedure.                       After obtaining informed consent, the endoscope was                        passed under direct vision. Throughout the procedure,  the patient's blood pressure, pulse, and oxygen                        saturations were monitored continuously. The Endoscope                        was introduced through the mouth, and advanced to the                        second  part of duodenum. The upper GI endoscopy was                        accomplished without difficulty. The patient tolerated                        the procedure well. Findings:      The examined esophagus was normal.      The entire examined stomach was normal. The patient was placed in the       supine position for PEG placement. The stomach was insufflated to appose       gastric and abdominal walls. A site was located in the body of the       stomach with excellent transillumination for placement. The abdominal       wall was marked and prepped in a sterile manner. The area was       anesthetized with 2 mL of 0.5% lidocaine. The trocar needle was       introduced through the abdominal wall and into the stomach under direct       endoscopic view. A snare was introduced through the endoscope and opened       in the gastric lumen. The guide wire was passed through the trocar and       into the open snare. The snare was closed around the guide wire. The       endoscope and snare were removed, pulling the wire out through the       mouth. A skin incision was made at the site of needle insertion. The       externally removable gastrostomy tube was lubricated. The G-tube was       tied to the guide wire and pulled through the mouth and into the       stomach. The trocar needle was removed, and the gastrostomy tube was       pulled out from the stomach through the skin. The external bumper was       attached to the gastrostomy tube, and the tube was cut to remove the       guide wire. The final position of the gastrostomy tube was confirmed by       relook endoscopy, and skin marking noted to be 3 cm at the external       bumper. The final tension and compression of the abdominal wall by the       PEG tube and external bumper were checked and revealed that the bumper       was loose and lightly touching the skin. The feeding tube was capped,       and the tube site cleaned and dressed.      One  non-bleeding superficial duodenal ulcer with no stigmata of bleeding       was found in the duodenal bulb. Impression:           -  Normal esophagus.                       - Normal stomach.                       - Non-bleeding duodenal ulcer with no stigmata of                        bleeding.                       - An externally removable PEG placement was                        successfully completed.                       - No specimens collected. Recommendation:       - Return patient to hospital ward for ongoing care.                       - Please follow the post-PEG recommendations including:                        Nutrition consult for formula and volume, change                        dressing once per day and NPO x4 hrs then water today. Procedure Code(s):    --- Professional ---                       [phone], Esophagogastroduodenoscopy, flexible, transoral;                        with directed placement of percutaneous gastrostomy tube Diagnosis Code(s):    --- Professional ---                       R13.10, Dysphagia, unspecified                       K26.9, Duodenal ulcer, unspecified as acute or chronic,                        without hemorrhage or perforation CPT copyright 2019 American Medical Association. All rights reserved. The codes documented in this report are preliminary and upon coder review may  be revised to meet current compliance requirements. Lucilla Lame MD, MD 10/03/2018 12:51:47 PM This report has been signed electronically. Number of Addenda: 0 Note Initiated On: 10/03/2018 12:33 PM Estimated Blood Loss: Estimated blood loss: none.      [facility]

## 2018-10-03 NOTE — Progress Notes (Signed)
PT Cancellation Note  Patient Details Name: ESSA MALACHI MRN: [id number] DOB: 1933/09/07   Cancelled Treatment:    Reason Eval/Treat Not Completed: Patient at procedure or test/unavailable. Will see when able.   Dilcia Rybarczyk 10/03/2018, 12:27 PM

## 2018-10-03 NOTE — Consult Note (Signed)
PHARMACY CONSULT NOTE - FOLLOW UP  Pharmacy Consult for Electrolyte Monitoring and Replacement   Recent Labs: Potassium (mmol/L)  Date Value  10/03/2018 3.6   Magnesium (mg/dL)  Date Value  10/02/2018 1.9   Calcium (mg/dL)  Date Value  10/03/2018 8.3 (L)   Albumin (g/dL)  Date Value  10/03/2018 2.7 (L)   Phosphorus (mg/dL)  Date Value  09/29/2018 2.5   Sodium (mmol/L)  Date Value  10/03/2018 135   Corrected Ca: 9.34 mg/dL  Assessment: Patient admitted with  Hypokalemia. Pharmacy consulted for electrolyte monitoring and replacement. Patient unable to take oral replacement at this time.  He received 10 mEq IV KCl yesterday with no improvement  Goal of Therapy:  Electrolytes WNL  Plan:   No replacement needed for today.   Will F/U with AM labs and continue to replace electrolytes as needed.   Rowland Lathe, PharmD Clinical Pharmacist 10/03/2018 7:38 AM

## 2018-10-04 ENCOUNTER — Encounter: Payer: Self-pay | Admitting: Gastroenterology

## 2018-10-04 ENCOUNTER — Inpatient Hospital Stay: Payer: Medicare Other

## 2018-10-04 DIAGNOSIS — R000 Tachycardia, unspecified: Secondary | ICD-10-CM

## 2018-10-04 DIAGNOSIS — D689 Coagulation defect, unspecified: Secondary | ICD-10-CM

## 2018-10-04 LAB — PREPARE FRESH FROZEN PLASMA: Unit division: 0

## 2018-10-04 LAB — BPAM FFP
Blood Product Expiration Date: 202007102359
ISSUE DATE / TIME: 202007060006
Unit Type and Rh: 600

## 2018-10-04 LAB — BASIC METABOLIC PANEL
Anion gap: 11 (ref 5–15)
BUN: 11 mg/dL (ref 8–23)
CO2: 23 mmol/L (ref 22–32)
Calcium: 8.4 mg/dL — ABNORMAL LOW (ref 8.9–10.3)
Chloride: 99 mmol/L (ref 98–111)
Creatinine, Ser: 0.69 mg/dL (ref 0.61–1.24)
GFR calc Af Amer: >60 mL/min (ref >60)
GFR calc non Af Amer: >60 mL/min (ref >60)
Glucose, Bld: 89 mg/dL (ref 70–99)
Potassium: 4.3 mmol/L (ref 3.5–5.1)
Sodium: 133 mmol/L — ABNORMAL LOW (ref 135–145)

## 2018-10-04 LAB — MAGNESIUM: Magnesium: 1.9 mg/dL (ref 1.7–2.4)

## 2018-10-04 LAB — CBC
HCT: 31.1 % — ABNORMAL LOW (ref 39.0–52.0)
Hemoglobin: 9.8 g/dL — ABNORMAL LOW (ref 13.0–17.0)
MCH: 28.3 pg (ref 26.0–34.0)
MCHC: 31.5 g/dL (ref 30.0–36.0)
MCV: 89.9 fL (ref 80.0–100.0)
Platelets: 408 10*3/uL — ABNORMAL HIGH (ref 150–400)
RBC: 3.46 MIL/uL — ABNORMAL LOW (ref 4.22–5.81)
RDW: 13.4 % (ref 11.5–15.5)
WBC: 13.2 10*3/uL — ABNORMAL HIGH (ref 4.0–10.5)
nRBC: 0 % (ref 0.0–0.2)

## 2018-10-04 LAB — GLUCOSE, CAPILLARY
Glucose-Capillary: 98 mg/dL (ref 70–99)
Glucose-Capillary: 119 mg/dL — ABNORMAL HIGH (ref 70–99)

## 2018-10-04 MED ORDER — AMIODARONE HCL IN DEXTROSE 360-4.14 MG/200ML-% IV SOLN
30.0000 mg/h | INTRAVENOUS | Status: DC
  Administered 2018-10-04: 30 mg/h via INTRAVENOUS

## 2018-10-04 MED ORDER — AMIODARONE HCL IN DEXTROSE 360-4.14 MG/200ML-% IV SOLN
60.0000 mg/h | INTRAVENOUS | Status: DC
  Administered 2018-10-04 (×2): 60 mg/h via INTRAVENOUS
  Filled 2018-10-04 (×2): qty 200

## 2018-10-04 MED ORDER — SODIUM CHLORIDE 0.9 % IV BOLUS
500.0000 mL | Freq: Once | INTRAVENOUS | Status: AC
  Administered 2018-10-04: 500 mL via INTRAVENOUS

## 2018-10-04 MED ORDER — ACETAMINOPHEN 325 MG PO TABS
650.0000 mg | ORAL_TABLET | Freq: Four times a day (QID) | ORAL | Status: DC | PRN
  Administered 2018-10-04 – 2018-10-13 (×6): 650 mg via ORAL
  Filled 2018-10-04 (×6): qty 2

## 2018-10-04 MED ORDER — METOPROLOL TARTRATE 25 MG PO TABS
25.0000 mg | ORAL_TABLET | Freq: Two times a day (BID) | ORAL | Status: DC
  Administered 2018-10-04 – 2018-10-14 (×20): 25 mg
  Filled 2018-10-04 (×20): qty 1

## 2018-10-04 MED ORDER — AMIODARONE LOAD VIA INFUSION
150.0000 mg | Freq: Once | INTRAVENOUS | Status: AC
  Administered 2018-10-04: 150 mg via INTRAVENOUS
  Filled 2018-10-04: qty 83.34

## 2018-10-04 NOTE — Progress Notes (Addendum)
Patient was given Cardizem 10 mg IV for HR sustaining in the 130's. Patient HR is now 140's. Dr paged, order for drip received. Will continue to monitor.

## 2018-10-04 NOTE — Consult Note (Signed)
PHARMACY CONSULT NOTE - FOLLOW UP  Pharmacy Consult for Electrolyte Monitoring and Replacement   Recent Labs: Potassium (mmol/L)  Date Value  10/03/2018 4.3   Magnesium (mg/dL)  Date Value  10/03/2018 1.9   Calcium (mg/dL)  Date Value  10/03/2018 8.4 (L)   Albumin (g/dL)  Date Value  10/03/2018 2.7 (L)   Phosphorus (mg/dL)  Date Value  09/29/2018 2.5   Sodium (mmol/L)  Date Value  10/03/2018 133 (L)   Corrected Ca: 9.44 mg/dL  Assessment: Patient admitted with  Hypokalemia. Pharmacy consulted for electrolyte monitoring and replacement. Patient unable to take oral replacement at this time.  He received 10 mEq IV KCl yesterday with no improvement  Goal of Therapy:  Electrolytes WNL  Plan:   No replacement needed for today.   Will F/U with AM labs and continue to replace electrolytes as needed.   Rowland Lathe, PharmD Clinical Pharmacist 10/04/2018 7:16 AM

## 2018-10-04 NOTE — Progress Notes (Signed)
[facility] [place] NAME: Corday Wyka    MR#:  [id number]  DATE OF BIRTH:  06-Jun-1933  SUBJECTIVE:  Having some soreness around his PEG site.  When working with physical therapy this morning, he became very lightheaded and had to lay back down.  He denies any chest pain, palpitations, shortness of breath.  REVIEW OF SYSTEMS:   Review of Systems  Constitutional: Negative for chills, fever and weight loss.  HENT: Negative for ear discharge, ear pain and nosebleeds.   Eyes: Negative for blurred vision, pain and discharge.  Respiratory: Negative for sputum production, shortness of breath, wheezing and stridor.   Cardiovascular: Negative for chest pain, palpitations, orthopnea and PND.  Gastrointestinal: Negative for abdominal pain, diarrhea, melena, nausea and vomiting.  Genitourinary: Negative for frequency and urgency.  Musculoskeletal: Negative for back pain, falls and joint pain.  Neurological: Positive for weakness. Negative for sensory change, speech change and focal weakness.  Psychiatric/Behavioral: Negative for depression and hallucinations. The patient is not nervous/anxious.     DRUG ALLERGIES:  No Known Allergies  VITALS:  Blood pressure 128/83, pulse 87, temperature 98.4 F (36.9 C), temperature source Oral, resp. rate 18, height 6' (1.829 m), weight 60.5 kg, SpO2 97 %.  PHYSICAL EXAMINATION:   Physical Exam  GENERAL:  85 y.o.-year-old patient lying in the bed with no acute distress.  EYES: Pupils equal, round, reactive to light and accommodation. No scleral icterus. Extraocular muscles intact.  HEENT: Head atraumatic, normocephalic. Oropharynx and nasopharynx clear.  + Ecchymoses on his forehead NECK:  Supple, no jugular venous distention. No thyroid enlargement, no tenderness.  LUNGS: Normal breath sounds bilaterally, no wheezing, rales, rhonchi. No use of accessory muscles of respiration.  CARDIOVASCULAR: RRR,  S1, S2 normal. No murmurs, rubs, or gallops.  ABDOMEN: Soft, nontender, nondistended. Bowel sounds present. No organomegaly or mass.  EXTREMITIES: No cyanosis, clubbing or edema b/l.    NEUROLOGIC: Cranial nerves II through XII are intact. No focal Motor or sensory deficits b/l.  + Global weakness. PSYCHIATRIC:  patient is alert and oriented x 3.  SKIN: No obvious rash, lesion, or ulcer.   LABORATORY PANEL:  CBC Recent Labs  Lab 10/03/18 2358  WBC 13.2*  HGB 9.8*  HCT 31.1*  PLT 408*    Chemistries  Recent Labs  Lab 10/03/18 0439 10/03/18 2358  NA 135 133*  K 3.6 4.3  CL 101 99  CO2 27 23  GLUCOSE 110* 89  BUN 7* 11  CREATININE 0.56* 0.69  CALCIUM 8.3* 8.4*  MG  --  1.9  AST 25  --   ALT 24  --   ALKPHOS 77  --   BILITOT 1.4*  --    Cardiac Enzymes No results for input(s): TROPONINI in the last 168 hours. RADIOLOGY:  No results found. ASSESSMENT AND PLAN:   85 year old male with history of hypertension who has had decreased appetite for several weeks who presents after multiple falls today and syncopal episode.  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 -Will give a 500 cc bolus -Will check orthostatic vitals in  the morning  Tachycardia- patient had heart rates up to the 150s overnight.  Unsure if this was sinus tachycardia versus A. fib with RVR. -Recent TSH normal -Started on amiodarone drip overnight, will stop the amiodarone and transition to metoprolol 25 mg twice daily -Continue cardiac monitoring  Leukocytosis- likely a  stress response after PEG placement.  May also be due to some hemoconcentration.  No infectious symptoms. 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 -Seen by GI-patient preferred outpatient colonoscopy -EGD 7/6 with a nonbleeding duodenal ulcer  Urinary retention -Continue Foley  Hypertension- BPs soft -Holding home lisinopril  History of prostate cancer -Monitor  CODE STATUS: full  DVT Prophylaxis: SCD  Possible discharge to SNF tomorrow if medically stable.  TOTAL TIME TAKING CARE OF THIS PATIENT: 45 minutes.  >50% time spent on counselling and coordination of care  POSSIBLE D/C IN 1-2 DAYS, DEPENDING ON CLINICAL CONDITION.  Note: This dictation was prepared with Dragon dictation along with smaller phrase technology. Any transcriptional errors that result from this process are unintentional.  Evette Doffing M.D on 10/04/2018 at 1:55 PM  Between 7am to 6pm - Pager - [phone]  After 6pm go to [email or web address] - password [email or web address] [facility]  Office  [phone]  CC: Primary care physician; [name], MDPatient ID: Mindi Junker, male   DOB: 1933-09-07, 85 y.o.   MRN: [id number]

## 2018-10-04 NOTE — Progress Notes (Signed)
The patient is status post PEG tube placement yesterday.  The patient has started tube feedings.  The patient PEG site appears to be slightly tender without any erythema or complications at this time.  The patient is feeling well today.  I will sign off.  Please call if any further GI concerns or questions.  We would like to thank you for the opportunity to participate in the care of [name].

## 2018-10-04 NOTE — Progress Notes (Signed)
Physical Therapy Treatment Patient Details Name: [name] MRN: [id number] DOB: 13-Aug-1933 Today's Date: 10/04/2018    History of Present Illness "[name]" [name] is an 84yoM admitted for syncope with multiple falls at home. Complains of hip pain upon arrival to hospital. History includes HTN and prostate cancer.  Noted multiple abrasions to body. Underwent PEG placement on 7/6.    PT Comments    Author in bed upon arrival, RN comes to clamp PEG feeding source. Pt agreeable to PT session. Educated on log roll technique as a means of pain control with bed mobility. Pt follows multimodal cues well when words are spoken loudly. ModA from sidelying to sitting EOB. Pt able to pull self to EOB to allow feet to rest on floor. Upon sitting, pt reports dizziness. 30 seconds later, pt reports dizziness as worse, although he reports he feels able to attempt assisted SPT to chair. Pt given verbal cues for arm/hand placement for transfer, but pt maintaining Left rigid grip on bed rail as Chief Strategy Officer notices beginnings of a Left lateral and slight posterior lean. Pt given additional cues again, but pt noted to be staring off into space forward, starting to shake in LUE, now no longer responding to author. Pt's left hand is pried from bed rail, and is totalA transferred into hooklying c head supported, then author pushes red 'staff-assist'. Pt continues to be nonresponsive without LOC for an additional 30 seconds with intermittent tremor of head/BUE, then gradually becomes more interactive with grimacing, slowed response, and more slurred speech. At this point MD and RN are in room at bedside. BP established as normostatic SBP in 120s similar to values from earlier in day. Pt repositioned in bed to sitting tall, BP again checked and without significant change, pt without any change in presentation. Discussed with RN/MD. PT to resume services tomorrow with orthostatic BP assessed at beginning of session.       Follow  Up Recommendations  SNF     Equipment Recommendations  Other (comment)    Recommendations for Other Services       Precautions / Restrictions Precautions Precautions: Fall Precaution Comments: High Fall Risk, multiple fall history Restrictions Weight Bearing Restrictions: No    Mobility  Bed Mobility Overal bed mobility: Needs Assistance Bed Mobility: Rolling;Sidelying to Sit Rolling: Min guard(education provided on positioning and log-roll technique.) Sidelying to sit: Mod assist(Pt weak, but holds onto PTs arms to pull self to sitting)       General bed mobility comments: Able to sccot self to EOB when cued, to rest feet on floor.(dizzy upon sitting which worsens progressively, then pt becomes weaker, eventually nonresponsive and begins to shake)  Transfers                    Ambulation/Gait                 Stairs             Wheelchair Mobility    Modified Rankin (Stroke Patients Only)       Balance                                            Cognition Arousal/Alertness: (inititally alert, then later drowsy after near-syncope episode) Behavior During Therapy: WFL for tasks assessed/performed Overall Cognitive Status: Within Functional Limits for tasks assessed  Exercises      General Comments        Pertinent Vitals/Pain Pain Assessment: 0-10 Pain Score: 5  Pain Location: Near acute PEG site. Pain Descriptors / Indicators: Aching Pain Intervention(s): Limited activity within patient's tolerance;Monitored during session;Premedicated before session    Home Living                      Prior Function            PT Goals (current goals can now be found in the care plan section) Acute Rehab PT Goals Patient Stated Goal: to go home PT Goal Formulation: With patient Time For Goal Achievement: 10/18/18 Potential to Achieve Goals: Fair Progress  towards PT goals: Not progressing toward goals - comment    Frequency    Min 2X/week      PT Plan Current plan remains appropriate    Co-evaluation              AM-PAC PT "6 Clicks" Mobility   Outcome Measure  Help needed turning from your back to your side while in a flat bed without using bedrails?: A Little Help needed moving from lying on your back to sitting on the side of a flat bed without using bedrails?: A Little Help needed moving to and from a bed to a chair (including a wheelchair)?: A Little Help needed standing up from a chair using your arms (e.g., wheelchair or bedside chair)?: A Lot Help needed to walk in hospital room?: Total Help needed climbing 3-5 steps with a railing? : Total 6 Click Score: 13    End of Session   Activity Tolerance: Patient limited by fatigue;Treatment limited secondary to medical complications (Comment);Other (comment)(presyncope) Patient left: with nursing/sitter in room;in bed Nurse Communication: Mobility status PT Visit Diagnosis: Unsteadiness on feet (R26.81);Muscle weakness (generalized) (M62.81);History of falling (Z91.81);Difficulty in walking, not elsewhere classified (R26.2);Dizziness and giddiness (R42)     Time: 0175-1025 PT Time Calculation (min) (ACUTE ONLY): 26 min  Charges:  $Therapeutic Activity: 23-37 mins                     1:55 PM, 10/04/18 Etta Grandchild, PT, DPT Physical Therapist - [facility]  [phone] ([place])    [facility] 10/04/2018, 1:44 PM

## 2018-10-04 NOTE — TOC Progression Note (Signed)
Transition of Care [facility]) - Progression Note    Patient Details  Name: [name] MRN: [id number] Date of Birth: 1934-03-29  Transition of [facility]) CM/SW Contact  [name], LCSW Phone Number: 10/04/2018, 11:53 AM  Clinical Narrative:     TOC following. Plan remains for dc to [facility] when stable. MD anticipating he will possibly be ready for dc tomorrow. Updated [name] at [facility]. Per [name], she called [name] and is awaiting return call to see if she needs to do new authorization or if they will provide extension as pt's previous auth expired yesterday.   Contacted PT, [name], to request PT session and updated notes to assist with new authorization.   Will follow and continue to assist with pt's TOC needs.  Expected Discharge Plan: Skilled Nursing Facility Barriers to Discharge: Continued Medical Work up  Expected Discharge Plan and Services Expected Discharge Plan: [place]   Discharge Planning Services: CM Consult                                           Social Determinants of Health (SDOH) Interventions    Readmission Risk Interventions Readmission Risk Prevention Plan 09/21/2018  Post Dischage Appt Complete  Medication Screening Complete  Transportation Screening Complete  Some recent data might be hidden

## 2018-10-04 NOTE — Plan of Care (Signed)
Tube feeds started today at rate of 25/hr then increased to 35hr per order as patient tolerating, goal rate is 55.  C/o 3/10 pain in abdomen, PRN tylenol given. Patient developed congested cough through shift, MD notified, order placed for CXR, oral care given.   Amiodarone gtt stopped. Patient started on metoprolol, currently in sinus rhythm tolerating well.   Problem: Education: Goal: Knowledge of General Education information will improve Description: Including pain rating scale, medication(s)/side effects and non-pharmacologic comfort measures Outcome: Progressing   Problem: Activity: Goal: Risk for activity intolerance will decrease Outcome: Not Progressing   Problem: Nutrition: Goal: Adequate nutrition will be maintained Outcome: Progressing

## 2018-10-04 NOTE — Progress Notes (Signed)
Nutrition Follow Up Note   DOCUMENTATION CODES:   Underweight  INTERVENTION:   Osmolite 1.5 @ goal rate of 34ml/hr  Recommend free water flushes 35ml q 4 hours.  Regimen provides 1980kcal/day, 83g/day protein, 1424ml/day free water   Vitamin C 250mg  BID via tube   Pt at high refeed risk; recommend monitor K, Mg and P labs daily  NUTRITION DIAGNOSIS:   Inadequate oral intake related to acute illness as evidenced by meal completion < 50%. -progressing with tube feeds  GOAL:   Patient will meet greater than or equal to 90% of their needs  -progressing with tube feeds  MONITOR:   Labs, Weight trends, Skin, I & O's, Diet advancement, tube feeds  ASSESSMENT:   85 year old male with history of hypertension who has had decreased appetite for several weeks who presents after multiple falls today and syncopal episode.  RD working remotely.  Pt s/p PEG placement 7/6. Tube feeds started today at goal rate; pt tolerating well thus far so will leave at goal. Pt is at high refeed risk; recommend monitor K, Mg and P labs daily. No new weight since 6/25; will request daily weights.   Medications reviewed and include: MVI, protonix, vitamin C  Labs reviewed: Na 133(L), K 4.3 wnl, Mg 1.9 wnl- 7/6 Wbc- 13.2(H), Hgb 9.8(L), Hct 31.1(L)-7/6  Diet Order:   Diet Order            Diet NPO time specified  Diet effective now             EDUCATION NEEDS:   No education needs have been identified at this time  Skin:  Skin Assessment: Reviewed RN Assessment  Last BM:  7/4  Height:   Ht Readings from Last 1 Encounters:  09/19/18 6' (1.829 m)    Weight:   Wt Readings from Last 1 Encounters:  09/22/18 60.5 kg    Ideal Body Weight:  80.9 kg  BMI:  Body mass index is 18.09 kg/m.  Estimated Nutritional Needs:   Kcal:  1700-2000kcal/day  Protein:  85-100g/day  Fluid:  >1.5L/day  Koleen Distance MS, RD, LDN Pager #- [phone] Office#- [phone] After Hours  Pager: [phone]

## 2018-10-04 NOTE — Anesthesia Postprocedure Evaluation (Signed)
Anesthesia Post Note  Patient: [name]  Procedure(s) Performed: PERCUTANEOUS ENDOSCOPIC GASTROSTOMY (PEG) PLACEMENT (N/A )  Patient location during evaluation: Endoscopy Anesthesia Type: General Level of consciousness: awake and alert and oriented Pain management: pain level controlled Vital Signs Assessment: post-procedure vital signs reviewed and stable Respiratory status: spontaneous breathing Cardiovascular status: blood pressure returned to baseline Anesthetic complications: no     Last Vitals:  Vitals:   10/04/18 0737 10/04/18 1253  BP: 123/87 128/83  Pulse: 91 87  Resp: 18   Temp: 36.9 C   SpO2: 99% 97%    Last Pain:  Vitals:   10/04/18 1047  TempSrc:   PainSc: Asleep                 Retha Bither

## 2018-10-04 NOTE — Progress Notes (Signed)
Hematology/Oncology Progress Note [facility] Telephone:([phone] [phone]  Patient Care Team: [name], MD as PCP - General (Family Medicine)   Name of the patient: [name]  [id number]  Feb 14, 1934  Date of visit: 10/04/18   INTERVAL HISTORY-  Patient is lying in the bed, no acute distress.  Tube feeding has been started.  Status post PEG tube placement yesterday. He denies any abdominal pain today Speech has improved.      Review of systems- Review of Systems  Constitutional: Positive for fatigue. Negative for chills and fever.  Respiratory: Negative for chest tightness and cough.   Cardiovascular: Negative for chest pain.  Gastrointestinal: Negative for abdominal pain and nausea.  Genitourinary: Negative for difficulty urinating.   Skin: Negative for rash.  Neurological: Negative for headaches.  Psychiatric/Behavioral: Negative for confusion.    No Known Allergies  Patient Active Problem List   Diagnosis Date Noted   Pressure injury of skin 10/03/2018   Dysphagia    Anemia    Thrombocytopenia (HCC)    Blood in stool    Elevated transaminase level    Syncope 09/19/2018     Past Medical History:  Diagnosis Date   Arthritis    Cancer [facility])    Prostate Cancer   Hypertension      Past Surgical History:  Procedure Laterality Date   ESOPHAGOGASTRODUODENOSCOPY (EGD) WITH PROPOFOL N/A 12/06/2017   Procedure: ESOPHAGOGASTRODUODENOSCOPY (EGD) WITH PROPOFOL;  Surgeon: Jonathon Bellows, MD;  Location: [facility] ENDOSCOPY;  Service: Gastroenterology;  Laterality: N/A;   PEG PLACEMENT N/A 10/03/2018   Procedure: PERCUTANEOUS ENDOSCOPIC GASTROSTOMY (PEG) PLACEMENT;  Surgeon: Lucilla Lame, MD;  Location: [facility] ENDOSCOPY;  Service: Endoscopy;  Laterality: N/A;   PROSTATE CRYOABLATION      Social History   Socioeconomic History   Marital status: Widowed    Spouse name: Not on file   Number of children: Not on file    Years of education: Not on file   Highest education level: Not on file  Occupational History   Not on file  Social Needs   Financial resource strain: Not on file   Food insecurity    Worry: Not on file    Inability: Not on file   Transportation needs    Medical: Not on file    Non-medical: Not on file  Tobacco Use   Smoking status: Former Smoker    Types: Pipe    Quit date: 03/30/2002    Years since quitting: 16.5   Smokeless tobacco: Never Used  Substance and Sexual Activity   Alcohol use: Yes   Drug use: Never   Sexual activity: Not on file  Lifestyle   Physical activity    Days per week: Not on file    Minutes per session: Not on file   Stress: Not on file  Relationships   Social connections    Talks on phone: Not on file    Gets together: Not on file    Attends religious service: Not on file    Active member of club or organization: Not on file    Attends meetings of clubs or organizations: Not on file    Relationship status: Not on file   Intimate partner violence    Fear of current or ex partner: Not on file    Emotionally abused: Not on file    Physically abused: Not on file    Forced sexual activity: Not on file  Other Topics Concern   Not  on file  Social History Narrative   Not on file     History reviewed. No pertinent family history.   Current Facility-Administered Medications:    0.9 %  sodium chloride infusion (Manually program via Guardrails IV Fluids), , Intravenous, Once, Lucilla Lame, MD   acetaminophen (TYLENOL) tablet 650 mg, 650 mg, Oral, Q6H PRN, Mayo, Pete Pelt, MD, 650 mg at 10/04/18 1814   feeding supplement (OSMOLITE 1.5 CAL) liquid 1,000 mL, 1,000 mL, Per Tube, Continuous, Mayo, Pete Pelt, MD, Last Rate: 35 mL/hr at 10/04/18 1827   free water 75 mL, 75 mL, Per Tube, Q4H, Mayo, Pete Pelt, MD, 75 mL at 10/04/18 1750   HYDROcodone-acetaminophen (NORCO/VICODIN) 5-325 MG per tablet 1 tablet, 1 tablet, Oral, Q6H PRN,  Lucilla Lame, MD   metoprolol tartrate (LOPRESSOR) tablet 25 mg, 25 mg, Per Tube, BID, Mayo, Pete Pelt, MD, 25 mg at 10/04/18 1017   morphine 2 MG/ML injection 1 mg, 1 mg, Intravenous, Q4H PRN, Lucilla Lame, MD, 1 mg at 10/04/18 1017   multivitamin with minerals tablet 1 tablet, 1 tablet, Oral, Daily, Lucilla Lame, MD, 1 tablet at 10/04/18 1017   neomycin-bacitracin-polymyxin (NEOSPORIN) ointment packet, , Topical, Daily, Lucilla Lame, MD, 1 application at 33/43/56 1017   ondansetron (ZOFRAN) tablet 4 mg, 4 mg, Oral, Q6H PRN **OR** ondansetron (ZOFRAN) injection 4 mg, 4 mg, Intravenous, Q6H PRN, Allen Norris, Darren, MD   pantoprazole (PROTONIX) injection 40 mg, 40 mg, Intravenous, Q12H, Wohl, Darren, MD, 40 mg at 10/04/18 1017   polyethylene glycol (MIRALAX / GLYCOLAX) packet 17 g, 17 g, Oral, Daily PRN, Allen Norris, Darren, MD   vitamin C (ASCORBIC ACID) tablet 250 mg, 250 mg, Per Tube, BID, Mayo, Pete Pelt, MD, 250 mg at 10/04/18 1016   Physical exam:  Vitals:   10/04/18 0307 10/04/18 0737 10/04/18 1253 10/04/18 1643  BP: 125/79 123/87 128/83 112/79  Pulse: 93 91 87 98  Resp: 16 18  16   Temp:  98.4 F (36.9 C)    TempSrc:  Oral    SpO2: 97% 99% 97% 99%  Weight:      Height:       Physical Exam  Constitutional: He is oriented to person, place, and time. No distress.  Frail appearance, lying the bed  HENT:  Mouth/Throat: No oropharyngeal exudate.  Left forehead bruising Oral mucosa dry  Eyes: Pupils are equal, round, and reactive to light. EOM are normal. No scleral icterus.  Neck: Normal range of motion. Neck supple.  Cardiovascular: Normal rate and regular rhythm.  No murmur heard. Pulmonary/Chest: Effort normal. No respiratory distress. He has no rales. He exhibits no tenderness.  Abdominal: Soft. He exhibits no distension. There is no abdominal tenderness.  PEG tube in place.    Musculoskeletal: Normal range of motion.        General: No edema.     Comments: Slurred speech.     Neurological: He is alert and oriented to person, place, and time.  Skin: Skin is warm and dry. He is not diaphoretic.  Bruises  Psychiatric: Affect normal.       CMP Latest Ref Rng & Units 10/03/2018  Glucose 70 - 99 mg/dL 89  BUN 8 - 23 mg/dL 11  Creatinine 0.61 - 1.24 mg/dL 0.69  Sodium 135 - 145 mmol/L 133(L)  Potassium 3.5 - 5.1 mmol/L 4.3  Chloride 98 - 111 mmol/L 99  CO2 22 - 32 mmol/L 23  Calcium 8.9 - 10.3 mg/dL 8.4(L)  Total Protein 6.5 - 8.1  g/dL -  Total Bilirubin 0.3 - 1.2 mg/dL -  Alkaline Phos 38 - 126 U/L -  AST 15 - 41 U/L -  ALT 0 - 44 U/L -   CBC Latest Ref Rng & Units 10/03/2018  WBC 4.0 - 10.5 K/uL 13.2(H)  Hemoglobin 13.0 - 17.0 g/dL 9.8(L)  Hematocrit 39.0 - 52.0 % 31.1(L)  Platelets 150 - 400 K/uL 408(H)    RADIOGRAPHIC STUDIES: I have personally reviewed the radiological images as listed and agreed with the findings in the report.  Ct Abdomen Wo Contrast  Result Date: 09/28/2018 CLINICAL DATA:  Dysphagia. Evaluate anatomy prior to potential percutaneous gastrostomy tube placement EXAM: CT ABDOMEN WITHOUT CONTRAST TECHNIQUE: Multidetector CT imaging of the abdomen was performed following the standard protocol without IV contrast. COMPARISON:  CT abdomen pelvis-01/20/2017; abdominal ultrasound-09/20/2018 FINDINGS: Lack of intravenous contrast limits the ability to evaluate solid abdominal organs. Lower chest: Limited visualization of the lower thorax demonstrates small bilateral effusions with associated bibasilar consolidative opacities. Normal heart size. Coronary artery calcifications. Trace amount of pericardial fluid, presumably physiologic. There is diffuse decreased attenuation of the intra cardiac blood pool suggestive of anemia. Hepatobiliary: Normal hepatic contour. Scattered punctate granuloma are seen within the liver. Normal noncontrast appearance of the gallbladder given degree distention. No radiopaque gallstones. No ascites. Pancreas: Normal  noncontrast appearance of the pancreas. Spleen: Punctate granuloma within otherwise normal-appearing spleen. Adrenals/Urinary Tract: Note is made of 3 nonobstructing left-sided renal stones with dominant nonobstructing stone within the interpolar aspect of the left kidney measuring 1.1 x 0.7 cm (coronal image 46, series 5). Note is made of a punctate (approximately 3 mm) nonobstructing stone within the inferior pole of the right kidney (coronal image 9, series 5). Previously characterized bilateral renal cysts are grossly unchanged with dominant partially exophytic cyst arising from the inferior pole of the right kidney measuring approximately 8.0 x 6.1 cm and is again noted to have thin peripheral mural calcifications, unchanged compared to contrast-enhanced CT scan performed 01/20/2017. No urinary obstruction. Normal noncontrast appearance of the bilateral adrenal glands. The urinary bladder was not imaged. Stomach/Bowel: The anterior wall of the stomach is well apposed against the ventral wall of the upper abdomen without interposed liver or transverse colon. No hiatal hernia. Ingested enteric contrast is seen within the colon. No definite evidence of enteric obstruction. No pneumoperitoneum, pneumatosis or portal venous gas. Vascular/Lymphatic: Atherosclerotic plaque within a normal caliber abdominal aorta. No bulky retroperitoneal, mesenteric, pelvic or inguinal lymphadenopathy on this noncontrast examination. Other: Regional soft tissues are normal. Musculoskeletal: No acute or aggressive osseous abnormalities. Stigmata of DISH within the thoracic spine. IMPRESSION: 1. Gastric anatomy amenable to attempted percutaneous gastrostomy tube placement as indicated. 2. Small bilateral effusions with associated basilar opacities - atelectasis versus infiltrate. 3. Sequela of prior granulomatous infection as above. 4.  Aortic Atherosclerosis (ICD10-I70.0). Electronically Signed   By: Sandi Mariscal M.D.   On: 09/28/2018  16:48   Dg Chest 1 View  Result Date: 10/04/2018 CLINICAL DATA:  Cough EXAM: CHEST  1 VIEW COMPARISON:  09/19/2018 FINDINGS: Cardiac shadows within normal limits. Aortic calcifications are again seen. Old rib fractures are noted with healing on the right. Multiple skin folds are noted bilaterally. The lungs are well aerated without focal infiltrate or sizable effusion. No pneumothorax is noted. IMPRESSION: No acute abnormality noted. Electronically Signed   By: Inez Catalina M.D.   On: 10/04/2018 16:10   Dg Chest 1 View  Result Date: 09/19/2018 CLINICAL DATA:  85 year old male with  weakness and fever. COVID-19 status pending. EXAM: CHEST  1 VIEW COMPARISON:  CT Abdomen and Pelvis 01/20/2017. FINDINGS: Portable AP upright view at 1506 hours. Calcified aortic atherosclerosis. Other mediastinal contours are within normal limits. Visualized tracheal air column is within normal limits. Lung volumes are at the upper limits of normal. No pneumothorax, pleural effusion or pulmonary edema. No confluent pulmonary opacity. External button artifact suspected over the anterior 2nd rib on image #1. Chronic appearing posterior right rib fractures. Negative visible bowel gas pattern. IMPRESSION: No acute cardiopulmonary abnormality. Electronically Signed   By: Genevie Ann M.D.   On: 09/19/2018 15:39   Dg Pelvis 1-2 Views  Result Date: 09/20/2018 CLINICAL DATA:  Multiple falls.  Bilateral hip pain and weakness. EXAM: PELVIS - 1-2 VIEW COMPARISON:  CT 01/20/2017. FINDINGS: Diffuse osteopenia. Degenerative change both hips. No evidence of fracture or dislocation. A stable tiny sclerotic density noted in the left pubis consistent with a bone island. Pelvic calcifications consistent phleboliths. Peripheral vascular calcification. IMPRESSION: 1. Diffuse osteopenia. Degenerative change both hips. No acute bony abnormality. 2.  Peripheral vascular disease. Electronically Signed   By: Marcello Moores  Register   On: 09/20/2018 09:56   Ct  Head Wo Contrast  Result Date: 09/21/2018 CLINICAL DATA:  Follow-up examination for subdural hemorrhage. EXAM: CT HEAD WITHOUT CONTRAST TECHNIQUE: Contiguous axial images were obtained from the base of the skull through the vertex without intravenous contrast. COMPARISON:  Previous brain MRI from earlier the same day. FINDINGS: Brain: Again seen is a left holo hemispheric subdural hematoma. This measures up to 8 mm in maximal dimension at the level of the left frontal lobe. Extension along the falx, with left parafalcine component measuring up to 6 mm. Mild mass effect with associated trace 2 mm left-to-right shift. Overall, appearance is stable from previous MRI. No other definite acute intracranial hemorrhage. No acute large vessel territory infarct. Previously identified punctate left frontal cortical infarct not seen by CT. Underlying atrophy with chronic microvascular ischemic disease noted. No mass lesion. No hydrocephalus. Vascular: No hyperdense vessel. Scattered vascular calcifications noted within the carotid siphons. Skull: Evolving left frontal scalp contusion/hematoma. Calvarium intact. Sinuses/Orbits: Globes and orbital soft tissues within normal limits. Paranasal sinuses and mastoid air cells are clear. Other: None. IMPRESSION: 1. Stable size and appearance of left holo hemispheric subdural hematoma measuring up to 8 mm in maximal dimension. Associated trace 2 mm left-to-right shift. 2. Evolving left frontal scalp contusion/hematoma. 3. No other new acute intracranial process. Electronically Signed   By: Jeannine Boga M.D.   On: 09/21/2018 18:45   Ct Head Wo Contrast  Result Date: 09/19/2018 CLINICAL DATA:  Pt states he fell and hit his head on the bath tub today. Pt denies LOC EXAM: CT HEAD WITHOUT CONTRAST TECHNIQUE: Contiguous axial images were obtained from the base of the skull through the vertex without intravenous contrast. COMPARISON:  None. FINDINGS: Brain: No evidence of acute  infarction, hemorrhage, extra-axial collection, ventriculomegaly, or mass effect. Generalized cerebral atrophy. Periventricular white matter low attenuation likely secondary to microangiopathy. Vascular: Cerebrovascular atherosclerotic calcifications are noted. Skull: Negative for fracture or focal lesion. Sinuses/Orbits: Visualized portions of the orbits are unremarkable. Visualized portions of the paranasal sinuses and mastoid air cells are unremarkable. Other: Large left frontal scalp hematoma. IMPRESSION: No acute intracranial pathology. Electronically Signed   By: Kathreen Devoid   On: 09/19/2018 15:42   Mr Brain Wo Contrast  Result Date: 09/21/2018 CLINICAL DATA:  Initial evaluation for acute speech difficulty, recent falls. EXAM:  MRI HEAD WITHOUT CONTRAST TECHNIQUE: Multiplanar, multiecho pulse sequences of the brain and surrounding structures were obtained without intravenous contrast. COMPARISON:  Prior CT from 09/19/2018. FINDINGS: Brain: Generalized age-related cerebral atrophy with mild chronic small vessel ischemic disease. Remote lacunar infarct present at the right external capsule/lentiform nucleus. Small amount of associated chronic hemosiderin staining. There has been interval development of an acute subdural hematoma overlying the left cerebral convexity. This measures up to 8 mm in maximal diameter. Extension along the falx. Associated trace 2 mm left-to-right shift. No hydrocephalus or ventricular trapping. Basilar cisterns remain patent. 4 mm focus of diffusion abnormality involving the left frontal cortex at the level of the operculum suspicious for a small acute ischemic infarct (series 7, image 22). No associated hemorrhage. No other evidence for acute or subacute ischemia. Gray-white matter differentiation otherwise maintained. Pituitary gland prominent with abnormal convex border superiorly (series 9, image 12). No other mass lesion. Midline structures intact. Vascular: Major intracranial  vascular flow voids are maintained. Skull and upper cervical spine: Craniocervical junction within normal limits. Multilevel degenerative spondylolysis noted within the upper cervical spine without high-grade stenosis. Bone marrow signal intensity within normal limits. Evolving soft tissue contusion/hematoma at the left frontal scalp. Sinuses/Orbits: Globes and orbital soft tissues within normal limits. Paranasal sinuses are clear. Small right mastoid effusion noted, of doubtful significance. Inner ear structures grossly normal. Other: None. IMPRESSION: 1. Delayed intracranial hemorrhage, with interval development of acute left subdural hematoma, measuring up to 8 mm in maximal thickness. Associated mild mass effect with trace 2 mm left-to-right shift. 2. 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 3. Underlying age-related cerebral atrophy with mild chronic microvascular ischemic disease. 4. Abnormal in prominent appearance of the pituitary gland, raising the possibility for an underlying pituitary lesion. Correlation with laboratory values recommended. Additionally, further assessment with dedicated pituitary protocol MRI suggested for further evaluation. This could be performed on a nonemergent outpatient basis. Critical Value/emergent results were called by telephone at the time of interpretation on 09/21/2018 at 2:30 pm to Dr. Fritzi Mandes , who verbally acknowledged these results. Electronically Signed   By: Jeannine Boga M.D.   On: 09/21/2018 14:37   US Abdomen Complete  Result Date: 09/20/2018 CLINICAL DATA:  Elevated liver function tests. EXAM: ABDOMEN ULTRASOUND COMPLETE COMPARISON:  CT abdomen and pelvis 01/20/2017. FINDINGS: Gallbladder: No gallstones or wall thickening visualized. No sonographic Murphy sign noted by sonographer. Common bile duct: Diameter: 0.2 cm Liver: No focal lesion identified. Within normal limits in parenchymal  echogenicity. Portal vein is patent on color Doppler imaging with normal direction of blood flow towards the liver. IVC: No abnormality visualized. Pancreas: Visualized portion unremarkable. Spleen: Size and appearance within normal limits. A few small calcifications in the spleen consistent with old granulomatous disease are seen as on prior CT. Right Kidney: Length: 12.5 cm. Echogenicity within normal limits. No solid mass or hydronephrosis visualized. 7.4 cm in diameter cyst is identified as seen on prior CT. Calcification in the wall of this cyst is unchanged. Left Kidney: Length: 12.5 cm. Echogenicity within normal limits. No solid mass or hydronephrosis visualized. Two simple cysts measuring 2.0 and 1.8 cm in diameter are unchanged. A 1.5 cm stone in the mid to lower pole is identified and was present on the prior CT. Abdominal aorta: No aneurysm visualized.  Atherosclerosis noted. Other findings: None. IMPRESSION: No acute abnormality.  Normal-appearing liver and gallbladder. Nonobstructing stone lower pole left kidney. Bilateral renal cysts. Atherosclerosis. Electronically  Signed   By: Inge Rise M.D.   On: 09/20/2018 10:24    Assessment and plan-  Patient is a 85 y.o. male history of hypertension, history of prostate cancer, presented for evaluation of multiple falls and generalized weakness.   #Anemia improving.  Hemoglobin 9.8 today.  Continue to monitor. EGD 7/6 showed nonbleeding duodenal ulcer. Preadmission baseline unknown.  Hemoglobin in 2018 was around 13. No overt evidence of hemolysis.  LDH was previously elevated, which is trending down now indirect bilirubin normal. Haptoglobin pending. Multiple myeloma panel negative.  No M protein Underlying MDS need to be ruled out if hemoglobin persistently trending down.outpatient BM if so.    #Coagulopathy: was given FFP prior to PEG placement. Continue to trend. Possible vitamin K deficiency due to antibiotic use/poor nutrition  status.   # History of prostate cancer, outpatient follow up. PSA elevated at 12.82. outpatient follow up  # High ferritin, likely reactive due to acute illness, trending down   #Intracranial hematoma/bleeding, secondary to injury.    Thank you for allowing me to participate in the care of this patient.    Earlie Server, MD, PhD 10/04/2018

## 2018-10-05 ENCOUNTER — Telehealth: Payer: Self-pay | Admitting: Gastroenterology

## 2018-10-05 ENCOUNTER — Inpatient Hospital Stay: Payer: Medicare Other

## 2018-10-05 LAB — CBC
HCT: 26.5 % — ABNORMAL LOW (ref 39.0–52.0)
Hemoglobin: 8.5 g/dL — ABNORMAL LOW (ref 13.0–17.0)
MCH: 28.1 pg (ref 26.0–34.0)
MCHC: 32.1 g/dL (ref 30.0–36.0)
MCV: 87.5 fL (ref 80.0–100.0)
Platelets: 346 10*3/uL (ref 150–400)
RBC: 3.03 MIL/uL — ABNORMAL LOW (ref 4.22–5.81)
RDW: 13.3 % (ref 11.5–15.5)
WBC: 11.8 10*3/uL — ABNORMAL HIGH (ref 4.0–10.5)
nRBC: 0 % (ref 0.0–0.2)

## 2018-10-05 LAB — GLUCOSE, CAPILLARY
Glucose-Capillary: 148 mg/dL — ABNORMAL HIGH (ref 70–99)
Glucose-Capillary: 155 mg/dL — ABNORMAL HIGH (ref 70–99)
Glucose-Capillary: 147 mg/dL — ABNORMAL HIGH (ref 70–99)
Glucose-Capillary: 143 mg/dL — ABNORMAL HIGH (ref 70–99)

## 2018-10-05 LAB — PHOSPHORUS: Phosphorus: 2.1 mg/dL — ABNORMAL LOW (ref 2.5–4.6)

## 2018-10-05 LAB — BASIC METABOLIC PANEL
Anion gap: 7 (ref 5–15)
BUN: 21 mg/dL (ref 8–23)
CO2: 26 mmol/L (ref 22–32)
Calcium: 8.1 mg/dL — ABNORMAL LOW (ref 8.9–10.3)
Chloride: 99 mmol/L (ref 98–111)
Creatinine, Ser: 0.77 mg/dL (ref 0.61–1.24)
GFR calc Af Amer: >60 mL/min (ref >60)
GFR calc non Af Amer: >60 mL/min (ref >60)
Glucose, Bld: 163 mg/dL — ABNORMAL HIGH (ref 70–99)
Potassium: 3.6 mmol/L (ref 3.5–5.1)
Sodium: 132 mmol/L — ABNORMAL LOW (ref 135–145)

## 2018-10-05 LAB — RETIC PANEL
Immature Retic Fract: 9.8 % (ref 2.3–15.9)
RBC.: 2.95 MIL/uL — ABNORMAL LOW (ref 4.22–5.81)
Retic Count, Absolute: 84.4 10*3/uL (ref 19.0–186.0)
Retic Ct Pct: 2.9 % (ref 0.4–3.1)
Reticulocyte Hemoglobin: 26.1 pg — ABNORMAL LOW (ref >27.9)

## 2018-10-05 LAB — PROTIME-INR
INR: 1.2 (ref 0.8–1.2)
Prothrombin Time: 15.5 seconds — ABNORMAL HIGH (ref 11.4–15.2)

## 2018-10-05 LAB — MAGNESIUM: Magnesium: 1.9 mg/dL (ref 1.7–2.4)

## 2018-10-05 LAB — APTT: aPTT: 51 seconds — ABNORMAL HIGH (ref 24–36)

## 2018-10-05 MED ORDER — POTASSIUM CHLORIDE 20 MEQ PO PACK
20.0000 meq | PACK | Freq: Once | ORAL | Status: AC
  Administered 2018-10-05: 09:00:00 20 meq
  Filled 2018-10-05: qty 1

## 2018-10-05 MED ORDER — SODIUM CHLORIDE 0.9 % IV SOLN
INTRAVENOUS | Status: DC
  Administered 2018-10-05 – 2018-10-10 (×8): via INTRAVENOUS

## 2018-10-05 MED ORDER — POTASSIUM PHOSPHATES 15 MMOLE/5ML IV SOLN
10.0000 mmol | Freq: Once | INTRAVENOUS | Status: AC
  Administered 2018-10-05: 10 mmol via INTRAVENOUS
  Filled 2018-10-05: qty 3.33

## 2018-10-05 MED ORDER — GUAIFENESIN-DM 100-10 MG/5ML PO SYRP
5.0000 mL | ORAL_SOLUTION | ORAL | Status: DC | PRN
  Administered 2018-10-05: 5 mL via ORAL
  Filled 2018-10-05 (×2): qty 5

## 2018-10-05 NOTE — TOC Progression Note (Signed)
Transition of Care [facility]) - Progression Note    Patient Details  Name: [name] MRN: [id number] Date of Birth: [birth year]  [facility] CM/SW Contact  [name], LCSW Phone Number: 10/05/2018, 2:39 PM  Clinical Narrative:     Pt not stable for dc today per MD. New COVID test was ordered earlier today at the request of [facility] in the event pt would be stable for dc today. Updated [name] at [facility] that pt unable to dc today. TOC will follow up tomorrow to further assist with pt's dc planning needs.  Expected Discharge Plan: Skilled Nursing Facility Barriers to Discharge: Continued Medical Work up  Expected Discharge Plan and Services Expected Discharge Plan: [place]   Discharge Planning Services: CM Consult                                           Social Determinants of Health (SDOH) Interventions    Readmission Risk Interventions Readmission Risk Prevention Plan 09/21/2018  Post Dischage Appt Complete  Medication Screening Complete  Transportation Screening Complete  Some recent data might be hidden

## 2018-10-05 NOTE — Progress Notes (Addendum)
[facility] [facility] [place] NAME: [name]    MR#:  [id number]  DATE OF BIRTH:  [birth year]  SUBJECTIVE:  Continues to have some soreness around the PEG site. Feels about the same as yesterday. He had orthostatic hypotension again when working with PT this afternoon.  REVIEW OF SYSTEMS:   Review of Systems  Constitutional: Negative for chills, fever and weight loss.  HENT: Negative for ear discharge, ear pain and nosebleeds.   Eyes: Negative for blurred vision, pain and discharge.  Respiratory: Negative for sputum production, shortness of breath, wheezing and stridor.   Cardiovascular: Negative for chest pain, palpitations, orthopnea and PND.  Gastrointestinal: Negative for abdominal pain, diarrhea, melena, nausea and vomiting.  Genitourinary: Negative for frequency and urgency.  Musculoskeletal: Negative for back pain, falls and joint pain.  Neurological: Positive for weakness. Negative for sensory change, speech change and focal weakness.  Psychiatric/Behavioral: Negative for depression and hallucinations. The patient is not nervous/anxious.     DRUG ALLERGIES:  No Known Allergies  VITALS:  Blood pressure 111/74, pulse (!) 103, temperature 98.5 F (36.9 C), temperature source Oral, resp. rate 19, height 6' (1.829 m), weight 60.5 kg, SpO2 100 %.  PHYSICAL EXAMINATION:   Physical Exam  GENERAL:  85 y.o.-year-old patient lying in the bed with no acute distress.  EYES: Pupils equal, round, reactive to light and accommodation. No scleral icterus. Extraocular muscles intact.  HEENT: Head atraumatic, normocephalic. Oropharynx and nasopharynx clear.  + Ecchymoses on his forehead NECK:  Supple, no jugular venous distention. No thyroid enlargement, no tenderness.  LUNGS: Normal breath sounds bilaterally, no wheezing, rales, rhonchi. No use of accessory muscles of respiration.  CARDIOVASCULAR: RRR, S1, S2 normal. No murmurs, rubs, or  gallops.  ABDOMEN: Soft, nontender, nondistended. Bowel sounds present. No organomegaly or mass.  EXTREMITIES: No cyanosis, clubbing or edema b/l.    NEUROLOGIC: Cranial nerves II through XII are intact. No focal Motor or sensory deficits b/l.  + Global weakness. PSYCHIATRIC:  patient is alert and oriented x 3.  SKIN: No obvious rash, lesion, or ulcer.   LABORATORY PANEL:  CBC Recent Labs  Lab 10/05/18 0639  WBC 11.8*  HGB 8.5*  HCT 26.5*  PLT 346    Chemistries  Recent Labs  Lab 10/03/18 0439  10/05/18 0639  NA 135   < > 132*  K 3.6   < > 3.6  CL 101   < > 99  CO2 27   < > 26  GLUCOSE 110*   < > 163*  BUN 7*   < > 21  CREATININE 0.56*   < > 0.77  CALCIUM 8.3*   < > 8.1*  MG  --    < > 1.9  AST 25  --   --   ALT 24  --   --   ALKPHOS 77  --   --   BILITOT 1.4*  --   --    < > = values in this interval not displayed.   Cardiac Enzymes No results for input(s): TROPONINI in the last 168 hours. RADIOLOGY:  Dg Chest 1 View  Result Date: 10/04/2018 CLINICAL DATA:  Cough EXAM: CHEST  1 VIEW COMPARISON:  09/19/2018 FINDINGS: Cardiac shadows within normal limits. Aortic calcifications are again seen. Old rib fractures are noted with healing on the right. Multiple skin folds are noted bilaterally. The lungs are well aerated without focal infiltrate or sizable effusion. No pneumothorax is noted.  IMPRESSION: No acute abnormality noted. Electronically Signed   By: Inez Catalina M.D.   On: 10/04/2018 16:10   ASSESSMENT AND PLAN:   85 year old male with history of hypertension who has had decreased appetite for several weeks who presents after multiple falls today and syncopal episode.  Severe persistent dysphagia -Patient had MBS done -s/p PEG placement 7/6 -Continue tube feeds -Seen by palliative care, who recommended outpatient palliative f/u  Left subdural hematoma s/p fall at home with left frontal cortex CVA -Repeat CT head 6/23 showed a stable hematoma -Holding aspirin  and DVT prophylaxis -Patient has been evaluated by neurosurgery -Repeat CT head today  Syncope with hypotension secondary dehydration and hypokalemia- patient continues to be lightheaded when working with PT. -Start IVFs -Will check orthostatic vitals in the morning -Recent ECHO with normal EF -Will repeat CT had given patient's known subdural hematoma  Tachycardia- resolved. -Recent TSH normal -Continue metoprolol 25mg  bid -Continue cardiac monitoring  Leukocytosis- likely a stress response after PEG placement.  May also be due to some hemoconcentration.  No infectious symptoms. Improving. 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 -Seen by GI-patient preferred outpatient colonoscopy -EGD 7/6 with a nonbleeding duodenal ulcer  Urinary retention -Continue Foley  Hypertension- BPs soft -Holding home lisinopril  History of prostate cancer -Monitor  CODE STATUS: full  DVT Prophylaxis: SCD  Plan for discharge to SNF when medically able, possibly tomorrow.  TOTAL TIME TAKING CARE OF THIS PATIENT: 45 minutes.  >50% time spent on counselling and coordination of care  Note: This dictation was prepared with Dragon dictation along with smaller phrase technology. Any transcriptional errors that result from this process are unintentional.  Evette Doffing M.D on 10/05/2018 at 2:01 PM  Between 7am to 6pm - Pager - [phone]  After 6pm go to [email or web address] - password [email or web address] [facility]  Office  [phone]  CC: Primary care physician; [name], MDPatient ID: Mindi Junker, male   DOB: Aug 01, 1933, 85 y.o.   MRN: [id number]

## 2018-10-05 NOTE — Consult Note (Signed)
PHARMACY CONSULT NOTE - FOLLOW UP  Pharmacy Consult for Electrolyte Monitoring and Replacement   Recent Labs: Potassium (mmol/L)  Date Value  10/03/2018 4.3   Magnesium (mg/dL)  Date Value  10/03/2018 1.9   Calcium (mg/dL)  Date Value  10/03/2018 8.4 (L)   Albumin (g/dL)  Date Value  10/03/2018 2.7 (L)   Phosphorus (mg/dL)  Date Value  09/29/2018 2.5   Sodium (mmol/L)  Date Value  10/03/2018 133 (L)  Corrected Ca: 9.14 mg/dL  Assessment: Patient admitted with hypokalemia. Pharmacy consulted for electrolyte monitoring and replacement. Patient unable to take oral replacement at this time.  PEG placement on 7/6 and he started tube feeds yesterday. Feeding supplement: osmolite 1.5 @ goal rate of 58ml/hr. Patient is at risk for refeeding.   Phosphorus is not at goal and potassium is on the lower end of normal range.   Goal of Therapy:  Electrolytes WNL  Plan:   Will order 10 mmol Potassium Phosphate IV x 1 dose.   Will order KCL PO 20 mEq x1 dose.  Will F/U with AM labs and continue to replace electrolytes as needed.   Rowland Lathe, PharmD Clinical Pharmacist 10/05/2018 7:15 AM

## 2018-10-05 NOTE — Progress Notes (Addendum)
Daily Progress Note   Patient Name: [name]       Date: 10/05/2018 DOB: 14-Jun-1933  Age: 85 y.o. MRN#: [id number] Attending Physician: Sela Hua, MD Primary Care Physician: [name], MD Admit Date: 09/19/2018  Reason for Consultation/Follow-up: Establishing goals of care  Subjective: Patient is resting in bed. He is s/p PEG tube placement. He is no longer requiring Amioderone. He states he has tenderness in his abdomen. He is happy with his decision for PEG placement and states he is feeling better. He is frail with plans for D/C to SNF.   He states he is continuing to take one day at a time.    Length of Stay: 16  Current Medications: Scheduled Meds:  . sodium chloride   Intravenous Once  . free water  75 mL Per Tube Q4H  . metoprolol tartrate  25 mg Per Tube BID  . multivitamin with minerals  1 tablet Oral Daily  . neomycin-bacitracin-polymyxin   Topical Daily  . pantoprazole (PROTONIX) IV  40 mg Intravenous Q12H  . vitamin C  250 mg Per Tube BID    Continuous Infusions: . feeding supplement (OSMOLITE 1.5 CAL) 1,000 mL (10/05/18 0635)  . potassium PHOSPHATE IVPB (in mmol)      PRN Meds: acetaminophen, HYDROcodone-acetaminophen, morphine injection, ondansetron **OR** ondansetron (ZOFRAN) IV, polyethylene glycol  Physical Exam HENT:     Head: Atraumatic.  Pulmonary:     Effort: Pulmonary effort is normal.  Skin:    General: Skin is warm and dry.  Neurological:     Mental Status: He is alert.             Vital Signs: BP 119/75 (BP Location: Left Arm)   Pulse 85   Temp 98.4 F (36.9 C) (Oral)   Resp 19   Ht 6' (1.829 m)   Wt 60.5 kg   SpO2 99%   BMI 18.09 kg/m  SpO2: SpO2: 99 % O2 Device: O2 Device: Room Air O2 Flow Rate: O2 Flow Rate  (L/min): 2 L/min  Intake/output summary:   Intake/Output Summary (Last 24 hours) at 10/05/2018 0919 Last data filed at 10/05/2018 0113 Gross per 24 hour  Intake 264 ml  Output 725 ml  Net -461 ml   LBM: Last BM Date: 10/01/18 Baseline Weight: Weight: 61.2 kg  Most recent weight: Weight: 60.5 kg       Palliative Assessment/Data: NPO      Patient Active Problem List   Diagnosis Date Noted  . Coagulopathy ([place])   . Pressure injury of skin 10/03/2018  . Dysphagia   . Anemia   . Thrombocytopenia ([place])   . Blood in stool   . Elevated transaminase level   . Syncope 09/19/2018    Palliative Care Assessment & Plan    Recommendations/Plan: Continuing tube feeds. Recommend palliative at D/C for ongoing [place] discussions over time.       Code Status:    Code Status Orders  (From admission, onward)         Start     Ordered   09/19/18 2317  Full code  Continuous     09/19/18 2316        Code Status History    Date Active Date Inactive Code Status Order ID Comments User Context   09/19/2018 1812 09/19/2018 2316 DNR [id number]  Bettey Costa, MD ED   Advance Care Planning Activity    Advance Directive Documentation     Most Recent Value  Type of Advance Directive  Healthcare Power of Attorney  Pre-existing out of facility DNR order (yellow form or pink MOST form)  -  "MOST" Form in Place?  -       Prognosis:   Unable to determine  Discharge Planning:  SNF with palliative.    Thank you for allowing the Palliative Medicine Team to assist in the care of this patient.   Total Time 15 min Prolonged Time Billed  no      Greater than 50%  of this time was spent counseling and coordinating care related to the above assessment and plan.  Asencion Gowda, NP  Please contact Palliative Medicine Team phone at [phone] for questions and concerns.

## 2018-10-05 NOTE — Telephone Encounter (Signed)
Virgel Manifold, MD  [place], Judee Clara, Corliss Marcus, Takari Lundahl L        Please set up clinic appointment with Dr. Vicente Males for hospital follow up in 4-6 weeks

## 2018-10-05 NOTE — Progress Notes (Signed)
PT Cancellation Note  Patient Details Name: [name] MRN: [id number] DOB: [birth year]   Cancelled Treatment:    Reason Eval/Treat Not Completed: Medical issues which prohibited therapy   Discussed with RN prior to session.  She had recently attempted orthostatic BP's with pt and he passed out in standing during attempts.  Requested hold session for today.  Will continue as appropriate tomorrow.   Chesley Noon 10/05/2018, 1:32 PM

## 2018-10-06 LAB — BASIC METABOLIC PANEL
Anion gap: 6 (ref 5–15)
BUN: 17 mg/dL (ref 8–23)
CO2: 27 mmol/L (ref 22–32)
Calcium: 8 mg/dL — ABNORMAL LOW (ref 8.9–10.3)
Chloride: 100 mmol/L (ref 98–111)
Creatinine, Ser: 0.69 mg/dL (ref 0.61–1.24)
GFR calc Af Amer: >60 mL/min (ref >60)
GFR calc non Af Amer: >60 mL/min (ref >60)
Glucose, Bld: 130 mg/dL — ABNORMAL HIGH (ref 70–99)
Potassium: 4.2 mmol/L (ref 3.5–5.1)
Sodium: 133 mmol/L — ABNORMAL LOW (ref 135–145)

## 2018-10-06 LAB — GLUCOSE, CAPILLARY
Glucose-Capillary: 108 mg/dL — ABNORMAL HIGH (ref 70–99)
Glucose-Capillary: 110 mg/dL — ABNORMAL HIGH (ref 70–99)
Glucose-Capillary: 122 mg/dL — ABNORMAL HIGH (ref 70–99)
Glucose-Capillary: 136 mg/dL — ABNORMAL HIGH (ref 70–99)

## 2018-10-06 LAB — PHOSPHORUS: Phosphorus: 1.7 mg/dL — ABNORMAL LOW (ref 2.5–4.6)

## 2018-10-06 LAB — MAGNESIUM: Magnesium: 1.8 mg/dL (ref 1.7–2.4)

## 2018-10-06 MED ORDER — SODIUM PHOSPHATES 45 MMOLE/15ML IV SOLN
20.0000 mmol | Freq: Once | INTRAVENOUS | Status: AC
  Administered 2018-10-06: 20 mmol via INTRAVENOUS
  Filled 2018-10-06: qty 6.67

## 2018-10-06 MED ORDER — MAGNESIUM SULFATE 2 GM/50ML IV SOLN
2.0000 g | Freq: Once | INTRAVENOUS | Status: AC
  Administered 2018-10-06: 2 g via INTRAVENOUS
  Filled 2018-10-06: qty 50

## 2018-10-06 NOTE — Progress Notes (Signed)
Physical Therapy Treatment Patient Details Name: [name] MRN: [id number] DOB: 30-May-1933 Today's Date: 10/06/2018    History of Present Illness "Bill" Sharol Roussel is an 84yoM admitted for syncope with multiple falls at home. Complains of hip pain upon arrival to hospital. History includes HTN and prostate cancer.  Noted multiple abrasions to body. Underwent PEG placement on 7/6.    PT Comments    Pt in bed, feeling better.  RN reports continued orthostatic this am but ok for session as tolerated.  Participated in exercises as described below.  To edge of bed with assist for blankets and tubing but no physical assist.  Once sitting steady.  Stood with min a and was able to stand for BP's and standing exercises.  Marches and SLR x 10 before fatigue.  Returned to bed with min assist.    BP supine 137/79 P 84 BP sitting 124/72 P 89 BP standing 103/72 P 99 BP 3 min 116/74 P 96 Asymptomatic - no c/o dizziness during session.   Follow Up Recommendations  SNF     Equipment Recommendations  Rolling walker with 5" wheels    Recommendations for Other Services       Precautions / Restrictions Precautions Precautions: Fall Precaution Comments: High Fall Risk, multiple fall history Restrictions Weight Bearing Restrictions: No    Mobility  Bed Mobility Overal bed mobility: Needs Assistance Bed Mobility: Supine to Sit;Sit to Supine     Supine to sit: Min assist Sit to supine: Min assist      Transfers Overall transfer level: Needs assistance Equipment used: Rolling walker (2 wheeled) Transfers: Sit to/from Stand Sit to Stand: Min assist            Ambulation/Gait             General Gait Details: deferred   Stairs             Wheelchair Mobility    Modified Rankin (Stroke Patients Only)       Balance Overall balance assessment: Needs assistance Sitting-balance support: Bilateral upper extremity supported Sitting balance-Leahy Scale: Fair      Standing balance support: Bilateral upper extremity supported Standing balance-Leahy Scale: Fair                              Cognition Arousal/Alertness: Awake/alert Behavior During Therapy: WFL for tasks assessed/performed Overall Cognitive Status: Within Functional Limits for tasks assessed                                        Exercises Other Exercises Other Exercises: standing marches and SLR x 10  Supine ankle pumps, heel slides,ab/add and SLR x 10    General Comments        Pertinent Vitals/Pain Pain Assessment: No/denies pain    Home Living                      Prior Function            PT Goals (current goals can now be found in the care plan section) Progress towards PT goals: Progressing toward goals    Frequency    Min 2X/week      PT Plan Current plan remains appropriate    Co-evaluation              AM-PAC PT "6  Clicks" Mobility   Outcome Measure  Help needed turning from your back to your side while in a flat bed without using bedrails?: A Little Help needed moving from lying on your back to sitting on the side of a flat bed without using bedrails?: A Little Help needed moving to and from a bed to a chair (including a wheelchair)?: A Little Help needed standing up from a chair using your arms (e.g., wheelchair or bedside chair)?: A Little Help needed to walk in hospital room?: A Lot Help needed climbing 3-5 steps with a railing? : A Lot 6 Click Score: 16    End of Session Equipment Utilized During Treatment: Gait belt Activity Tolerance: Patient tolerated treatment well;Patient limited by fatigue Patient left: in bed;with call bell/phone within reach;with bed alarm set Nurse Communication: Mobility status       Time: 0225-0250 PT Time Calculation (min) (ACUTE ONLY): 25 min  Charges:  $Therapeutic Exercise: 8-22 mins $Therapeutic Activity: 8-22 mins                    Chesley Noon,  PTA 10/06/18, 3:08 PM

## 2018-10-06 NOTE — Progress Notes (Signed)
RN updated daughter on pt condition. RN also notified MD to call and update pt family per daughter request. I will continue to assess.

## 2018-10-06 NOTE — Care Management Important Message (Signed)
Important Message  Patient Details  Name: [name] MRN: [id number] Date of Birth: 1934/03/08   Medicare Important Message Given:  Yes     Dannette Barbara 10/06/2018, 1:08 PM

## 2018-10-06 NOTE — Consult Note (Signed)
PHARMACY CONSULT NOTE - FOLLOW UP  Pharmacy Consult for Electrolyte Monitoring and Replacement   Recent Labs: Potassium (mmol/L)  Date Value  10/06/2018 4.2   Magnesium (mg/dL)  Date Value  10/06/2018 1.8   Calcium (mg/dL)  Date Value  10/06/2018 8.0 (L)   Albumin (g/dL)  Date Value  10/03/2018 2.7 (L)   Phosphorus (mg/dL)  Date Value  10/06/2018 1.7 (L)   Sodium (mmol/L)  Date Value  10/06/2018 133 (L)  Corrected Ca: 9.04 mg/dL  Scr: 0.56>> 0.69 >>0.77 >> 0.69  Assessment: Patient admitted with hypokalemia. Pharmacy consulted for electrolyte monitoring and replacement. Patient unable to take oral replacement at this time.  PEG placement on 7/6 and he started tube feeds yesterday. Feeding supplement: osmolite 1.5 @ goal rate of 42ml/hr. Patient is at risk for refeeding.   Magnesium is not at goal, per our protocol, and patient remains hypophosphatemia.   Goal of Therapy:  Electrolytes WNL  Plan:   Will order 20 mmol Sodium Phosphate IV x1 dose.  Will order Magnesium IV 2 g x 1 dose  Will F/U with AM labs and continue to replace electrolytes as needed.   Rowland Lathe, PharmD Clinical Pharmacist 10/06/2018 7:27 AM

## 2018-10-06 NOTE — Progress Notes (Signed)
[facility] at [place] NAME: [name]    MR#:  [id number]  DATE OF BIRTH:  January 19, 1934  SUBJECTIVE:  Patient states he is feeling much better today compared to yesterday.  He had positive orthostatic vitals this morning, where his blood pressure dropped when going from sitting to standing.  He denied any additional episodes of lightheadedness during that time.  No issues with the PEG tube.  REVIEW OF SYSTEMS:   Review of Systems  Constitutional: Negative for chills, fever and weight loss.  HENT: Negative for ear discharge, ear pain and nosebleeds.   Eyes: Negative for blurred vision, pain and discharge.  Respiratory: Negative for sputum production, shortness of breath, wheezing and stridor.   Cardiovascular: Negative for chest pain, palpitations, orthopnea and PND.  Gastrointestinal: Negative for abdominal pain, diarrhea, melena, nausea and vomiting.  Genitourinary: Negative for frequency and urgency.  Musculoskeletal: Negative for back pain, falls and joint pain.  Neurological: Positive for weakness. Negative for sensory change, speech change and focal weakness.  Psychiatric/Behavioral: Negative for depression and hallucinations. The patient is not nervous/anxious.     DRUG ALLERGIES:  No Known Allergies  VITALS:  Blood pressure 107/66, pulse (!) 111, temperature 98.5 F (36.9 C), temperature source Oral, resp. rate 18, height 6' (1.829 m), weight 60.5 kg, SpO2 98 %.  PHYSICAL EXAMINATION:   Physical Exam  GENERAL:  85 y.o.-year-old patient lying in the bed with no acute distress.  EYES: Pupils equal, round, reactive to light and accommodation. No scleral icterus. Extraocular muscles intact.  HEENT: Head atraumatic, normocephalic. Oropharynx and nasopharynx clear.  + Ecchymoses on his forehead NECK:  Supple, no jugular venous distention. No thyroid enlargement, no tenderness.  LUNGS: Normal breath sounds bilaterally, no  wheezing, rales, rhonchi. No use of accessory muscles of respiration.  CARDIOVASCULAR: RRR, S1, S2 normal. No murmurs, rubs, or gallops.  ABDOMEN: Soft, nontender, nondistended. Bowel sounds present. No organomegaly or mass.  EXTREMITIES: No cyanosis, clubbing or edema b/l.    NEUROLOGIC: Cranial nerves II through XII are intact. No focal Motor or sensory deficits b/l.  + Global weakness. PSYCHIATRIC:  patient is alert and oriented x 3.  SKIN: No obvious rash, lesion, or ulcer.   LABORATORY PANEL:  CBC Recent Labs  Lab 10/05/18 0639  WBC 11.8*  HGB 8.5*  HCT 26.5*  PLT 346    Chemistries  Recent Labs  Lab 10/03/18 0439  10/06/18 0458  NA 135   < > 133*  K 3.6   < > 4.2  CL 101   < > 100  CO2 27   < > 27  GLUCOSE 110*   < > 130*  BUN 7*   < > 17  CREATININE 0.56*   < > 0.69  CALCIUM 8.3*   < > 8.0*  MG  --    < > 1.8  AST 25  --   --   ALT 24  --   --   ALKPHOS 77  --   --   BILITOT 1.4*  --   --    < > = values in this interval not displayed.   Cardiac Enzymes No results for input(s): TROPONINI in the last 168 hours. RADIOLOGY:  Dg Chest 1 View  Result Date: 10/04/2018 CLINICAL DATA:  Cough EXAM: CHEST  1 VIEW COMPARISON:  09/19/2018 FINDINGS: Cardiac shadows within normal limits. Aortic calcifications are again seen. Old rib fractures are noted with healing on the  right. Multiple skin folds are noted bilaterally. The lungs are well aerated without focal infiltrate or sizable effusion. No pneumothorax is noted. IMPRESSION: No acute abnormality noted. Electronically Signed   By: Inez Catalina M.D.   On: 10/04/2018 16:10   Ct Head Wo Contrast  Result Date: 10/05/2018 CLINICAL DATA:  Left subdural hemorrhage. EXAM: CT HEAD WITHOUT CONTRAST TECHNIQUE: Contiguous axial images were obtained from the base of the skull through the vertex without intravenous contrast. COMPARISON:  CT scan of September 21, 2018. FINDINGS: Brain: Mild diffuse cortical atrophy is noted. Mild chronic  ischemic white matter disease is noted. Subdural hematoma noted over left cerebral cortex on prior exam is again noted and grossly stable in size. Maximum thickness is stable approximately 8 mm. There does appear to be slightly increased left tentorial and posterior left para falcine hematoma. Ventricular size is within normal limits. Vascular: No hyperdense vessel or unexpected calcification. Skull: Normal. Negative for fracture or focal lesion. Sinuses/Orbits: No acute finding. Other: None. IMPRESSION: Subdural hematoma overlying left cerebral cortex noted on prior exam is again noted and grossly stable in size, although appears to be more heterogeneous in density compared to prior exam. There does appear to be slightly increased subdural hematoma involving the left posterior parafalcine region and tentorial region. No significant midline shift is noted currently. Electronically Signed   By: Marijo Conception M.D.   On: 10/05/2018 15:01   ASSESSMENT AND PLAN:   85 year old male with history of hypertension who has had decreased appetite for several weeks who presents after multiple falls today and syncopal episode.  Severe persistent dysphagia -Patient had MBS done -s/p PEG placement 7/6 -Continue tube feeds -Seen by palliative care, who recommended outpatient palliative f/u  Left subdural hematoma s/p fall at home with left frontal cortex CVA -Repeat CT head 6/23 showed a stable hematoma -Holding aspirin and DVT prophylaxis -Patient has been evaluated by neurosurgery -Repeat CT head today  Orthostatic hypotension- patient had positive orthostatic vitals this morning, but did not feel lightheaded. -Continue IV fluids throughout the day today -Recheck orthostatic vitals in the morning -Recent ECHO with normal EF -Repeat CT head was stable -Holding home lisinopril  Tachycardia- resolved. -Recent TSH normal -Continue metoprolol 25mg  bid -Continue cardiac monitoring  Ehrlichia chaffensis  infection in the setting of tick bite -Has received full course of doxycycline -Seen by ID  Acute on chronic anemia with melena and left subdural hematoma- hemoglobin has been stable. -Seen by GI-patient preferred outpatient colonoscopy -EGD 7/6 with a nonbleeding duodenal ulcer  Urinary retention -Continue Foley  Hypertension- BPs soft -Holding home lisinopril  History of prostate cancer -Monitor  CODE STATUS: full  DVT Prophylaxis: SCD  Plan for discharge to SNF tomorrow.  TOTAL TIME TAKING CARE OF THIS PATIENT: 40 minutes.  >50% time spent on counselling and coordination of care  Note: This dictation was prepared with Dragon dictation along with smaller phrase technology. Any transcriptional errors that result from this process are unintentional.  Evette Doffing M.D on 10/06/2018 at 3:14 PM  Between 7am to 6pm - Pager - [phone]  After 6pm go to [email or web address] - password [email or web address] [facility]  Office  [phone]  CC: Primary care physician; [name], MDPatient ID: Mindi Junker, male   DOB: May 16, 1933, 85 y.o.   MRN: [id number]

## 2018-10-07 ENCOUNTER — Inpatient Hospital Stay: Payer: Medicare Other

## 2018-10-07 DIAGNOSIS — D500 Iron deficiency anemia secondary to blood loss (chronic): Secondary | ICD-10-CM

## 2018-10-07 LAB — BASIC METABOLIC PANEL
Anion gap: 3 — ABNORMAL LOW (ref 5–15)
BUN: 13 mg/dL (ref 8–23)
CO2: 27 mmol/L (ref 22–32)
Calcium: 7.8 mg/dL — ABNORMAL LOW (ref 8.9–10.3)
Chloride: 104 mmol/L (ref 98–111)
Creatinine, Ser: 0.66 mg/dL (ref 0.61–1.24)
GFR calc Af Amer: >60 mL/min (ref >60)
GFR calc non Af Amer: >60 mL/min (ref >60)
Glucose, Bld: 115 mg/dL — ABNORMAL HIGH (ref 70–99)
Potassium: 4.1 mmol/L (ref 3.5–5.1)
Sodium: 134 mmol/L — ABNORMAL LOW (ref 135–145)

## 2018-10-07 LAB — GLUCOSE, CAPILLARY
Glucose-Capillary: 110 mg/dL — ABNORMAL HIGH (ref 70–99)
Glucose-Capillary: 131 mg/dL — ABNORMAL HIGH (ref 70–99)
Glucose-Capillary: 108 mg/dL — ABNORMAL HIGH (ref 70–99)

## 2018-10-07 LAB — URINALYSIS, COMPLETE (UACMP) WITH MICROSCOPIC
Bilirubin Urine: NEGATIVE
Glucose, UA: NEGATIVE mg/dL
Hgb urine dipstick: NEGATIVE
Ketones, ur: NEGATIVE mg/dL
Leukocytes,Ua: NEGATIVE
Nitrite: NEGATIVE
Protein, ur: NEGATIVE mg/dL
Specific Gravity, Urine: 1.011 (ref 1.005–1.030)
pH: 8 (ref 5.0–8.0)

## 2018-10-07 LAB — CBC
HCT: 25.3 % — ABNORMAL LOW (ref 39.0–52.0)
Hemoglobin: 8 g/dL — ABNORMAL LOW (ref 13.0–17.0)
MCH: 29 pg (ref 26.0–34.0)
MCHC: 31.6 g/dL (ref 30.0–36.0)
MCV: 91.7 fL (ref 80.0–100.0)
Platelets: 370 10*3/uL (ref 150–400)
RBC: 2.76 MIL/uL — ABNORMAL LOW (ref 4.22–5.81)
RDW: 14 % (ref 11.5–15.5)
WBC: 11.4 10*3/uL — ABNORMAL HIGH (ref 4.0–10.5)
nRBC: 0 % (ref 0.0–0.2)

## 2018-10-07 LAB — RETIC PANEL
Immature Retic Fract: 10.4 % (ref 2.3–15.9)
RBC.: 2.77 MIL/uL — ABNORMAL LOW (ref 4.22–5.81)
Retic Count, Absolute: 57.3 10*3/uL (ref 19.0–186.0)
Retic Ct Pct: 2.1 % (ref 0.4–3.1)
Reticulocyte Hemoglobin: 23 pg — ABNORMAL LOW (ref >27.9)

## 2018-10-07 LAB — PHOSPHORUS: Phosphorus: 2 mg/dL — ABNORMAL LOW (ref 2.5–4.6)

## 2018-10-07 LAB — PROCALCITONIN: Procalcitonin: 0.58 ng/mL

## 2018-10-07 LAB — MAGNESIUM: Magnesium: 2 mg/dL (ref 1.7–2.4)

## 2018-10-07 MED ORDER — SODIUM PHOSPHATES 45 MMOLE/15ML IV SOLN
20.0000 mmol | Freq: Once | INTRAVENOUS | Status: AC
  Administered 2018-10-07: 20 mmol via INTRAVENOUS
  Filled 2018-10-07: qty 6.67

## 2018-10-07 MED ORDER — FERROUS SULFATE 325 (65 FE) MG PO TABS
325.0000 mg | ORAL_TABLET | Freq: Every day | ORAL | Status: DC
  Administered 2018-10-08 – 2018-10-11 (×4): 325 mg via ORAL
  Filled 2018-10-07 (×4): qty 1

## 2018-10-07 NOTE — TOC Progression Note (Signed)
Transition of Care [facility]) - Progression Note    Patient Details  Name: [name] MRN: [id number] Date of Birth: 20-Dec-1933  Transition of Care [facility]) CM/SW Contact  Ross Ludwig, [place] Phone Number: 10/07/2018, 6:00 PM  Clinical Narrative:     CSW received phone call from [employer], they have still not received insurance authorization.  Insurance company was requesting prior level of functioning, CSW faxed PT evaluation to [employer] so they can send the information to [employer].  CSW continuing to follow patient's progress throughout discharge planning.    Expected Discharge Plan: Skilled Nursing Facility Barriers to Discharge: Continued Medical Work up  Expected Discharge Plan and Services Expected Discharge Plan: [place]   Discharge Planning Services: CM Consult                                           Social Determinants of Health (SDOH) Interventions    Readmission Risk Interventions Readmission Risk Prevention Plan 09/21/2018  Post Dischage Appt Complete  Medication Screening Complete  Transportation Screening Complete  Some recent data might be hidden

## 2018-10-07 NOTE — Progress Notes (Signed)
Hematology/Oncology Progress Note [facility] Telephone:([phone] [phone]  Patient Care Team: [name], MD as PCP - General (Family Medicine)   Name of the patient: [name]  [id number]  83-13-1935  Date of visit: 10/07/18   INTERVAL HISTORY-  Patient is lying in the bed comfortably Speech has improved. Patient informs me that he just exercised with physical therapist for about an hour. No acute overnight events. Denies any pain.  No new complaints    Review of systems- Review of Systems  Constitutional: Positive for fatigue. Negative for chills and fever.  Respiratory: Negative for chest tightness and cough.   Cardiovascular: Negative for chest pain.  Gastrointestinal: Negative for abdominal pain and nausea.  Genitourinary: Negative for difficulty urinating.   Skin: Negative for rash.  Neurological: Negative for headaches.  Psychiatric/Behavioral: Negative for confusion.    No Known Allergies  Patient Active Problem List   Diagnosis Date Noted   Coagulopathy ([facility])    Pressure injury of skin 10/03/2018   Dysphagia    Anemia    Thrombocytopenia ([facility])    Blood in stool    Elevated transaminase level    Syncope 09/19/2018     Past Medical History:  Diagnosis Date   Arthritis    Cancer [facility])    Prostate Cancer   Hypertension      Past Surgical History:  Procedure Laterality Date   ESOPHAGOGASTRODUODENOSCOPY (EGD) WITH PROPOFOL N/A 12/06/2017   Procedure: ESOPHAGOGASTRODUODENOSCOPY (EGD) WITH PROPOFOL;  Surgeon: Jonathon Bellows, MD;  [facility];  Service: Gastroenterology;  Laterality: N/A;   PEG PLACEMENT N/A 10/03/2018   Procedure: PERCUTANEOUS ENDOSCOPIC GASTROSTOMY (PEG) PLACEMENT;  Surgeon: Lucilla Lame, MD;  Location: [facility] ENDOSCOPY;  Service: Endoscopy;  Laterality: N/A;   PROSTATE CRYOABLATION      Social History   Socioeconomic History   Marital status: Widowed    Spouse  name: Not on file   Number of children: Not on file   Years of education: Not on file   Highest education level: Not on file  Occupational History   Not on file  Social Needs   Financial resource strain: Not on file   Food insecurity    Worry: Not on file    Inability: Not on file   Transportation needs    Medical: Not on file    Non-medical: Not on file  Tobacco Use   Smoking status: Former Smoker    Types: Pipe    Quit date: 03/30/2002    Years since quitting: 16.5   Smokeless tobacco: Never Used  Substance and Sexual Activity   Alcohol use: Yes   Drug use: Never   Sexual activity: Not on file  Lifestyle   Physical activity    Days per week: Not on file    Minutes per session: Not on file   Stress: Not on file  Relationships   Social connections    Talks on phone: Not on file    Gets together: Not on file    Attends religious service: Not on file    Active member of club or organization: Not on file    Attends meetings of clubs or organizations: Not on file    Relationship status: Not on file   Intimate partner violence    Fear of current or ex partner: Not on file    Emotionally abused: Not on file    Physically abused: Not on file    Forced sexual activity: Not on file  Other Topics Concern   Not on file  Social History Narrative   Not on file     History reviewed. No pertinent family history.   Current Facility-Administered Medications:    0.9 %  sodium chloride infusion (Manually program via Guardrails IV Fluids), , Intravenous, Once, Allen Norris, Darren, MD   0.9 %  sodium chloride infusion, , Intravenous, Continuous, Mayo, Pete Pelt, MD, Last Rate: 75 mL/hr at 10/06/18 2337   acetaminophen (TYLENOL) tablet 650 mg, 650 mg, Oral, Q6H PRN, Mayo, Pete Pelt, MD, 650 mg at 10/06/18 1408   feeding supplement (OSMOLITE 1.5 CAL) liquid 1,000 mL, 1,000 mL, Per Tube, Continuous, Mayo, Pete Pelt, MD, Last Rate: 55 mL/hr at 10/05/18 0635, 1,000 mL at  10/05/18 0635   free water 75 mL, 75 mL, Per Tube, Q4H, Mayo, Pete Pelt, MD, 75 mL at 10/07/18 1023   guaiFENesin-dextromethorphan (ROBITUSSIN DM) 100-10 MG/5ML syrup 5 mL, 5 mL, Oral, Q4H PRN, Mayo, Pete Pelt, MD, 5 mL at 10/05/18 2015   HYDROcodone-acetaminophen (NORCO/VICODIN) 5-325 MG per tablet 1 tablet, 1 tablet, Oral, Q6H PRN, Lucilla Lame, MD   metoprolol tartrate (LOPRESSOR) tablet 25 mg, 25 mg, Per Tube, BID, Mayo, Pete Pelt, MD, 25 mg at 10/07/18 1022   morphine 2 MG/ML injection 1 mg, 1 mg, Intravenous, Q4H PRN, Lucilla Lame, MD, 1 mg at 10/04/18 1017   multivitamin with minerals tablet 1 tablet, 1 tablet, Oral, Daily, Lucilla Lame, MD, 1 tablet at 10/07/18 1022   neomycin-bacitracin-polymyxin (NEOSPORIN) ointment packet, , Topical, Daily, Lucilla Lame, MD, 1 application at 40/98/11 1051   ondansetron (ZOFRAN) tablet 4 mg, 4 mg, Oral, Q6H PRN **OR** ondansetron (ZOFRAN) injection 4 mg, 4 mg, Intravenous, Q6H PRN, Allen Norris, Darren, MD   pantoprazole (PROTONIX) injection 40 mg, 40 mg, Intravenous, Q12H, Wohl, Darren, MD, 40 mg at 10/07/18 1022   polyethylene glycol (MIRALAX / GLYCOLAX) packet 17 g, 17 g, Oral, Daily PRN, Allen Norris, Darren, MD   sodium phosphate 20 mmol in dextrose 5 % 250 mL infusion, 20 mmol, Intravenous, Once, Duncan, Asajah R, RPH   vitamin C (ASCORBIC ACID) tablet 250 mg, 250 mg, Per Tube, BID, Mayo, Pete Pelt, MD, 250 mg at 10/07/18 1022   Physical exam:  Vitals:   10/07/18 0751 10/07/18 0753 10/07/18 0854 10/07/18 0914  BP: (!) 146/122 (!) 94/57 (P) 122/79 119/80  Pulse: 95 (!) 114 (P) 97 (!) 117  Resp: 20 20    Temp: 98.2 F (36.8 C) 99.3 F (37.4 C)    TempSrc: Oral Oral    SpO2: 100% 100%    Weight:      Height:       Physical Exam  Constitutional: No distress.  Frail appearance, lying the bed  HENT:  Mouth/Throat: No oropharyngeal exudate.  Left forehead bruising Oral mucosa dry  Eyes: Pupils are equal, round, and reactive to light. EOM are  normal. No scleral icterus.  Neck: Normal range of motion. Neck supple.  Cardiovascular: Normal rate and regular rhythm.  No murmur heard. Pulmonary/Chest: Effort normal. No respiratory distress.  Abdominal: Soft. He exhibits no distension.  PEG tube in place.    Musculoskeletal: Normal range of motion.        General: No edema.  Neurological: He is alert.  Speech is close to his baseline.  Skin: Skin is warm and dry. He is not diaphoretic. No erythema.  Bruises  Psychiatric: Affect normal.       CMP Latest Ref Rng & Units 10/07/2018  Glucose 70 -  99 mg/dL 115(H)  BUN 8 - 23 mg/dL 13  Creatinine 0.61 - 1.24 mg/dL 0.66  Sodium 135 - 145 mmol/L 134(L)  Potassium 3.5 - 5.1 mmol/L 4.1  Chloride 98 - 111 mmol/L 104  CO2 22 - 32 mmol/L 27  Calcium 8.9 - 10.3 mg/dL 7.8(L)  Total Protein 6.5 - 8.1 g/dL -  Total Bilirubin 0.3 - 1.2 mg/dL -  Alkaline Phos 38 - 126 U/L -  AST 15 - 41 U/L -  ALT 0 - 44 U/L -   CBC Latest Ref Rng & Units 10/07/2018  WBC 4.0 - 10.5 K/uL 11.4(H)  Hemoglobin 13.0 - 17.0 g/dL 8.0(L)  Hematocrit 39.0 - 52.0 % 25.3(L)  Platelets 150 - 400 K/uL 370    RADIOGRAPHIC STUDIES: I have personally reviewed the radiological images as listed and agreed with the findings in the report.  Ct Abdomen Wo Contrast  Result Date: 09/28/2018 CLINICAL DATA:  Dysphagia. Evaluate anatomy prior to potential percutaneous gastrostomy tube placement EXAM: CT ABDOMEN WITHOUT CONTRAST TECHNIQUE: Multidetector CT imaging of the abdomen was performed following the standard protocol without IV contrast. COMPARISON:  CT abdomen pelvis-01/20/2017; abdominal ultrasound-09/20/2018 FINDINGS: Lack of intravenous contrast limits the ability to evaluate solid abdominal organs. Lower chest: Limited visualization of the lower thorax demonstrates small bilateral effusions with associated bibasilar consolidative opacities. Normal heart size. Coronary artery calcifications. Trace amount of pericardial  fluid, presumably physiologic. There is diffuse decreased attenuation of the intra cardiac blood pool suggestive of anemia. Hepatobiliary: Normal hepatic contour. Scattered punctate granuloma are seen within the liver. Normal noncontrast appearance of the gallbladder given degree distention. No radiopaque gallstones. No ascites. Pancreas: Normal noncontrast appearance of the pancreas. Spleen: Punctate granuloma within otherwise normal-appearing spleen. Adrenals/Urinary Tract: Note is made of 3 nonobstructing left-sided renal stones with dominant nonobstructing stone within the interpolar aspect of the left kidney measuring 1.1 x 0.7 cm (coronal image 46, series 5). Note is made of a punctate (approximately 3 mm) nonobstructing stone within the inferior pole of the right kidney (coronal image 9, series 5). Previously characterized bilateral renal cysts are grossly unchanged with dominant partially exophytic cyst arising from the inferior pole of the right kidney measuring approximately 8.0 x 6.1 cm and is again noted to have thin peripheral mural calcifications, unchanged compared to contrast-enhanced CT scan performed 01/20/2017. No urinary obstruction. Normal noncontrast appearance of the bilateral adrenal glands. The urinary bladder was not imaged. Stomach/Bowel: The anterior wall of the stomach is well apposed against the ventral wall of the upper abdomen without interposed liver or transverse colon. No hiatal hernia. Ingested enteric contrast is seen within the colon. No definite evidence of enteric obstruction. No pneumoperitoneum, pneumatosis or portal venous gas. Vascular/Lymphatic: Atherosclerotic plaque within a normal caliber abdominal aorta. No bulky retroperitoneal, mesenteric, pelvic or inguinal lymphadenopathy on this noncontrast examination. Other: Regional soft tissues are normal. Musculoskeletal: No acute or aggressive osseous abnormalities. Stigmata of DISH within the thoracic spine. IMPRESSION: 1.  Gastric anatomy amenable to attempted percutaneous gastrostomy tube placement as indicated. 2. Small bilateral effusions with associated basilar opacities - atelectasis versus infiltrate. 3. Sequela of prior granulomatous infection as above. 4.  Aortic Atherosclerosis (ICD10-I70.0). Electronically Signed   By: Sandi Mariscal M.D.   On: 09/28/2018 16:48   Dg Chest 1 View  Result Date: 10/07/2018 CLINICAL DATA:  85 year old male with history of fever. Lightheadedness. Former smoker. EXAM: CHEST  1 VIEW COMPARISON:  Chest x-ray 10/04/2018. FINDINGS: Extensive skin fold artifact projecting over the lateral  right hemithorax. Lung volumes are normal. No consolidative airspace disease. No pleural effusions. No pneumothorax. No pulmonary nodule or mass noted. Pulmonary vasculature and the cardiomediastinal silhouette are within normal limits. Aortic atherosclerosis. Old healed right-sided rib fractures are again incidentally noted. IMPRESSION: 1. No radiographic evidence of acute cardiopulmonary disease. 2. Aortic atherosclerosis. Electronically Signed   By: Vinnie Langton M.D.   On: 10/07/2018 08:34   Dg Chest 1 View  Result Date: 10/04/2018 CLINICAL DATA:  Cough EXAM: CHEST  1 VIEW COMPARISON:  09/19/2018 FINDINGS: Cardiac shadows within normal limits. Aortic calcifications are again seen. Old rib fractures are noted with healing on the right. Multiple skin folds are noted bilaterally. The lungs are well aerated without focal infiltrate or sizable effusion. No pneumothorax is noted. IMPRESSION: No acute abnormality noted. Electronically Signed   By: Inez Catalina M.D.   On: 10/04/2018 16:10   Dg Chest 1 View  Result Date: 09/19/2018 CLINICAL DATA:  85 year old male with weakness and fever. COVID-19 status pending. EXAM: CHEST  1 VIEW COMPARISON:  CT Abdomen and Pelvis 01/20/2017. FINDINGS: Portable AP upright view at 1506 hours. Calcified aortic atherosclerosis. Other mediastinal contours are within normal  limits. Visualized tracheal air column is within normal limits. Lung volumes are at the upper limits of normal. No pneumothorax, pleural effusion or pulmonary edema. No confluent pulmonary opacity. External button artifact suspected over the anterior 2nd rib on image #1. Chronic appearing posterior right rib fractures. Negative visible bowel gas pattern. IMPRESSION: No acute cardiopulmonary abnormality. Electronically Signed   By: Genevie Ann M.D.   On: 09/19/2018 15:39   Dg Pelvis 1-2 Views  Result Date: 09/20/2018 CLINICAL DATA:  Multiple falls.  Bilateral hip pain and weakness. EXAM: PELVIS - 1-2 VIEW COMPARISON:  CT 01/20/2017. FINDINGS: Diffuse osteopenia. Degenerative change both hips. No evidence of fracture or dislocation. A stable tiny sclerotic density noted in the left pubis consistent with a bone island. Pelvic calcifications consistent phleboliths. Peripheral vascular calcification. IMPRESSION: 1. Diffuse osteopenia. Degenerative change both hips. No acute bony abnormality. 2.  Peripheral vascular disease. Electronically Signed   By: Marcello Moores  Register   On: 09/20/2018 09:56   Ct Head Wo Contrast  Result Date: 10/05/2018 CLINICAL DATA:  Left subdural hemorrhage. EXAM: CT HEAD WITHOUT CONTRAST TECHNIQUE: Contiguous axial images were obtained from the base of the skull through the vertex without intravenous contrast. COMPARISON:  CT scan of September 21, 2018. FINDINGS: Brain: Mild diffuse cortical atrophy is noted. Mild chronic ischemic white matter disease is noted. Subdural hematoma noted over left cerebral cortex on prior exam is again noted and grossly stable in size. Maximum thickness is stable approximately 8 mm. There does appear to be slightly increased left tentorial and posterior left para falcine hematoma. Ventricular size is within normal limits. Vascular: No hyperdense vessel or unexpected calcification. Skull: Normal. Negative for fracture or focal lesion. Sinuses/Orbits: No acute finding.  Other: None. IMPRESSION: Subdural hematoma overlying left cerebral cortex noted on prior exam is again noted and grossly stable in size, although appears to be more heterogeneous in density compared to prior exam. There does appear to be slightly increased subdural hematoma involving the left posterior parafalcine region and tentorial region. No significant midline shift is noted currently. Electronically Signed   By: Marijo Conception M.D.   On: 10/05/2018 15:01   Ct Head Wo Contrast  Result Date: 09/21/2018 CLINICAL DATA:  Follow-up examination for subdural hemorrhage. EXAM: CT HEAD WITHOUT CONTRAST TECHNIQUE: Contiguous axial images were obtained  from the base of the skull through the vertex without intravenous contrast. COMPARISON:  Previous brain MRI from earlier the same day. FINDINGS: Brain: Again seen is a left holo hemispheric subdural hematoma. This measures up to 8 mm in maximal dimension at the level of the left frontal lobe. Extension along the falx, with left parafalcine component measuring up to 6 mm. Mild mass effect with associated trace 2 mm left-to-right shift. Overall, appearance is stable from previous MRI. No other definite acute intracranial hemorrhage. No acute large vessel territory infarct. Previously identified punctate left frontal cortical infarct not seen by CT. Underlying atrophy with chronic microvascular ischemic disease noted. No mass lesion. No hydrocephalus. Vascular: No hyperdense vessel. Scattered vascular calcifications noted within the carotid siphons. Skull: Evolving left frontal scalp contusion/hematoma. Calvarium intact. Sinuses/Orbits: Globes and orbital soft tissues within normal limits. Paranasal sinuses and mastoid air cells are clear. Other: None. IMPRESSION: 1. Stable size and appearance of left holo hemispheric subdural hematoma measuring up to 8 mm in maximal dimension. Associated trace 2 mm left-to-right shift. 2. Evolving left frontal scalp contusion/hematoma.  3. No other new acute intracranial process. Electronically Signed   By: Jeannine Boga M.D.   On: 09/21/2018 18:45   Ct Head Wo Contrast  Result Date: 09/19/2018 CLINICAL DATA:  Pt states he fell and hit his head on the bath tub today. Pt denies LOC EXAM: CT HEAD WITHOUT CONTRAST TECHNIQUE: Contiguous axial images were obtained from the base of the skull through the vertex without intravenous contrast. COMPARISON:  None. FINDINGS: Brain: No evidence of acute infarction, hemorrhage, extra-axial collection, ventriculomegaly, or mass effect. Generalized cerebral atrophy. Periventricular white matter low attenuation likely secondary to microangiopathy. Vascular: Cerebrovascular atherosclerotic calcifications are noted. Skull: Negative for fracture or focal lesion. Sinuses/Orbits: Visualized portions of the orbits are unremarkable. Visualized portions of the paranasal sinuses and mastoid air cells are unremarkable. Other: Large left frontal scalp hematoma. IMPRESSION: No acute intracranial pathology. Electronically Signed   By: Kathreen Devoid   On: 09/19/2018 15:42   Mr Brain Wo Contrast  Result Date: 09/21/2018 CLINICAL DATA:  Initial evaluation for acute speech difficulty, recent falls. EXAM: MRI HEAD WITHOUT CONTRAST TECHNIQUE: Multiplanar, multiecho pulse sequences of the brain and surrounding structures were obtained without intravenous contrast. COMPARISON:  Prior CT from 09/19/2018. FINDINGS: Brain: Generalized age-related cerebral atrophy with mild chronic small vessel ischemic disease. Remote lacunar infarct present at the right external capsule/lentiform nucleus. Small amount of associated chronic hemosiderin staining. There has been interval development of an acute subdural hematoma overlying the left cerebral convexity. This measures up to 8 mm in maximal diameter. Extension along the falx. Associated trace 2 mm left-to-right shift. No hydrocephalus or ventricular trapping. Basilar cisterns remain  patent. 4 mm focus of diffusion abnormality involving the left frontal cortex at the level of the operculum suspicious for a small acute ischemic infarct (series 7, image 22). No associated hemorrhage. No other evidence for acute or subacute ischemia. Gray-white matter differentiation otherwise maintained. Pituitary gland prominent with abnormal convex border superiorly (series 9, image 12). No other mass lesion. Midline structures intact. Vascular: Major intracranial vascular flow voids are maintained. Skull and upper cervical spine: Craniocervical junction within normal limits. Multilevel degenerative spondylolysis noted within the upper cervical spine without high-grade stenosis. Bone marrow signal intensity within normal limits. Evolving soft tissue contusion/hematoma at the left frontal scalp. Sinuses/Orbits: Globes and orbital soft tissues within normal limits. Paranasal sinuses are clear. Small right mastoid effusion noted, of doubtful significance. [place]  ear structures grossly normal. Other: None. IMPRESSION: 1. Delayed intracranial hemorrhage, with interval development of acute left subdural hematoma, measuring up to 8 mm in maximal thickness. Associated mild mass effect with trace 2 mm left-to-right shift. 2. 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 3. Underlying age-related cerebral atrophy with mild chronic microvascular ischemic disease. 4. Abnormal in prominent appearance of the pituitary gland, raising the possibility for an underlying pituitary lesion. Correlation with laboratory values recommended. Additionally, further assessment with dedicated pituitary protocol MRI suggested for further evaluation. This could be performed on a nonemergent outpatient basis. Critical Value/emergent results were called by telephone at the time of interpretation on 09/21/2018 at 2:30 pm to Dr. Fritzi Mandes , who verbally acknowledged these results. Electronically  Signed   By: Jeannine Boga M.D.   On: 09/21/2018 14:37   US Abdomen Complete  Result Date: 09/20/2018 CLINICAL DATA:  Elevated liver function tests. EXAM: ABDOMEN ULTRASOUND COMPLETE COMPARISON:  CT abdomen and pelvis 01/20/2017. FINDINGS: Gallbladder: No gallstones or wall thickening visualized. No sonographic Murphy sign noted by sonographer. Common bile duct: Diameter: 0.2 cm Liver: No focal lesion identified. Within normal limits in parenchymal echogenicity. Portal vein is patent on color Doppler imaging with normal direction of blood flow towards the liver. IVC: No abnormality visualized. Pancreas: Visualized portion unremarkable. Spleen: Size and appearance within normal limits. A few small calcifications in the spleen consistent with old granulomatous disease are seen as on prior CT. Right Kidney: Length: 12.5 cm. Echogenicity within normal limits. No solid mass or hydronephrosis visualized. 7.4 cm in diameter cyst is identified as seen on prior CT. Calcification in the wall of this cyst is unchanged. Left Kidney: Length: 12.5 cm. Echogenicity within normal limits. No solid mass or hydronephrosis visualized. Two simple cysts measuring 2.0 and 1.8 cm in diameter are unchanged. A 1.5 cm stone in the mid to lower pole is identified and was present on the prior CT. Abdominal aorta: No aneurysm visualized.  Atherosclerosis noted. Other findings: None. IMPRESSION: No acute abnormality.  Normal-appearing liver and gallbladder. Nonobstructing stone lower pole left kidney. Bilateral renal cysts. Atherosclerosis. Electronically Signed   By: Inge Rise M.D.   On: 09/20/2018 10:24    Assessment and plan-  Patient is a 85 y.o. male history of hypertension, history of prostate cancer, presented for evaluation of multiple falls and generalized weakness.   #Anemia,   Hemoglobin 8.0 today, gradually decreasing. Reticulocyte panel showed decreased reticulocyte hemoglobin, 23, which an indication of  iron deficient. EGD 10/03/2018 showed nonbleeding duodenal ulcer. Hemoccult positive previously. Clinically I think he is developing iron deficiency  Ferritin was checked previously and was elevated initially above 7500 on 09/21/2018, repeat level 09/27/2018 with a level of 829.  Ferritin increase is secondary to acute issues.  Currently reticulocyte hemoglobin service is more accurate reflector of his iron stores.  Recommend start iron supplementation ferrous sulfate 325mg  daily via PEG tube. He may also have underlying MDS which may be rule out. He can follow up outpatient. Office will call patient to arrange follow up appointment.   # History of prostate cancer, outpatient follow up. PSA elevated at 12.82. outpatient follow up #Intracranial hematoma/bleeding, secondary to injury.  PT/OT.   Attempt to call daughter to update her, daughter did not answer her phone.  Thank you for allowing me to participate in the care of this patient.    Earlie Server, MD, PhD 10/07/2018

## 2018-10-07 NOTE — Consult Note (Signed)
PHARMACY CONSULT NOTE - FOLLOW UP  Pharmacy Consult for Electrolyte Monitoring and Replacement   Recent Labs: Potassium (mmol/L)  Date Value  10/07/2018 4.1   Magnesium (mg/dL)  Date Value  10/07/2018 2.0   Calcium (mg/dL)  Date Value  10/07/2018 7.8 (L)   Albumin (g/dL)  Date Value  10/03/2018 2.7 (L)   Phosphorus (mg/dL)  Date Value  10/07/2018 2.0 (L)   Sodium (mmol/L)  Date Value  10/07/2018 134 (L)  Corrected Ca: 8.84 mg/dL  Scr: 0.56>> 0.69 >>0.77 >> 0.69 >> 0.66  Assessment: Patient admitted with hypokalemia. Pharmacy consulted for electrolyte monitoring and replacement. Patient unable to take oral replacement at this time.  PEG placement on 7/6 and he started tube feeds yesterday. Feeding supplement: osmolite 1.5 @ goal rate of 37ml/hr. Patient is at risk for refeeding.   Patient remains hypophosphatemia.   Goal of Therapy:  Electrolytes WNL  Plan:   Will order 20 mmol Sodium Phosphate IV x1 dose.   Will F/U with AM labs and continue to replace electrolytes as needed.   Rowland Lathe, PharmD Clinical Pharmacist 10/07/2018 7:15 AM

## 2018-10-07 NOTE — Progress Notes (Signed)
Physical Therapy Treatment Patient Details Name: [name] MRN: [id number] DOB: 10-04-1933 Today's Date: 10/07/2018    History of Present Illness "[name]" [name] is an 84yoM admitted for syncope with multiple falls at home. Complains of hip pain upon arrival to hospital. History includes HTN and prostate cancer.  Noted multiple abrasions to body. Underwent PEG placement on 7/6.    PT Comments    Pt awake upon entry, agreeable to partcipate. Extensive bed exercise program reviewed with overall great tolerance, no need for physical assist, except when performing bridging wherein [name] provides support to maintain feet in place. Pt able to transition to EOB and STS 6x with stable BP and without presyncopal prodrome. Pt advanced to AMB into hallway a short distance, but he asks to turn around, complaining for feeling very cold.  At end of AMB, HR at 117 BPM, SBP dropping <4mmHg, and pt still without presyncopal prodrome. Pt progressing nicely overall. Pt declines OOB to chair at end of session.     Follow Up Recommendations  SNF     Equipment Recommendations  Other (comment)(to be determined by facility upon DC to home.)    Recommendations for Other Services       Precautions / Restrictions Precautions Precautions: Fall Precaution Comments: High Fall Risk, multiple fall history, acute PEG tube in place    Mobility  Bed Mobility   Bed Mobility: Supine to Sit;Sit to Supine Rolling: Min guard Sidelying to sit: Min guard Supine to sit: Min guard     General bed mobility comments: attempted to review log roll, but pt moving quickly adn does not hear/process instructions as given  Transfers Overall transfer level: Needs assistance Equipment used: Rolling walker (2 wheeled) Transfers: Sit to/from Stand           General transfer comment: 2 sets of 3 from EOB with RW, cues given for hand placement; no syncopal prodome. BP stable  120s/80s.  Ambulation/Gait Ambulation/Gait assistance: Min assist Gait Distance (Feet): 70 Feet(SBP drop <66mmHg after AMB, but pt declines additional d/t feeling cold) Assistive device: Rolling walker (2 wheeled) Gait Pattern/deviations: Step-through pattern;Decreased step length - left;Ataxic     General Gait Details: wide based gait, unsafe distance from RW at times, needs cues and assistance to manage RW at times.   Stairs             Wheelchair Mobility    Modified Rankin (Stroke Patients Only)       Balance           Standing balance support: During functional activity;Bilateral upper extremity supported Standing balance-[name]: Fair Standing balance comment: needs intermittent MinA for RW management, especially during turns                            Cognition Arousal/Alertness: Awake/alert Behavior During Therapy: WFL for tasks assessed/performed Overall Cognitive Status: Within Functional Limits for tasks assessed                                        Exercises General Exercises - Lower Extremity Ankle Circles/Pumps: AROM;Strengthening;Both Gluteal Sets: AROM;Strengthening;Both Short Arc Quad: AROM;Strengthening;Both Heel Slides: AROM;Both;Strengthening Hip ABduction/ADduction: AROM;Strengthening;Both Straight Leg Raises: AROM;Strengthening;Both Hip Flexion/Marching: AROM;Strengthening;Both Mini-Sqauts: Strengthening;Left;10 reps;Limitations Mini Squats Limitations: manually resisted Other Exercises Other Exercises: STS 2x3 from EOB Other Exercises: Bridging 10x3secH    General Comments  Pertinent Vitals/Pain Pain Assessment: No/denies pain Pain Location: some pullin gnear PEG site with standing    Home Living                      Prior Function            PT Goals (current goals can now be found in the care plan section) Acute Rehab PT Goals Patient Stated Goal: to go home PT Goal  Formulation: With patient Time For Goal Achievement: 10/18/18 Potential to Achieve Goals: Fair Progress towards PT goals: Progressing toward goals    Frequency    Min 2X/week      PT Plan Current plan remains appropriate    Co-evaluation              AM-PAC PT "6 Clicks" Mobility   Outcome Measure  Help needed turning from your back to your side while in a flat bed without using bedrails?: A Little Help needed moving from lying on your back to sitting on the side of a flat bed without using bedrails?: A Little Help needed moving to and from a bed to a chair (including a wheelchair)?: A Little Help needed standing up from a chair using your arms (e.g., wheelchair or bedside chair)?: A Little Help needed to walk in hospital room?: A Little Help needed climbing 3-5 steps with a railing? : A Little 6 Click Score: 18    End of Session Equipment Utilized During Treatment: Gait belt Activity Tolerance: Patient tolerated treatment well;Patient limited by fatigue Patient left: in bed;with call bell/phone within reach;with bed alarm set;with SCD's reapplied;Other (comment)(pt declines OOB) Nurse Communication: Mobility status PT Visit Diagnosis: Unsteadiness on feet (R26.81);Muscle weakness (generalized) (M62.81);History of falling (Z91.81);Difficulty in walking, not elsewhere classified (R26.2);Dizziness and giddiness (R42)     Time: 5003-7048 PT Time Calculation (min) (ACUTE ONLY): 45 min  Charges:  $Gait Training: 8-22 mins $Therapeutic Exercise: 23-37 mins                     9:42 AM, 10/07/18 [name], PT, DPT Physical Therapist - [facility]  [phone] ([place])    [name],[name] 10/07/2018, 9:39 AM

## 2018-10-07 NOTE — Progress Notes (Signed)
Patient unable to complete orthostatics. When patient attempted to stand up, states that he feels very lightheaded and felt that he needed to lay down. Will continue to monitor.   [name]

## 2018-10-07 NOTE — Progress Notes (Signed)
[place] [facility] [place] NAME: [name]    MR#:  [id number]  DATE OF BIRTH:  14-Mar-1934  SUBJECTIVE:   Patient did well when walking with physical therapy this morning.  He did not develop any lightheadedness or dizziness.  He denies any chest pain, shortness of breath, palpitations.  He states he did not realize he had a fever last night.  REVIEW OF SYSTEMS:   Review of Systems  Constitutional: Negative for chills, fever and weight loss.  HENT: Negative for ear discharge, ear pain and nosebleeds.   Eyes: Negative for blurred vision, pain and discharge.  Respiratory: Negative for sputum production, shortness of breath, wheezing and stridor.   Cardiovascular: Negative for chest pain, palpitations, orthopnea and PND.  Gastrointestinal: Negative for abdominal pain, diarrhea, melena, nausea and vomiting.  Genitourinary: Negative for frequency and urgency.  Musculoskeletal: Negative for back pain, falls and joint pain.  Neurological: Positive for weakness. Negative for sensory change, speech change and focal weakness.  Psychiatric/Behavioral: Negative for depression and hallucinations. The patient is not nervous/anxious.     DRUG ALLERGIES:  No Known Allergies  VITALS:  Blood pressure 119/80, pulse (!) 117, temperature 99.3 F (37.4 C), temperature source Oral, resp. rate 20, height 6' (1.829 m), weight 60.5 kg, SpO2 100 %.  PHYSICAL EXAMINATION:   Physical Exam  GENERAL:  85 y.o.-year-old patient lying in the bed with no acute distress.  EYES: Pupils equal, round, reactive to light and accommodation. No scleral icterus. Extraocular muscles intact.  HEENT: Head atraumatic, normocephalic. Oropharynx and nasopharynx clear.  + Ecchymoses on his forehead NECK:  Supple, no jugular venous distention. No thyroid enlargement, no tenderness.  LUNGS: Normal breath sounds bilaterally, no wheezing, rales, rhonchi. No use of accessory  muscles of respiration.  CARDIOVASCULAR: RRR, S1, S2 normal. No murmurs, rubs, or gallops.  ABDOMEN: Soft, nontender, nondistended. Bowel sounds present. No organomegaly or mass.  EXTREMITIES: No cyanosis, clubbing or edema b/l.    NEUROLOGIC: Cranial nerves II through XII are intact. No focal Motor or sensory deficits b/l.  + Global weakness. PSYCHIATRIC:  patient is alert and oriented x 3.  SKIN: No obvious rash, lesion, or ulcer.   LABORATORY PANEL:  CBC Recent Labs  Lab 10/07/18 0319  WBC 11.4*  HGB 8.0*  HCT 25.3*  PLT 370    Chemistries  Recent Labs  Lab 10/03/18 0439  10/07/18 0320  NA 135   < > 134*  K 3.6   < > 4.1  CL 101   < > 104  CO2 27   < > 27  GLUCOSE 110*   < > 115*  BUN 7*   < > 13  CREATININE 0.56*   < > 0.66  CALCIUM 8.3*   < > 7.8*  MG  --    < > 2.0  AST 25  --   --   ALT 24  --   --   ALKPHOS 77  --   --   BILITOT 1.4*  --   --    < > = values in this interval not displayed.   Cardiac Enzymes No results for input(s): TROPONINI in the last 168 hours. RADIOLOGY:  Dg Chest 1 View  Result Date: 10/07/2018 CLINICAL DATA:  85 year old male with history of fever. Lightheadedness. Former smoker. EXAM: CHEST  1 VIEW COMPARISON:  Chest x-ray 10/04/2018. FINDINGS: Extensive skin fold artifact projecting over the lateral right hemithorax. Lung volumes are normal.  No consolidative airspace disease. No pleural effusions. No pneumothorax. No pulmonary nodule or mass noted. Pulmonary vasculature and the cardiomediastinal silhouette are within normal limits. Aortic atherosclerosis. Old healed right-sided rib fractures are again incidentally noted. IMPRESSION: 1. No radiographic evidence of acute cardiopulmonary disease. 2. Aortic atherosclerosis. Electronically Signed   By: Vinnie Langton M.D.   On: 10/07/2018 08:34   ASSESSMENT AND PLAN:   85 year old male with history of hypertension who has had decreased appetite for several weeks who presents after multiple  falls today and syncopal episode.  Fever-unclear etiology. Temp 100.69F early this morning. 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 -Seen by GI-patient preferred outpatient colonoscopy -EGD 7/6 with a nonbleeding duodenal ulcer -Hematology recommending ferrous sulfate 325 mg daily  Urinary retention -Continue Foley  Hypertension- BPs soft -Holding home lisinopril  History of prostate cancer -Monitor  CODE STATUS: full  DVT Prophylaxis: SCDs  TOTAL TIME TAKING CARE OF THIS PATIENT: 35 minutes.  >50% time spent on counselling and coordination of care  Note: This dictation was prepared with Dragon dictation along with smaller phrase technology. Any transcriptional errors that result from this process are unintentional.  Evette Doffing M.D on 10/07/2018 at 2:52 PM  Between 7am to 6pm - Pager - [phone]  After 6pm go to [email or web address] - password [email or web address] [facility]  Office  [phone]  CC: Primary care physician; [name], MDPatient  ID: [name], male   DOB: [birth year], 85 y.o.   MRN: [id number]

## 2018-10-07 NOTE — Plan of Care (Signed)
  Problem: Clinical Measurements: Goal: Ability to maintain clinical measurements within normal limits will improve Outcome: Progressing   Problem: Activity: Goal: Risk for activity intolerance will decrease Outcome: Progressing   Problem: Pain Managment: Goal: General experience of comfort will improve Outcome: Progressing   Problem: Safety: Goal: Ability to remain free from injury will improve Outcome: Progressing   Problem: Skin Integrity: Goal: Risk for impaired skin integrity will decrease Outcome: Progressing

## 2018-10-08 LAB — GLUCOSE, CAPILLARY
Glucose-Capillary: 106 mg/dL — ABNORMAL HIGH (ref 70–99)
Glucose-Capillary: 109 mg/dL — ABNORMAL HIGH (ref 70–99)
Glucose-Capillary: 110 mg/dL — ABNORMAL HIGH (ref 70–99)
Glucose-Capillary: 112 mg/dL — ABNORMAL HIGH (ref 70–99)
Glucose-Capillary: 120 mg/dL — ABNORMAL HIGH (ref 70–99)
Glucose-Capillary: 129 mg/dL — ABNORMAL HIGH (ref 70–99)

## 2018-10-08 LAB — CBC
HCT: 25 % — ABNORMAL LOW (ref 39.0–52.0)
Hemoglobin: 7.9 g/dL — ABNORMAL LOW (ref 13.0–17.0)
MCH: 28 pg (ref 26.0–34.0)
MCHC: 31.6 g/dL (ref 30.0–36.0)
MCV: 88.7 fL (ref 80.0–100.0)
Platelets: 364 10*3/uL (ref 150–400)
RBC: 2.82 MIL/uL — ABNORMAL LOW (ref 4.22–5.81)
RDW: 13.9 % (ref 11.5–15.5)
WBC: 12.4 10*3/uL — ABNORMAL HIGH (ref 4.0–10.5)
nRBC: 0 % (ref 0.0–0.2)

## 2018-10-08 LAB — BASIC METABOLIC PANEL
Anion gap: 7 (ref 5–15)
BUN: 11 mg/dL (ref 8–23)
CO2: 26 mmol/L (ref 22–32)
Calcium: 8.2 mg/dL — ABNORMAL LOW (ref 8.9–10.3)
Chloride: 101 mmol/L (ref 98–111)
Creatinine, Ser: 0.57 mg/dL — ABNORMAL LOW (ref 0.61–1.24)
GFR calc Af Amer: >60 mL/min (ref >60)
GFR calc non Af Amer: >60 mL/min (ref >60)
Glucose, Bld: 112 mg/dL — ABNORMAL HIGH (ref 70–99)
Potassium: 4.1 mmol/L (ref 3.5–5.1)
Sodium: 134 mmol/L — ABNORMAL LOW (ref 135–145)

## 2018-10-08 LAB — MAGNESIUM: Magnesium: 1.9 mg/dL (ref 1.7–2.4)

## 2018-10-08 LAB — PHOSPHORUS: Phosphorus: 2.6 mg/dL (ref 2.5–4.6)

## 2018-10-08 NOTE — Plan of Care (Signed)
  Problem: Clinical Measurements: Goal: Diagnostic test results will improve Outcome: Progressing   Problem: Nutrition: Goal: Adequate nutrition will be maintained Outcome: Progressing   Problem: Pain Managment: Goal: General experience of comfort will improve Outcome: Progressing   

## 2018-10-08 NOTE — Progress Notes (Addendum)
Patient has a fever of 101.6. PRN tylenol given. Dr. Margaretmary Eddy notified. NO other symptoms from patient. No new orders.

## 2018-10-08 NOTE — Progress Notes (Addendum)
[place] at [place] NAME: [name]    MR#:  [id number]  DATE OF BIRTH:  11-28-1933  SUBJECTIVE:   Patient denies lightheadedness or dizziness.  Not febrile in the past 24 hours  REVIEW OF SYSTEMS:   Review of Systems  Constitutional: Negative for chills, fever and weight loss.  HENT: Negative for ear discharge, ear pain and nosebleeds.   Eyes: Negative for blurred vision, pain and discharge.  Respiratory: Negative for sputum production, shortness of breath, wheezing and stridor.   Cardiovascular: Negative for chest pain, palpitations, orthopnea and PND.  Gastrointestinal: Negative for abdominal pain, diarrhea, melena, nausea and vomiting.  Genitourinary: Negative for frequency and urgency.  Musculoskeletal: Negative for back pain, falls and joint pain.  Neurological: Positive for weakness. Negative for sensory change, speech change and focal weakness.  Psychiatric/Behavioral: Negative for depression and hallucinations. The patient is not nervous/anxious.     DRUG ALLERGIES:  No Known Allergies  VITALS:  Blood pressure (!) 138/115, pulse 86, temperature 98.2 F (36.8 C), temperature source Oral, resp. rate 19, height 6' (1.829 m), weight 60.5 kg, SpO2 98 %.  PHYSICAL EXAMINATION:   Physical Exam  GENERAL:  85 y.o.-year-old patient lying in the bed with no acute distress.  EYES: Pupils equal, round, reactive to light and accommodation. No scleral icterus. Extraocular muscles intact.  HEENT: Head atraumatic, normocephalic. Oropharynx and nasopharynx clear.  + Ecchymoses on his forehead NECK:  Supple, no jugular venous distention. No thyroid enlargement, no tenderness.  LUNGS: Normal breath sounds bilaterally, no wheezing, rales, rhonchi. No use of accessory muscles of respiration.  CARDIOVASCULAR: RRR, S1, S2 normal. No murmurs, rubs, or gallops.  ABDOMEN: Soft, nontender, nondistended. Bowel sounds present. No  organomegaly or mass.  EXTREMITIES: No cyanosis, clubbing or edema b/l.    NEUROLOGIC: Cranial nerves II through XII are intact. No focal Motor or sensory deficits b/l.  + Global weakness. PSYCHIATRIC:  patient is alert and oriented x 3.  SKIN: No obvious rash, lesion, or ulcer.   LABORATORY PANEL:  CBC Recent Labs  Lab 10/08/18 0601  WBC 12.4*  HGB 7.9*  HCT 25.0*  PLT 364    Chemistries  Recent Labs  Lab 10/03/18 0439  10/08/18 0601  NA 135   < > 134*  K 3.6   < > 4.1  CL 101   < > 101  CO2 27   < > 26  GLUCOSE 110*   < > 112*  BUN 7*   < > 11  CREATININE 0.56*   < > 0.57*  CALCIUM 8.3*   < > 8.2*  MG  --    < > 1.9  AST 25  --   --   ALT 24  --   --   ALKPHOS 77  --   --   BILITOT 1.4*  --   --    < > = values in this interval not displayed.   Cardiac Enzymes No results for input(s): TROPONINI in the last 168 hours. RADIOLOGY:  Dg Chest 1 View  Result Date: 10/07/2018 CLINICAL DATA:  85 year old male with history of fever. Lightheadedness. Former smoker. EXAM: CHEST  1 VIEW COMPARISON:  Chest x-ray 10/04/2018. FINDINGS: Extensive skin fold artifact projecting over the lateral right hemithorax. Lung volumes are normal. No consolidative airspace disease. No pleural effusions. No pneumothorax. No pulmonary nodule or mass noted. Pulmonary vasculature and the cardiomediastinal silhouette are within normal limits. Aortic atherosclerosis. Old healed  right-sided rib fractures are again incidentally noted. IMPRESSION: 1. No radiographic evidence of acute cardiopulmonary disease. 2. Aortic atherosclerosis. Electronically Signed   By: Vinnie Langton M.D.   On: 10/07/2018 08:34   ASSESSMENT AND PLAN:   85 year old male with history of hypertension who has had decreased appetite for several weeks who presents after multiple falls today and syncopal episode.  Fever-unclear etiology. Temp 100.52F early a.m. 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 -Seen by GI-patient preferred outpatient colonoscopy -EGD 7/6 with a nonbleeding duodenal ulcer -Hematology recommending ferrous sulfate 325 mg daily  Urinary retention -Continue Foley  Hypertension- BPs soft -Holding home lisinopril  History of prostate cancer -Monitor  gen weakness- PT -SNF   CODE STATUS: full  DVT Prophylaxis: SCDs  TOTAL TIME TAKING CARE OF THIS PATIENT: 35 minutes.  >50% time spent on counselling and coordination of care  Note: This dictation was prepared with Dragon dictation along with smaller phrase technology. Any transcriptional errors that result from this process are unintentional.  Plan of care discussed with the patient's daughter Audie Box M.D on 10/08/2018 at 2:36 PM  Between 7am to 6pm - Pager - [phone]  After 6pm go to [email or web address] - password [email or web address] [facility]  Office   [phone]  CC: Primary care physician; [name], MDPatient ID: [name], male   DOB: [birth year], 85 y.o.   MRN: [id number]

## 2018-10-08 NOTE — Consult Note (Signed)
PHARMACY CONSULT NOTE - FOLLOW UP  Pharmacy Consult for Electrolyte Monitoring and Replacement   Recent Labs: Potassium (mmol/L)  Date Value  10/08/2018 4.1   Magnesium (mg/dL)  Date Value  10/08/2018 1.9   Calcium (mg/dL)  Date Value  10/08/2018 8.2 (L)   Albumin (g/dL)  Date Value  10/03/2018 2.7 (L)   Phosphorus (mg/dL)  Date Value  10/08/2018 2.6   Sodium (mmol/L)  Date Value  10/08/2018 134 (L)  Corrected Ca: 8.84 mg/dL  Scr: 0.56>> 0.69 >>0.77 >> 0.69 >> 0.66  Assessment: Patient admitted with hypokalemia. Pharmacy consulted for electrolyte monitoring and replacement. Patient unable to take oral replacement at this time.  PEG placement on 7/6 and he started tube feeds yesterday. Feeding supplement: osmolite 1.5 @ goal rate of 13ml/hr. Patient is at risk for refeeding.   Patient remains hypophosphatemia.   Goal of Therapy:  Electrolytes WNL  Plan:   No replacement needed at this time.    Will F/U with AM labs and continue to replace electrolytes as needed.   Oswald Hillock, PharmD, BCPS Clinical Pharmacist 10/08/2018 8:38 AM

## 2018-10-08 NOTE — Progress Notes (Signed)
Spoke with daughter [name] over the phone and updated.

## 2018-10-08 NOTE — Plan of Care (Signed)
  Problem: Clinical Measurements: Goal: Respiratory complications will improve Outcome: Progressing   Problem: Nutrition: Goal: Adequate nutrition will be maintained Outcome: Progressing Note: Tube feedings infusing at 55 mL/hr without issue

## 2018-10-08 NOTE — Plan of Care (Signed)
  Problem: Education: Goal: Knowledge of General Education information will improve Description: Including pain rating scale, medication(s)/side effects and non-pharmacologic comfort measures Outcome: Progressing   Problem: Clinical Measurements: Goal: Respiratory complications will improve Outcome: Progressing   Problem: Safety: Goal: Ability to remain free from injury will improve Outcome: Progressing   

## 2018-10-09 LAB — BASIC METABOLIC PANEL
Anion gap: 8 (ref 5–15)
BUN: 12 mg/dL (ref 8–23)
CO2: 25 mmol/L (ref 22–32)
Calcium: 8.3 mg/dL — ABNORMAL LOW (ref 8.9–10.3)
Chloride: 99 mmol/L (ref 98–111)
Creatinine, Ser: 0.61 mg/dL (ref 0.61–1.24)
GFR calc Af Amer: >60 mL/min (ref >60)
GFR calc non Af Amer: >60 mL/min (ref >60)
Glucose, Bld: 123 mg/dL — ABNORMAL HIGH (ref 70–99)
Potassium: 4.2 mmol/L (ref 3.5–5.1)
Sodium: 132 mmol/L — ABNORMAL LOW (ref 135–145)

## 2018-10-09 LAB — GLUCOSE, CAPILLARY
Glucose-Capillary: 108 mg/dL — ABNORMAL HIGH (ref 70–99)
Glucose-Capillary: 108 mg/dL — ABNORMAL HIGH (ref 70–99)
Glucose-Capillary: 105 mg/dL — ABNORMAL HIGH (ref 70–99)

## 2018-10-09 MED ORDER — SODIUM CHLORIDE 0.9 % IV SOLN
2.0000 g | Freq: Three times a day (TID) | INTRAVENOUS | Status: DC
  Administered 2018-10-09 – 2018-10-11 (×7): 2 g via INTRAVENOUS
  Filled 2018-10-09 (×13): qty 2

## 2018-10-09 MED ORDER — VANCOMYCIN HCL 1.25 G IV SOLR
1250.0000 mg | INTRAVENOUS | Status: DC
  Administered 2018-10-10 – 2018-10-11 (×2): 1250 mg via INTRAVENOUS
  Filled 2018-10-09 (×4): qty 1250

## 2018-10-09 MED ORDER — VANCOMYCIN HCL IN DEXTROSE 750-5 MG/150ML-% IV SOLN
750.0000 mg | Freq: Two times a day (BID) | INTRAVENOUS | Status: DC
  Filled 2018-10-09 (×2): qty 150

## 2018-10-09 MED ORDER — VANCOMYCIN HCL 10 G IV SOLR
1500.0000 mg | Freq: Once | INTRAVENOUS | Status: AC
  Administered 2018-10-09: 1500 mg via INTRAVENOUS
  Filled 2018-10-09: qty 1500

## 2018-10-09 NOTE — Progress Notes (Signed)
This RN noted patients PEG tube incision site to have malodorous purulent drainage. Surrounding skin slightly erythremic. MD Marcille Blanco made aware. Orders to culture site placed. Site was cleaned and specimen was obtained. Will continue to monitor.

## 2018-10-09 NOTE — Consult Note (Signed)
Pharmacy Antibiotic Note  [name] is a 85 y.o. male admitted on 09/19/2018 with Wound infection/fevers.  Pharmacy has been consulted for cefepime and vancomycin dosing.  Plan: Cefepime 2 g q8H   Will order vancomycin 1500 mg loading dose x 1 followed by vancomycin 750 mg q12H Predicted AUC 497. Goal AUC 400-550. Plan to obtain levels in 4-5 days. Scr 0.8 (rounded up).   Height: 6' (182.9 cm) Weight: 133 lb 6.4 oz (60.5 kg) IBW/kg (Calculated) : 77.6  Temp (24hrs), Avg:99.3 F (37.4 C), Min:98.2 F (36.8 C), Max:101.6 F (38.7 C)  Recent Labs  Lab 10/03/18 0439 10/03/18 2358 10/05/18 0639 10/06/18 0458 10/07/18 0319 10/07/18 0320 10/08/18 0601 10/09/18 0553  WBC 6.2 13.2* 11.8*  --  11.4*  --  12.4*  --   CREATININE 0.56* 0.69 0.77 0.69  --  0.66 0.57* 0.61    Estimated Creatinine Clearance: 60.9 mL/min (by C-G formula based on SCr of 0.61 mg/dL).    No Known Allergies  Antimicrobials this admission: 7/12 cefepime  >>  7/12 vancomycin >>   Dose adjustments this admission: None  Microbiology results: 7/10 BCx: pending 7/12 abdominal cx: pending  Thank you for allowing pharmacy to be a part of this patient's care.  Oswald Hillock, PharmD, BCPS 10/09/2018 3:19 PM

## 2018-10-09 NOTE — Consult Note (Signed)
Pharmacy Antibiotic Note  [name] is a 85 y.o. male admitted on 09/19/2018 with Wound infection/fevers.  Pharmacy has been consulted for cefepime and vancomycin dosing.  Plan: Cefepime 2 g q8H   Vancomycin 1500 mg loading dose x 1. Start Vancomycin 1250 mg IV Q 24 hrs. Goal AUC 400-550. Expected AUC: 522 SCr used: 0.8 TBW used due to IBW>TBW  Plan to obtain levels in 4-5 days. Scr 0.8 (rounded up).   Height: 6' (182.9 cm) Weight: 133 lb 6.4 oz (60.5 kg) IBW/kg (Calculated) : 77.6  Temp (24hrs), Avg:98.6 F (37 C), Min:98.2 F (36.8 C), Max:99.1 F (37.3 C)  Recent Labs  Lab 10/03/18 0439 10/03/18 2358 10/05/18 0639 10/06/18 0458 10/07/18 0319 10/07/18 0320 10/08/18 0601 10/09/18 0553  WBC 6.2 13.2* 11.8*  --  11.4*  --  12.4*  --   CREATININE 0.56* 0.69 0.77 0.69  --  0.66 0.57* 0.61    Estimated Creatinine Clearance: 60.9 mL/min (by C-G formula based on SCr of 0.61 mg/dL).    No Known Allergies  Antimicrobials this admission: 7/12 cefepime  >>  7/12 vancomycin >>   Dose adjustments this admission: None  Microbiology results: 7/10 BCx: pending 7/12 abdominal cx: pending  Thank you for allowing pharmacy to be a part of this patient's care.  Pernell Dupre, PharmD, BCPS 10/09/2018 11:09 PM

## 2018-10-09 NOTE — Progress Notes (Addendum)
[place] at [place] NAME: [name]    MR#:  [id number]  DATE OF BIRTH:  10-14-1933  SUBJECTIVE:   Patient spiked high-grade temperature yesterday evening.  PEG site is tender and some purulent discharge noticed  REVIEW OF SYSTEMS:   Review of Systems  Constitutional: Negative for chills, fever and weight loss.  HENT: Negative for ear discharge, ear pain and nosebleeds.   Eyes: Negative for blurred vision, pain and discharge.  Respiratory: Negative for sputum production, shortness of breath, wheezing and stridor.   Cardiovascular: Negative for chest pain, palpitations, orthopnea and PND.  Gastrointestinal: Negative for abdominal pain, diarrhea, melena, nausea and vomiting.  Genitourinary: Negative for frequency and urgency.  Musculoskeletal: Negative for back pain, falls and joint pain.  Neurological: Positive for weakness. Negative for sensory change, speech change and focal weakness.  Psychiatric/Behavioral: Negative for depression and hallucinations. The patient is not nervous/anxious.     DRUG ALLERGIES:  No Known Allergies  VITALS:  Blood pressure 137/87, pulse 87, temperature 98.6 F (37 C), temperature source Oral, resp. rate (!) 22, height 6' (1.829 m), weight 60.5 kg, SpO2 98 %.  PHYSICAL EXAMINATION:   Physical Exam  GENERAL:  85 y.o.-year-old patient lying in the bed with no acute distress.  EYES: Pupils equal, round, reactive to light and accommodation. No scleral icterus. Extraocular muscles intact.  HEENT: Head atraumatic, normocephalic. Oropharynx and nasopharynx clear.  + Ecchymoses on his forehead NECK:  Supple, no jugular venous distention. No thyroid enlargement, no tenderness.  LUNGS: Normal breath sounds bilaterally, no wheezing, rales, rhonchi. No use of accessory muscles of respiration.  CARDIOVASCULAR: RRR, S1, S2 normal. No murmurs, rubs, or gallops.  ABDOMEN: Soft, nontender,  nondistended. Bowel sounds present. No organomegaly or mass.  EXTREMITIES: No cyanosis, clubbing or edema b/l.    NEUROLOGIC: Cranial nerves II through XII are intact. No focal Motor or sensory deficits b/l.  + Global weakness. PSYCHIATRIC:  patient is alert and oriented x 3.  SKIN: No obvious rash, lesion, or ulcer.   LABORATORY PANEL:  CBC Recent Labs  Lab 10/08/18 0601  WBC 12.4*  HGB 7.9*  HCT 25.0*  PLT 364    Chemistries  Recent Labs  Lab 10/03/18 0439  10/08/18 0601 10/09/18 0553  NA 135   < > 134* 132*  K 3.6   < > 4.1 4.2  CL 101   < > 101 99  CO2 27   < > 26 25  GLUCOSE 110*   < > 112* 123*  BUN 7*   < > 11 12  CREATININE 0.56*   < > 0.57* 0.61  CALCIUM 8.3*   < > 8.2* 8.3*  MG  --    < > 1.9  --   AST 25  --   --   --   ALT 24  --   --   --   ALKPHOS 77  --   --   --   BILITOT 1.4*  --   --   --    < > = values in this interval not displayed.   Cardiac Enzymes No results for input(s): TROPONINI in the last 168 hours. RADIOLOGY:  No results found. ASSESSMENT AND PLAN:   85 year old male with history of hypertension who has had decreased appetite for several weeks who presents after multiple falls today and syncopal episode.  Fever- possibly from infected PEG site -Febrile yesterday evening.  Wound culture done  from the PEG site -Blood cultures no growth in 24 hours, urinalysis negative -WBC 11.4-12.4 -Chest x-ray-no acute findings -Procalcitonin 0.58 -COVID neg 7/3  -Started empiric antibiotics cefepime and vancomycin.  ID consult placed  Severe persistent dysphagia -Patient had MBS done -s/p PEG placement 7/6 -Continue tube feeds -Seen by palliative care, who recommended outpatient palliative f/u  Left subdural hematoma s/p fall at home with left frontal cortex CVA -Repeat CT head 6/23 showed a stable hematoma -Holding aspirin and DVT prophylaxis -Patient has been evaluated by neurosurgery -Repeat CT head today  Orthostatic  hypotension-resolved with IV fluids -Continue IV fluids for today -Recheck orthostatic vitals in the morning -Recent ECHO with normal EF -Repeat CT head was stable -Holding home lisinopril  Tachycardia- resolved. 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 -Seen by GI-patient preferred outpatient colonoscopy -EGD 7/6 with a nonbleeding duodenal ulcer -Hematology recommending ferrous sulfate 325 mg daily  Urinary retention -Continue Foley  Hypertension- BPs soft -Holding home lisinopril  History of prostate cancer -Monitor  gen weakness- PT -SNF   CODE STATUS: full  DVT Prophylaxis: SCDs  TOTAL TIME TAKING CARE OF THIS PATIENT: 35 minutes.  >50% time spent on counselling and coordination of care  Note: This dictation was prepared with Dragon dictation along with smaller phrase technology. Any transcriptional errors that result from this process are unintentional.  Plan of care discussed with the patient's daughter [name] on 10/09/2018 at 3:16 PM  Between 7am to 6pm - Pager - [phone]  After 6pm go to [email or web address] - password [email or web address] [facility]  Office  [phone]  CC: Primary care physician; [name], MDPatient ID: [name], male   DOB: Mar 17, 1934, 85 y.o.   MRN: [id number]

## 2018-10-09 NOTE — Consult Note (Signed)
PHARMACY CONSULT NOTE - FOLLOW UP  Pharmacy Consult for Electrolyte Monitoring and Replacement   Recent Labs: Potassium (mmol/L)  Date Value  10/09/2018 4.2   Magnesium (mg/dL)  Date Value  10/08/2018 1.9   Calcium (mg/dL)  Date Value  10/09/2018 8.3 (L)   Albumin (g/dL)  Date Value  10/03/2018 2.7 (L)   Phosphorus (mg/dL)  Date Value  10/08/2018 2.6   Sodium (mmol/L)  Date Value  10/09/2018 132 (L)  Corrected Ca: 9.3 mg/dL  Assessment: Patient admitted with hypokalemia. Pharmacy consulted for electrolyte monitoring and replacement. PEG placement on 7/6 and he started tube feeds yesterday. Feeding supplement: osmolite 1.5 @ goal rate of 79ml/hr. Patient is at risk for refeeding.    Goal of Therapy:  Electrolytes WNL  Plan:   No replacement needed at this time.    Will F/U with AM labs and continue to replace electrolytes as needed.   Oswald Hillock, PharmD, BCPS Clinical Pharmacist 10/09/2018 7:46 AM

## 2018-10-09 NOTE — Progress Notes (Signed)
PT Cancellation Note  Patient Details Name: [name] MRN: [id number] DOB: [birth year]   Cancelled Treatment:    Reason Eval/Treat Not Completed: Other (comment)   Offered session this am.  Flatly refused gait but stated he may do ex later today.  Will reschedule as time allows.   Chesley Noon 10/09/2018, 1:46 PM

## 2018-10-10 ENCOUNTER — Telehealth: Payer: Self-pay | Admitting: Gastroenterology

## 2018-10-10 DIAGNOSIS — S065X9A Traumatic subdural hemorrhage with loss of consciousness of unspecified duration, initial encounter: Principal | ICD-10-CM

## 2018-10-10 DIAGNOSIS — Z931 Gastrostomy status: Secondary | ICD-10-CM

## 2018-10-10 DIAGNOSIS — Z9889 Other specified postprocedural states: Secondary | ICD-10-CM

## 2018-10-10 DIAGNOSIS — Z960 Presence of urogenital implants: Secondary | ICD-10-CM

## 2018-10-10 LAB — CBC WITH DIFFERENTIAL/PLATELET
Abs Immature Granulocytes: 0.48 10*3/uL — ABNORMAL HIGH (ref 0.00–0.07)
Basophils Absolute: 0.1 10*3/uL (ref 0.0–0.1)
Basophils Relative: 1 %
Eosinophils Absolute: 0.2 10*3/uL (ref 0.0–0.5)
Eosinophils Relative: 1 %
HCT: 25.6 % — ABNORMAL LOW (ref 39.0–52.0)
Hemoglobin: 8.1 g/dL — ABNORMAL LOW (ref 13.0–17.0)
Immature Granulocytes: 3 %
Lymphocytes Relative: 8 %
Lymphs Abs: 1.2 10*3/uL (ref 0.7–4.0)
MCH: 28.1 pg (ref 26.0–34.0)
MCHC: 31.6 g/dL (ref 30.0–36.0)
MCV: 88.9 fL (ref 80.0–100.0)
Monocytes Absolute: 1.2 10*3/uL — ABNORMAL HIGH (ref 0.1–1.0)
Monocytes Relative: 8 %
Neutro Abs: 12.4 10*3/uL — ABNORMAL HIGH (ref 1.7–7.7)
Neutrophils Relative %: 79 %
Platelets: 405 10*3/uL — ABNORMAL HIGH (ref 150–400)
RBC: 2.88 MIL/uL — ABNORMAL LOW (ref 4.22–5.81)
RDW: 13.8 % (ref 11.5–15.5)
WBC: 15.6 10*3/uL — ABNORMAL HIGH (ref 4.0–10.5)
nRBC: 0 % (ref 0.0–0.2)

## 2018-10-10 LAB — BASIC METABOLIC PANEL
Anion gap: 5 (ref 5–15)
BUN: 13 mg/dL (ref 8–23)
CO2: 26 mmol/L (ref 22–32)
Calcium: 8.2 mg/dL — ABNORMAL LOW (ref 8.9–10.3)
Chloride: 102 mmol/L (ref 98–111)
Creatinine, Ser: 0.6 mg/dL — ABNORMAL LOW (ref 0.61–1.24)
GFR calc Af Amer: >60 mL/min (ref >60)
GFR calc non Af Amer: >60 mL/min (ref >60)
Glucose, Bld: 131 mg/dL — ABNORMAL HIGH (ref 70–99)
Potassium: 4.1 mmol/L (ref 3.5–5.1)
Sodium: 133 mmol/L — ABNORMAL LOW (ref 135–145)

## 2018-10-10 LAB — GLUCOSE, CAPILLARY
Glucose-Capillary: 109 mg/dL — ABNORMAL HIGH (ref 70–99)
Glucose-Capillary: 121 mg/dL — ABNORMAL HIGH (ref 70–99)
Glucose-Capillary: 121 mg/dL — ABNORMAL HIGH (ref 70–99)
Glucose-Capillary: 128 mg/dL — ABNORMAL HIGH (ref 70–99)

## 2018-10-10 LAB — MAGNESIUM: Magnesium: 1.9 mg/dL (ref 1.7–2.4)

## 2018-10-10 LAB — PHOSPHORUS: Phosphorus: 3 mg/dL (ref 2.5–4.6)

## 2018-10-10 MED ORDER — SODIUM CHLORIDE 0.9 % IV SOLN: INTRAVENOUS | Status: DC | PRN

## 2018-10-10 MED ORDER — SODIUM CHLORIDE 0.9 % IV SOLN
INTRAVENOUS | Status: DC | PRN
  Administered 2018-10-10 – 2018-10-11 (×3): 1000 mL via INTRAVENOUS

## 2018-10-10 NOTE — TOC Progression Note (Signed)
Transition of Care [facility]) - Progression Note    Patient Details  Name: [name] MRN: [id number] Date of Birth: Sep 11, 1933  [facility] CM/SW Contact  Elza Rafter, RN Phone Number: 10/10/2018, 2:39 PM  Clinical Narrative:  Spoke with daughter [name] and updated her that we are still waiting on insurance authorization.  The plan is once medically ready patient will discharge to [facility].  Possible PEG tube infection; started on antibiotics.       Expected Discharge Plan: Skilled Nursing Facility Barriers to Discharge: Continued Medical Work up  Expected Discharge Plan and Services Expected Discharge Plan: [place]   Discharge Planning Services: CM Consult                                           Social Determinants of Health (SDOH) Interventions    Readmission Risk Interventions Readmission Risk Prevention Plan 09/21/2018  Post Dischage Appt Complete  Medication Screening Complete  Transportation Screening Complete  Some recent data might be hidden

## 2018-10-10 NOTE — Plan of Care (Signed)
  Problem: Clinical Measurements: Goal: Respiratory complications will improve Outcome: Progressing Note: On room air    Problem: Nutrition: Goal: Adequate nutrition will be maintained Outcome: Progressing Note: Continues tube feeds infusing   Problem: Coping: Goal: Level of anxiety will decrease Outcome: Progressing   Problem: Pain Managment: Goal: General experience of comfort will improve Outcome: Progressing Note: No complaints of pain    Problem: Safety: Goal: Ability to remain free from injury will improve Outcome: Progressing   Problem: Skin Integrity: Goal: Risk for impaired skin integrity will decrease Outcome: Progressing

## 2018-10-10 NOTE — Progress Notes (Addendum)
Updated patient's daughter [name] over the phone this morning.   Update 1704: Spoke with [name] again over the phone, she requested for MD to call. Dr. Margaretmary Eddy notified.

## 2018-10-10 NOTE — Plan of Care (Signed)
  Problem: Education: Goal: Knowledge of General Education information will improve Description Including pain rating scale, medication(s)/side effects and non-pharmacologic comfort measures Outcome: Progressing   Problem: Health Behavior/Discharge Planning: Goal: Ability to manage health-related needs will improve Outcome: Progressing   

## 2018-10-10 NOTE — Progress Notes (Signed)
Nutrition Follow Up Note   DOCUMENTATION CODES:   Underweight  INTERVENTION:   Osmolite 1.5 @ goal rate of 52m/hr  Free water flushes 756mq 4 hours.  Regimen provides 1980kcal/day, 83g/day protein, 145697may free water   Vitamin C 250m47mD via tube   NUTRITION DIAGNOSIS:   Inadequate oral intake related to acute illness as evidenced by meal completion < 50%. -pt with tube feeds  GOAL:   Patient will meet greater than or equal to 90% of their needs  -met with tube feeds  MONITOR:   Labs, Weight trends, Skin, I & O's, Diet advancement, tube feeds  ASSESSMENT:   84 y63r old male with history of hypertension who has had decreased appetite for several weeks who presents after multiple falls today and syncopal episode.  RD working remotely.  Pt s/p PEG placement 7/6. Pt tolerating tube feeds well. PEG tube site with purulent discharge and pt febrile. Cultures pending. Refeed labs stable. No new weight since 6/25; will request daily weights as pt receiving IVF, free water and tube feeds. Pt will need SNF at discharge.    Medications reviewed and include: ferrous sulfate, MVI, protonix, vitamin  NaCl _0 /hr, cefepime, vancomycin   Labs reviewed: Na 133(L), K 4.1 wnl, Mg 1.9 wnl, P 3.0 wnl Wbc- 15.6(H), Hgb 8.1(L), Hct 25.6(L) cbgs- 108, 108, 105, 128, 121 x 24 hrs  Diet Order:   Diet Order            Diet NPO time specified  Diet effective now             EDUCATION NEEDS:   No education needs have been identified at this time  Skin:  Skin Assessment: Reviewed RN Assessment  Last BM:  7/10  Height:   Ht Readings from Last 1 Encounters:  09/19/18 6' (1.829 m)    Weight:   Wt Readings from Last 1 Encounters:  09/22/18 60.5 kg    Ideal Body Weight:  80.9 kg  BMI:  Body mass index is 18.09 kg/m.  Estimated Nutritional Needs:   Kcal:  1700-2000kcal/day  Protein:  85-100g/day  Fluid:  >1.5L/day  CaseKoleen Distance RD, LDN Pager #-  [phone]#- [phone] Hours Pager: [phone]

## 2018-10-10 NOTE — Consult Note (Signed)
PHARMACY CONSULT NOTE - FOLLOW UP  Pharmacy Consult for Electrolyte Monitoring and Replacement   Recent Labs: Potassium (mmol/L)  Date Value  10/10/2018 4.1   Magnesium (mg/dL)  Date Value  10/10/2018 1.9   Calcium (mg/dL)  Date Value  10/10/2018 8.2 (L)   Albumin (g/dL)  Date Value  10/03/2018 2.7 (L)   Phosphorus (mg/dL)  Date Value  10/10/2018 3.0   Sodium (mmol/L)  Date Value  10/10/2018 133 (L)  Corrected Ca: 9.3 mg/dL  Assessment: Patient admitted with hypokalemia. Pharmacy consulted for electrolyte monitoring and replacement. PEG placement on 7/6 and he started tube feeds yesterday. Feeding supplement: osmolite 1.5 @ goal rate of 75ml/hr. Patient is at risk for refeeding.    Goal of Therapy:  Electrolytes WNL  Plan:   No replacement needed at this time.    With labs every other day and continue to replace electrolytes as needed.   Oswald Hillock, PharmD, BCPS Clinical Pharmacist 10/10/2018 7:25 AM

## 2018-10-10 NOTE — Telephone Encounter (Signed)
Left vm for pt to call office and schedule f/u with Dr. Vicente Males

## 2018-10-10 NOTE — Progress Notes (Signed)
Date of Admission:  09/19/2018    I had seen the patient earlier in his stay.  Patient was admitted with syncope and fall and trauma and he had left subdural hematoma and also left facial ecchymosis.  He had fever with pancytopenia.  He was diagnosed with Ehrlichia this was confirmed by PCR.  His fever resolved promptly after starting doxycycline.  He completed the full course and finished on 09/29/2018 when I signed off his case.  His leukopenia and thrombocytopenia had resolved.  Also the LFTs high LDH high ferritin all improved. Patient had anemia and was seen by oncologist as well multiple myeloma screen was negative.There was a question of plasmacytoid cells with possibility of a bone marrow biopsy in the future as per heme-onc. He also had a gram-positive rod 1 in 4 blood culture it was bacillus and a contaminant and there was no treatment given for that. Patient during the stay was also diagnosed with difficulty in swallowing especially by swallow test studies.  MRI brain without contrast done on 09/21/2018 showed delayed intracranial hemorrhage with interval development of acute left subdural hematoma and prominent appearance pituitary gland raising the possibility of an underlying pituitary lesion.  He also had age-related cerebral atrophy with mild chronic microvascular ischemic diseases.  And a punctate 4 mm acute ischemic cortical infarct involving the underlying left frontal operculum was also seen.  He  underwent PEG placement on 10/03/2018.  He started having fevers again from 10/05/2018.  T-max of 101.6.  It was thought by the primary team that it was due to the PEG site as it was tender.   His WBC started to go up from 10/03/2018 and Today it is 15.6.  Blood cultures were sent on 10/07/18 and peg site culture on 10/09/18 He has been started on vancomycin and cefepime for the primary team.  And I am asked to see the patient for the same.  Subjective: Lying in bed, says he is feeling okay.  Not a  good historian.  Very frail.  Medications:  . sodium chloride   Intravenous Once  . ferrous sulfate  325 mg Oral Daily  . free water  75 mL Per Tube Q4H  . metoprolol tartrate  25 mg Per Tube BID  . multivitamin with minerals  1 tablet Oral Daily  . neomycin-bacitracin-polymyxin   Topical Daily  . pantoprazole (PROTONIX) IV  40 mg Intravenous Q12H  . vitamin C  250 mg Per Tube BID    Objective: Vital signs in last 24 hours: Temp:  [98.3 F (36.8 C)-99.1 F (37.3 C)] 98.3 F (36.8 C) (07/13 0730) Pulse Rate:  [78-83] 83 (07/13 0730) Resp:  [16-20] 20 (07/13 0730) BP: (131-136)/(79-81) 131/80 (07/13 0730) SpO2:  [97 %-100 %] 97 % (07/13 0730)  PHYSICAL EXAM:  General: Awake, alert, little confused: Frail Head: Normocephalic, hematoma and ecchymosis on the left side of his scalp and face is resolving.  Eyes: Conjunctivae clear, anicteric sclerae. Pupils are equal ENT Nares normal. No drainage or sinus tenderness. Oral mucosa dry Neck: Supple,  Back: No CVA tenderness. Lungs: Bilateral air entry. Heart: S1-S2 abdomen: Soft, non-tender,not distended. Bowel sounds normal. No masses    Extremities: Bruising Skin: No rashes or lesions. Or bruising Lymph: Cervical, supraclavicular normal. Neurologic: Grossly non-focal  Lab Results Recent Labs    10/08/18 0601 10/09/18 0553 10/10/18 0508  WBC 12.4*  --  15.6*  HGB 7.9*  --  8.1*  HCT 25.0*  --  25.6*  NA  134* 132* 133*  K 4.1 4.2 4.1  CL 101 99 102  CO2 _0 BUN _1 CREATININE 0.57* 0.61 0.60*   Liver Panel No results for input(s): PROT, ALBUMIN, AST, ALT, ALKPHOS, BILITOT, BILIDIR, IBILI in the last 72 hours. Sedimentation Rate No results for input(s): ESRSEDRATE in the last 72 hours. C-Reactive Protein No results for input(s): CRP in the last 72 hours.  Microbiology:  Studies/Results: Chest x-ray from 10/07/2018 no infiltrate.   Assessment/Plan: 85 year old male has been in the hospital  since  09/19/2018 when he presented with fall and syncope and had a hematoma on his scalp and face on the left side.  He also had high fever with pancytopenia.and  abnormal LFTs.  He had positive Ehrlichia PCR and was treated with doxycycline for 9 days with resolution of symptoms..  New  fever: Hospital-acquired.  He has a Foley catheter, PEG.  The PEG site has minimal erythema but does have some purulent discharge which could be the tube feeds versus infection. DD UTI Aspiration pneumonia PEG site infection Unknown cause. The chest x-ray shows no aspiration pneumonia.  The urinary analysis shows no WBCs.  As the blood culture is got no growth we may stop both antibiotics if the PEG site culture comes back negative. The other differential diagnosis for the fever is possible MDS.  And also a history of prostate cancer with a recent PSA elevated at 12.82. We may have to explore the about 2 if the fever continues.  Anemia: Has been seen by heme-onc.  Iron deficiency suspected so is MDS.  He had received transfusion during that admission.  Discussed the management with hospitalist.

## 2018-10-10 NOTE — Telephone Encounter (Signed)
-----   Message from Virgel Manifold, MD sent at 10/05/2018 12:15 PM EDT -----  Please set up clinic appointment with Dr. Vicente Males for hospital follow up in 4-6 weeks

## 2018-10-10 NOTE — Progress Notes (Signed)
PT Cancellation Note  Patient Details Name: [name] MRN: [id number] DOB: [birth year]   Cancelled Treatment:    Reason Eval/Treat Not Completed: Patient declined, no reason specified. Treatment attempted, pt adamantly refuses despite gentle encouragement. Re attempt at a later date.    Larae Grooms, PTA 10/10/2018, 2:39 PM

## 2018-10-10 NOTE — Progress Notes (Signed)
Daily Progress Note   Patient Name: [name]       Date: 10/10/2018 DOB: 1933/12/15  Age: 85 y.o. MRN#: [id number] Attending Physician: Nicholes Mango, MD Primary Care Physician: [name], MD Admit Date: 09/19/2018  Reason for Consultation/Follow-up: Psychosocial/spiritual support  Subjective: Patient is resting in bed watching t.v. He states he is ready to discharge from the hospital to rehab and wants to go tomorrow. He states he does not understand how he got an infection at his PEG tube site.   Length of Stay: 21  Current Medications: Scheduled Meds:  . sodium chloride   Intravenous Once  . ferrous sulfate  325 mg Oral Daily  . free water  75 mL Per Tube Q4H  . metoprolol tartrate  25 mg Per Tube BID  . neomycin-bacitracin-polymyxin   Topical Daily  . pantoprazole (PROTONIX) IV  40 mg Intravenous Q12H  . vitamin C  250 mg Per Tube BID    Continuous Infusions: . sodium chloride 10 mL/hr at 10/10/18 1426  . ceFEPime (MAXIPIME) IV 2 g (10/10/18 1427)  . feeding supplement (OSMOLITE 1.5 CAL) 1,000 mL (10/09/18 1836)  . vancomycin      PRN Meds: sodium chloride, acetaminophen, guaiFENesin-dextromethorphan, HYDROcodone-acetaminophen, morphine injection, ondansetron **OR** ondansetron (ZOFRAN) IV, polyethylene glycol  Physical Exam Pulmonary:     Effort: Pulmonary effort is normal.  Abdominal:     Comments: PEG site looks healthy with sloughing at insertion site.   Neurological:     Mental Status: He is alert.             Vital Signs: BP 131/80 (BP Location: Left Arm)   Pulse 83   Temp 98.3 F (36.8 C) (Oral)   Resp 20   Ht 6' (1.829 m)   Wt 60.5 kg   SpO2 97%   BMI 18.09 kg/m  SpO2: SpO2: 97 % O2 Device: O2 Device: Room Air O2 Flow Rate: O2 Flow  Rate (L/min): 2 L/min  Intake/output summary:   Intake/Output Summary (Last 24 hours) at 10/10/2018 1525 Last data filed at 10/10/2018 1425 Gross per 24 hour  Intake 7259.79 ml  Output 3975 ml  Net 3284.79 ml   LBM: Last BM Date: (pt does not recall. states he is incontinent) Baseline Weight: Weight: 61.2 kg Most recent weight: Weight: 60.5 kg  Palliative Assessment/Data:      Patient Active Problem List   Diagnosis Date Noted  . Coagulopathy ([place])   . Pressure injury of skin 10/03/2018  . Dysphagia   . Anemia   . Thrombocytopenia ([place])   . Blood in stool   . Elevated transaminase level   . Syncope 09/19/2018    Palliative Care Assessment & Plan   Recommendations/Plan:  Recommend palliative at D/C.    Code Status:    Code Status Orders  (From admission, onward)         Start     Ordered   09/30/18 1344  Do not attempt resuscitation (DNR)  Continuous    Question Answer Comment  In the event of cardiac or respiratory ARREST Do not call a "code blue"   In the event of cardiac or respiratory ARREST Do not perform Intubation, CPR, defibrillation or ACLS   In the event of cardiac or respiratory ARREST Use medication by any route, position, wound care, and other measures to relive pain and suffering. May use oxygen, suction and manual treatment of airway obstruction as needed for comfort.      09/30/18 1343        Code Status History    Date Active Date Inactive Code Status Order ID Comments User Context   09/19/2018 2317 09/30/2018 1343 Full Code [id number]  Bettey Costa, MD Inpatient   09/19/2018 1812 09/19/2018 2316 DNR [id number]  Bettey Costa, MD ED   Advance Care Planning Activity    Advance Directive Documentation     Most Recent Value  Type of Advance Directive  Healthcare Power of Attorney  Pre-existing out of facility DNR order (yellow form or pink MOST form)  -  "MOST" Form in Place?  -       Prognosis:   Unable to determine  Discharge  Planning:  SNF with palliative    Thank you for allowing the Palliative Medicine Team to assist in the care of this patient.   Total Time 15 min Prolonged Time Billed  no      Greater than 50%  of this time was spent counseling and coordinating care related to the above assessment and plan.  Asencion Gowda, NP  Please contact Palliative Medicine Team phone at [phone] for questions and concerns.

## 2018-10-10 NOTE — Care Management Important Message (Signed)
Important Message  Patient Details  Name: WAKE CONLEE MRN: [id number] Date of Birth: 01-02-1934   Medicare Important Message Given:  Yes  Reviewed with patient and with daughter, Sandi Mariscal, at [phone].  Daughter requested copy sent to her via email. Copy of Medicare IM sent securely to email provided: [email or web address].   Dannette Barbara 10/10/2018, 12:44 PM

## 2018-10-10 NOTE — Progress Notes (Signed)
[name] at [place] NAME: [name]    MR#:  [id number]  DATE OF BIRTH:  [birth year]  SUBJECTIVE:   Patient is afebrile the past 24 hours.  PEG site is tender and some purulent discharge noticed  REVIEW OF SYSTEMS:   Review of Systems  Constitutional: Negative for chills, fever and weight loss.  HENT: Negative for ear discharge, ear pain and nosebleeds.   Eyes: Negative for blurred vision, pain and discharge.  Respiratory: Negative for sputum production, shortness of breath, wheezing and stridor.   Cardiovascular: Negative for chest pain, palpitations, orthopnea and PND.  Gastrointestinal: Negative for abdominal pain, diarrhea, melena, nausea and vomiting.  Genitourinary: Negative for frequency and urgency.  Musculoskeletal: Negative for back pain, falls and joint pain.  Neurological: Positive for weakness. Negative for sensory change, speech change and focal weakness.  Psychiatric/Behavioral: Negative for depression and hallucinations. The patient is not nervous/anxious.     DRUG ALLERGIES:  No Known Allergies  VITALS:  Blood pressure 131/80, pulse 83, temperature 98.3 F (36.8 C), temperature source Oral, resp. rate 20, height 6' (1.829 m), weight 60.5 kg, SpO2 97 %.  PHYSICAL EXAMINATION:   Physical Exam  GENERAL:  85 y.o.-year-old patient lying in the bed with no acute distress.  EYES: Pupils equal, round, reactive to light and accommodation. No scleral icterus. Extraocular muscles intact.  HEENT: Head atraumatic, normocephalic. Oropharynx and nasopharynx clear.  + Ecchymoses on his forehead NECK:  Supple, no jugular venous distention. No thyroid enlargement, no tenderness.  LUNGS: Normal breath sounds bilaterally, no wheezing, rales, rhonchi. No use of accessory muscles of respiration.  CARDIOVASCULAR: RRR, S1, S2 normal. No murmurs, rubs, or gallops.  ABDOMEN: Soft, nontender, nondistended. Bowel sounds  present. No organomegaly or mass.  EXTREMITIES: No cyanosis, clubbing or edema b/l.    NEUROLOGIC: Cranial nerves II through XII are intact. No focal Motor or sensory deficits b/l.  + Global weakness. PSYCHIATRIC:  patient is alert and oriented x 3.  SKIN: No obvious rash, lesion, or ulcer.   LABORATORY PANEL:  CBC Recent Labs  Lab 10/10/18 0508  WBC 15.6*  HGB 8.1*  HCT 25.6*  PLT 405*    Chemistries  Recent Labs  Lab 10/10/18 0508  NA 133*  K 4.1  CL 102  CO2 26  GLUCOSE 131*  BUN 13  CREATININE 0.60*  CALCIUM 8.2*  MG 1.9   Cardiac Enzymes No results for input(s): TROPONINI in the last 168 hours. RADIOLOGY:  No results found. ASSESSMENT AND PLAN:   85 year old male with history of hypertension who has had decreased appetite for several weeks who presents after multiple falls today and syncopal episode.  Fever- possibly from infected PEG site -  Wound culture done from the PEG site-revealing abundant Staphylococcus aureus -Blood cultures no growth in 24 hours, urinalysis negative -WBC 11.4-12.4-15.6 -Chest x-ray-no acute findings -Procalcitonin 0.58 -COVID neg 7/3  -Started empiric antibiotics cefepime and vancomycin.  ID consult placed.  Discussed with Dr. 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 -Seen by GI-patient preferred outpatient colonoscopy -EGD 7/6 with a nonbleeding duodenal ulcer -Hematology recommending ferrous sulfate 325 mg daily  Urinary retention -Continue Foley  Hypertension- BPs soft -Holding home lisinopril  History of prostate cancer -Monitor  gen weakness- PT -SNF   CODE STATUS: full  DVT Prophylaxis: SCDs  TOTAL TIME TAKING CARE OF THIS PATIENT: 35 minutes.  >50% time spent on counselling and coordination of care  Note: This dictation was prepared with Dragon dictation along with smaller phrase technology. Any transcriptional errors that result from this process are unintentional.  Plan of care discussed with the patient's daughter Tera Partridge Ahniyah Giancola M.D on 10/10/2018 at 3:50 PM  Between 7am to 6pm - Pager - [phone]  After 6pm go to [email or web address] - password [email or web address] [facility]  Office  [phone]  CC: Primary care physician; [name], MDPatient ID: [name], male   DOB: 08-30-1933, 85 y.o.   MRN: [id number]

## 2018-10-11 DIAGNOSIS — R972 Elevated prostate specific antigen [PSA]: Secondary | ICD-10-CM

## 2018-10-11 LAB — RETIC PANEL
Immature Retic Fract: 21.7 % — ABNORMAL HIGH (ref 2.3–15.9)
RBC.: 3.15 MIL/uL — ABNORMAL LOW (ref 4.22–5.81)
Retic Count, Absolute: 71 10*3/uL (ref 19.0–186.0)
Retic Ct Pct: 2.3 % (ref 0.4–3.1)
Reticulocyte Hemoglobin: 23.6 pg — ABNORMAL LOW (ref >27.9)

## 2018-10-11 LAB — GLUCOSE, CAPILLARY
Glucose-Capillary: 143 mg/dL — ABNORMAL HIGH (ref 70–99)
Glucose-Capillary: 116 mg/dL — ABNORMAL HIGH (ref 70–99)
Glucose-Capillary: 126 mg/dL — ABNORMAL HIGH (ref 70–99)

## 2018-10-11 MED ORDER — SODIUM CHLORIDE 0.9 % IV SOLN
200.0000 mg | Freq: Once | INTRAVENOUS | Status: AC
  Administered 2018-10-11: 200 mg via INTRAVENOUS
  Filled 2018-10-11: qty 10

## 2018-10-11 MED ORDER — DIPHENHYDRAMINE HCL 25 MG PO CAPS
50.0000 mg | ORAL_CAPSULE | Freq: Once | ORAL | Status: DC | PRN
  Filled 2018-10-11: qty 2

## 2018-10-11 MED ORDER — DIPHENHYDRAMINE HCL 12.5 MG/5ML PO ELIX
50.0000 mg | ORAL_SOLUTION | Freq: Once | ORAL | Status: AC | PRN
  Administered 2018-10-11: 14:00:00 50 mg
  Filled 2018-10-11: qty 20

## 2018-10-11 NOTE — Plan of Care (Signed)
Foley in place, draining well, unknown when last BM was. Receiving IV abx Problem: Clinical Measurements: Goal: Respiratory complications will improve Outcome: Progressing   Problem: Nutrition: Goal: Adequate nutrition will be maintained Outcome: Progressing Note: Continuous tube feeds in place with free water flushes q4hrs, tolerating well   Problem: Pain Managment: Goal: General experience of comfort will improve Outcome: Progressing Note: No complaints of pain   Problem: Safety: Goal: Ability to remain free from injury will improve Outcome: Progressing   Problem: Coping: Goal: Level of anxiety will decrease Outcome: Completed/Met

## 2018-10-11 NOTE — Consult Note (Signed)
PHARMACY CONSULT NOTE - FOLLOW UP  Pharmacy Consult for Electrolyte Monitoring and Replacement   Recent Labs: Potassium (mmol/L)  Date Value  10/10/2018 4.1   Magnesium (mg/dL)  Date Value  10/10/2018 1.9   Calcium (mg/dL)  Date Value  10/10/2018 8.2 (L)   Albumin (g/dL)  Date Value  10/03/2018 2.7 (L)   Phosphorus (mg/dL)  Date Value  10/10/2018 3.0   Sodium (mmol/L)  Date Value  10/10/2018 133 (L)  Corrected Ca: 9.3 mg/dL  Assessment: Patient admitted with hypokalemia. Pharmacy consulted for electrolyte monitoring and replacement. PEG placement on 7/6 and he started tube feeds yesterday. Feeding supplement: osmolite 1.5 @ goal rate of 1ml/hr. Patient is at risk for refeeding.    Goal of Therapy:  Electrolytes WNL  Plan:   No replacement needed at this time.   With labs every other day and continue to replace electrolytes as needed.   Will order labs for tomorrow AM  Rocky Morel, PharmD, BCPS Clinical Pharmacist 10/11/2018 7:50 AM

## 2018-10-11 NOTE — Progress Notes (Signed)
[name] at [place] NAME: [name]    MR#:  [id number]  DATE OF BIRTH:  [birth year]  SUBJECTIVE:   Patient is afebrile the past 24 hours.  PEG site is tender and some purulent discharge noticed  REVIEW OF SYSTEMS:   Review of Systems  Constitutional: Negative for chills, fever and weight loss.  HENT: Negative for ear discharge, ear pain and nosebleeds.   Eyes: Negative for blurred vision, pain and discharge.  Respiratory: Negative for sputum production, shortness of breath, wheezing and stridor.   Cardiovascular: Negative for chest pain, palpitations, orthopnea and PND.  Gastrointestinal: Negative for abdominal pain, diarrhea, melena, nausea and vomiting.  Genitourinary: Negative for frequency and urgency.  Musculoskeletal: Negative for back pain, falls and joint pain.  Neurological: Positive for weakness. Negative for sensory change, speech change and focal weakness.  Psychiatric/Behavioral: Negative for depression and hallucinations. The patient is not nervous/anxious.     DRUG ALLERGIES:  No Known Allergies  VITALS:  Blood pressure 117/72, pulse 84, temperature 98 F (36.7 C), temperature source Oral, resp. rate 20, height 6' (1.829 m), weight 60.5 kg, SpO2 99 %.  PHYSICAL EXAMINATION:   Physical Exam  GENERAL:  85 y.o.-year-old patient lying in the bed with no acute distress.  EYES: Pupils equal, round, reactive to light and accommodation. No scleral icterus. Extraocular muscles intact.  HEENT: Head atraumatic, normocephalic. Oropharynx and nasopharynx clear.  + Ecchymoses on his forehead NECK:  Supple, no jugular venous distention. No thyroid enlargement, no tenderness.  LUNGS: Normal breath sounds bilaterally, no wheezing, rales, rhonchi. No use of accessory muscles of respiration.  CARDIOVASCULAR: RRR, S1, S2 normal. No murmurs, rubs, or gallops.  ABDOMEN: Soft, nontender, nondistended. Bowel sounds  present. No organomegaly or mass.  EXTREMITIES: No cyanosis, clubbing or edema b/l.    NEUROLOGIC: Cranial nerves II through XII are intact. No focal Motor or sensory deficits b/l.  + Global weakness. PSYCHIATRIC:  patient is alert and oriented x 3.  SKIN: No obvious rash, lesion, or ulcer.   LABORATORY PANEL:  CBC Recent Labs  Lab 10/10/18 0508  WBC 15.6*  HGB 8.1*  HCT 25.6*  PLT 405*    Chemistries  Recent Labs  Lab 10/10/18 0508  NA 133*  K 4.1  CL 102  CO2 26  GLUCOSE 131*  BUN 13  CREATININE 0.60*  CALCIUM 8.2*  MG 1.9   Cardiac Enzymes No results for input(s): TROPONINI in the last 168 hours. RADIOLOGY:  No results found. ASSESSMENT AND PLAN:   85 year old male with history of hypertension who has had decreased appetite for several weeks who presents after multiple falls today and syncopal episode.  Fever- possibly from infected PEG site -Currently fever resolved -  Wound culture done from the PEG site-revealing abundant Staphylococcus aureus.  Reintubated -Blood cultures no growth in 24 hours, urinalysis negative -WBC 11.4-12.4-15.6.  Check a.m. labs -Chest x-ray-no acute findings -Procalcitonin 0.58 -COVID neg 7/3  -Started empiric antibiotics cefepime and vancomycin.  ID consult placed.  Discussed with Dr. 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 -Seen by GI-patient preferred outpatient colonoscopy -EGD 7/6 with a nonbleeding duodenal ulcer -Hematology recommending ferrous sulfate 325 mg daily  Urinary retention -Continue Foley  Hypertension- BPs soft -Holding home lisinopril  History of prostate cancer -Monitor  gen weakness- PT -SNF   CODE STATUS: full  DVT Prophylaxis: SCDs  TOTAL TIME TAKING CARE OF THIS PATIENT: 35 minutes.  >50% time spent on counselling and coordination of care   Note: This dictation was prepared with Dragon dictation along with smaller phrase technology. Any transcriptional errors that result from this process are unintentional.  Plan of care discussed with the patient's daughter [name] M.D on 10/11/2018 at 6:18 PM  Between 7am to 6pm - Pager - [phone]  After 6pm go to [email or web address] - password [email or web address] [facility]  Office  [phone]  CC: Primary care physician; [name], MDPatient ID: [name], male   DOB: [birth year], 85 y.o.   MRN: [id number]

## 2018-10-11 NOTE — Progress Notes (Signed)
Hematology/Oncology Progress Note [facility] Telephone:([phone] [phone]  Patient Care Team: [name], MD as PCP - General (Family Medicine)   Name of the patient: [name]  [id number]  Sep 06, 1933  Date of visit: 10/11/18   INTERVAL HISTORY-  Patient is lying in the bed comfortably, watching TV. Denies any pain, fever or chills. No acute overnight events. He is currently started on antibiotics due to pack tube site infection.     Review of systems- Review of Systems  Constitutional: Positive for fatigue. Negative for chills and fever.  Respiratory: Negative for chest tightness and cough.   Cardiovascular: Negative for chest pain.  Gastrointestinal: Negative for abdominal pain and nausea.  Genitourinary: Negative for difficulty urinating.   Skin: Negative for rash.  Neurological: Negative for headaches.  Psychiatric/Behavioral: Negative for confusion.    No Known Allergies  Patient Active Problem List   Diagnosis Date Noted   Coagulopathy ([place])    Pressure injury of skin 10/03/2018   Dysphagia    Anemia    Thrombocytopenia ([facility])    Blood in stool    Elevated transaminase level    Syncope 09/19/2018     Past Medical History:  Diagnosis Date   Arthritis    Cancer [facility])    Prostate Cancer   Hypertension      Past Surgical History:  Procedure Laterality Date   ESOPHAGOGASTRODUODENOSCOPY (EGD) WITH PROPOFOL N/A 12/06/2017   Procedure: ESOPHAGOGASTRODUODENOSCOPY (EGD) WITH PROPOFOL;  Surgeon: Jonathon Bellows, MD;  Location: [facility] ENDOSCOPY;  Service: Gastroenterology;  Laterality: N/A;   PEG PLACEMENT N/A 10/03/2018   Procedure: PERCUTANEOUS ENDOSCOPIC GASTROSTOMY (PEG) PLACEMENT;  Surgeon: Lucilla Lame, MD;  Location: [facility] ENDOSCOPY;  Service: Endoscopy;  Laterality: N/A;   PROSTATE CRYOABLATION      Social History   Socioeconomic History   Marital status: Widowed    Spouse name: Not on file     Number of children: Not on file   Years of education: Not on file   Highest education level: Not on file  Occupational History   Not on file  Social Needs   Financial resource strain: Not on file   Food insecurity    Worry: Not on file    Inability: Not on file   Transportation needs    Medical: Not on file    Non-medical: Not on file  Tobacco Use   Smoking status: Former Smoker    Types: Pipe    Quit date: 03/30/2002    Years since quitting: 16.5   Smokeless tobacco: Never Used  Substance and Sexual Activity   Alcohol use: Yes   Drug use: Never   Sexual activity: Not on file  Lifestyle   Physical activity    Days per week: Not on file    Minutes per session: Not on file   Stress: Not on file  Relationships   Social connections    Talks on phone: Not on file    Gets together: Not on file    Attends religious service: Not on file    Active member of club or organization: Not on file    Attends meetings of clubs or organizations: Not on file    Relationship status: Not on file   Intimate partner violence    Fear of current or ex partner: Not on file    Emotionally abused: Not on file    Physically abused: Not on file    Forced sexual activity: Not on file  Other  Topics Concern   Not on file  Social History Narrative   Not on file     History reviewed. No pertinent family history.   Current Facility-Administered Medications:    0.9 %  sodium chloride infusion (Manually program via Guardrails IV Fluids), , Intravenous, Once, Allen Norris, Darren, MD   0.9 %  sodium chloride infusion, , Intravenous, PRN, Nicholes Mango, MD, Stopped at 10/11/18 0733   0.9 %  sodium chloride infusion, , Intravenous, PRN, Gouru, Aruna, MD   acetaminophen (TYLENOL) tablet 650 mg, 650 mg, Oral, Q6H PRN, Mayo, Pete Pelt, MD, 650 mg at 10/08/18 1632   ceFEPIme (MAXIPIME) 2 g in sodium chloride 0.9 % 100 mL IVPB, 2 g, Intravenous, Q8H, Gouru, Aruna, MD, Stopped at 10/11/18  0711   feeding supplement (OSMOLITE 1.5 CAL) liquid 1,000 mL, 1,000 mL, Per Tube, Continuous, Mayo, Pete Pelt, MD, Last Rate: 55 mL/hr at 10/10/18 1717, 1,000 mL at 10/10/18 1717   ferrous sulfate tablet 325 mg, 325 mg, Oral, Daily, Earlie Server, MD, 325 mg at 10/11/18 1017   free water 75 mL, 75 mL, Per Tube, Q4H, Mayo, Pete Pelt, MD, 75 mL at 10/11/18 1017   guaiFENesin-dextromethorphan (ROBITUSSIN DM) 100-10 MG/5ML syrup 5 mL, 5 mL, Oral, Q4H PRN, Mayo, Pete Pelt, MD, 5 mL at 10/05/18 2015   HYDROcodone-acetaminophen (NORCO/VICODIN) 5-325 MG per tablet 1 tablet, 1 tablet, Oral, Q6H PRN, Lucilla Lame, MD, 1 tablet at 10/10/18 0958   metoprolol tartrate (LOPRESSOR) tablet 25 mg, 25 mg, Per Tube, BID, Mayo, Pete Pelt, MD, 25 mg at 10/11/18 1017   morphine 2 MG/ML injection 1 mg, 1 mg, Intravenous, Q4H PRN, Lucilla Lame, MD, 1 mg at 10/09/18 8242   neomycin-bacitracin-polymyxin (NEOSPORIN) ointment packet, , Topical, Daily, Lucilla Lame, MD, 1 application at 35/36/14 1018   ondansetron (ZOFRAN) tablet 4 mg, 4 mg, Oral, Q6H PRN **OR** ondansetron (ZOFRAN) injection 4 mg, 4 mg, Intravenous, Q6H PRN, Allen Norris, Darren, MD   pantoprazole (PROTONIX) injection 40 mg, 40 mg, Intravenous, Q12H, Wohl, Darren, MD, 40 mg at 10/11/18 1018   polyethylene glycol (MIRALAX / GLYCOLAX) packet 17 g, 17 g, Oral, Daily PRN, Allen Norris, Darren, MD   vancomycin (VANCOCIN) 1,250 mg in sodium chloride 0.9 % 250 mL IVPB, 1,250 mg, Intravenous, Q24H, Hallaji, Sheema M, RPH, Stopped at 10/10/18 2151   vitamin C (ASCORBIC ACID) tablet 250 mg, 250 mg, Per Tube, BID, Mayo, Pete Pelt, MD, 250 mg at 10/11/18 1017   Physical exam:  Vitals:   10/10/18 1647 10/10/18 1935 10/11/18 0526 10/11/18 0753  BP: 137/79 123/76 123/83 128/83  Pulse: 85 92 86 89  Resp: 18 20 20 20   Temp: 99.7 F (37.6 C) 98.8 F (37.1 C) 97.7 F (36.5 C) 98.3 F (36.8 C)  TempSrc: Oral Oral Oral Oral  SpO2: 100% 98% 98% 99%  Weight:      Height:        Physical Exam  Constitutional: No distress.  Frail appearance, lying the bed  HENT:  Mouth/Throat: No oropharyngeal exudate.  Left forehead bruising Oral mucosa dry  Eyes: Pupils are equal, round, and reactive to light. EOM are normal. No scleral icterus.  Neck: Normal range of motion. Neck supple.  Cardiovascular: Normal rate and regular rhythm.  No murmur heard. Pulmonary/Chest: Effort normal. No respiratory distress.  Abdominal: Soft. He exhibits no distension.  PEG tube in place.    Musculoskeletal: Normal range of motion.        General: No edema.  Neurological: He is  alert.  Speech is close to his baseline.  Skin: Skin is warm and dry. No erythema.  Bruises  Psychiatric: Affect normal.       CMP Latest Ref Rng & Units 10/10/2018  Glucose 70 - 99 mg/dL 131(H)  BUN 8 - 23 mg/dL 13  Creatinine 0.61 - 1.24 mg/dL 0.60(L)  Sodium 135 - 145 mmol/L 133(L)  Potassium 3.5 - 5.1 mmol/L 4.1  Chloride 98 - 111 mmol/L 102  CO2 22 - 32 mmol/L 26  Calcium 8.9 - 10.3 mg/dL 8.2(L)  Total Protein 6.5 - 8.1 g/dL -  Total Bilirubin 0.3 - 1.2 mg/dL -  Alkaline Phos 38 - 126 U/L -  AST 15 - 41 U/L -  ALT 0 - 44 U/L -   CBC Latest Ref Rng & Units 10/10/2018  WBC 4.0 - 10.5 K/uL 15.6(H)  Hemoglobin 13.0 - 17.0 g/dL 8.1(L)  Hematocrit 39.0 - 52.0 % 25.6(L)  Platelets 150 - 400 K/uL 405(H)    RADIOGRAPHIC STUDIES: I have personally reviewed the radiological images as listed and agreed with the findings in the report.  Ct Abdomen Wo Contrast  Result Date: 09/28/2018 CLINICAL DATA:  Dysphagia. Evaluate anatomy prior to potential percutaneous gastrostomy tube placement EXAM: CT ABDOMEN WITHOUT CONTRAST TECHNIQUE: Multidetector CT imaging of the abdomen was performed following the standard protocol without IV contrast. COMPARISON:  CT abdomen pelvis-01/20/2017; abdominal ultrasound-09/20/2018 FINDINGS: Lack of intravenous contrast limits the ability to evaluate solid abdominal organs.  Lower chest: Limited visualization of the lower thorax demonstrates small bilateral effusions with associated bibasilar consolidative opacities. Normal heart size. Coronary artery calcifications. Trace amount of pericardial fluid, presumably physiologic. There is diffuse decreased attenuation of the intra cardiac blood pool suggestive of anemia. Hepatobiliary: Normal hepatic contour. Scattered punctate granuloma are seen within the liver. Normal noncontrast appearance of the gallbladder given degree distention. No radiopaque gallstones. No ascites. Pancreas: Normal noncontrast appearance of the pancreas. Spleen: Punctate granuloma within otherwise normal-appearing spleen. Adrenals/Urinary Tract: Note is made of 3 nonobstructing left-sided renal stones with dominant nonobstructing stone within the interpolar aspect of the left kidney measuring 1.1 x 0.7 cm (coronal image 46, series 5). Note is made of a punctate (approximately 3 mm) nonobstructing stone within the inferior pole of the right kidney (coronal image 9, series 5). Previously characterized bilateral renal cysts are grossly unchanged with dominant partially exophytic cyst arising from the inferior pole of the right kidney measuring approximately 8.0 x 6.1 cm and is again noted to have thin peripheral mural calcifications, unchanged compared to contrast-enhanced CT scan performed 01/20/2017. No urinary obstruction. Normal noncontrast appearance of the bilateral adrenal glands. The urinary bladder was not imaged. Stomach/Bowel: The anterior wall of the stomach is well apposed against the ventral wall of the upper abdomen without interposed liver or transverse colon. No hiatal hernia. Ingested enteric contrast is seen within the colon. No definite evidence of enteric obstruction. No pneumoperitoneum, pneumatosis or portal venous gas. Vascular/Lymphatic: Atherosclerotic plaque within a normal caliber abdominal aorta. No bulky retroperitoneal, mesenteric, pelvic  or inguinal lymphadenopathy on this noncontrast examination. Other: Regional soft tissues are normal. Musculoskeletal: No acute or aggressive osseous abnormalities. Stigmata of DISH within the thoracic spine. IMPRESSION: 1. Gastric anatomy amenable to attempted percutaneous gastrostomy tube placement as indicated. 2. Small bilateral effusions with associated basilar opacities - atelectasis versus infiltrate. 3. Sequela of prior granulomatous infection as above. 4.  Aortic Atherosclerosis (ICD10-I70.0). Electronically Signed   By: Sandi Mariscal M.D.   On: 09/28/2018 16:48  Dg Chest 1 View  Result Date: 10/07/2018 CLINICAL DATA:  85 year old male with history of fever. Lightheadedness. Former smoker. EXAM: CHEST  1 VIEW COMPARISON:  Chest x-ray 10/04/2018. FINDINGS: Extensive skin fold artifact projecting over the lateral right hemithorax. Lung volumes are normal. No consolidative airspace disease. No pleural effusions. No pneumothorax. No pulmonary nodule or mass noted. Pulmonary vasculature and the cardiomediastinal silhouette are within normal limits. Aortic atherosclerosis. Old healed right-sided rib fractures are again incidentally noted. IMPRESSION: 1. No radiographic evidence of acute cardiopulmonary disease. 2. Aortic atherosclerosis. Electronically Signed   By: Vinnie Langton M.D.   On: 10/07/2018 08:34   Dg Chest 1 View  Result Date: 10/04/2018 CLINICAL DATA:  Cough EXAM: CHEST  1 VIEW COMPARISON:  09/19/2018 FINDINGS: Cardiac shadows within normal limits. Aortic calcifications are again seen. Old rib fractures are noted with healing on the right. Multiple skin folds are noted bilaterally. The lungs are well aerated without focal infiltrate or sizable effusion. No pneumothorax is noted. IMPRESSION: No acute abnormality noted. Electronically Signed   By: Inez Catalina M.D.   On: 10/04/2018 16:10   Dg Chest 1 View  Result Date: 09/19/2018 CLINICAL DATA:  85 year old male with weakness and fever.  COVID-19 status pending. EXAM: CHEST  1 VIEW COMPARISON:  CT Abdomen and Pelvis 01/20/2017. FINDINGS: Portable AP upright view at 1506 hours. Calcified aortic atherosclerosis. Other mediastinal contours are within normal limits. Visualized tracheal air column is within normal limits. Lung volumes are at the upper limits of normal. No pneumothorax, pleural effusion or pulmonary edema. No confluent pulmonary opacity. External button artifact suspected over the anterior 2nd rib on image #1. Chronic appearing posterior right rib fractures. Negative visible bowel gas pattern. IMPRESSION: No acute cardiopulmonary abnormality. Electronically Signed   By: Genevie Ann M.D.   On: 09/19/2018 15:39   Dg Pelvis 1-2 Views  Result Date: 09/20/2018 CLINICAL DATA:  Multiple falls.  Bilateral hip pain and weakness. EXAM: PELVIS - 1-2 VIEW COMPARISON:  CT 01/20/2017. FINDINGS: Diffuse osteopenia. Degenerative change both hips. No evidence of fracture or dislocation. A stable tiny sclerotic density noted in the left pubis consistent with a bone island. Pelvic calcifications consistent phleboliths. Peripheral vascular calcification. IMPRESSION: 1. Diffuse osteopenia. Degenerative change both hips. No acute bony abnormality. 2.  Peripheral vascular disease. Electronically Signed   By: Marcello Moores  Register   On: 09/20/2018 09:56   Ct Head Wo Contrast  Result Date: 10/05/2018 CLINICAL DATA:  Left subdural hemorrhage. EXAM: CT HEAD WITHOUT CONTRAST TECHNIQUE: Contiguous axial images were obtained from the base of the skull through the vertex without intravenous contrast. COMPARISON:  CT scan of September 21, 2018. FINDINGS: Brain: Mild diffuse cortical atrophy is noted. Mild chronic ischemic white matter disease is noted. Subdural hematoma noted over left cerebral cortex on prior exam is again noted and grossly stable in size. Maximum thickness is stable approximately 8 mm. There does appear to be slightly increased left tentorial and posterior  left para falcine hematoma. Ventricular size is within normal limits. Vascular: No hyperdense vessel or unexpected calcification. Skull: Normal. Negative for fracture or focal lesion. Sinuses/Orbits: No acute finding. Other: None. IMPRESSION: Subdural hematoma overlying left cerebral cortex noted on prior exam is again noted and grossly stable in size, although appears to be more heterogeneous in density compared to prior exam. There does appear to be slightly increased subdural hematoma involving the left posterior parafalcine region and tentorial region. No significant midline shift is noted currently. Electronically Signed   By:  Marijo Conception M.D.   On: 10/05/2018 15:01   Ct Head Wo Contrast  Result Date: 09/21/2018 CLINICAL DATA:  Follow-up examination for subdural hemorrhage. EXAM: CT HEAD WITHOUT CONTRAST TECHNIQUE: Contiguous axial images were obtained from the base of the skull through the vertex without intravenous contrast. COMPARISON:  Previous brain MRI from earlier the same day. FINDINGS: Brain: Again seen is a left holo hemispheric subdural hematoma. This measures up to 8 mm in maximal dimension at the level of the left frontal lobe. Extension along the falx, with left parafalcine component measuring up to 6 mm. Mild mass effect with associated trace 2 mm left-to-right shift. Overall, appearance is stable from previous MRI. No other definite acute intracranial hemorrhage. No acute large vessel territory infarct. Previously identified punctate left frontal cortical infarct not seen by CT. Underlying atrophy with chronic microvascular ischemic disease noted. No mass lesion. No hydrocephalus. Vascular: No hyperdense vessel. Scattered vascular calcifications noted within the carotid siphons. Skull: Evolving left frontal scalp contusion/hematoma. Calvarium intact. Sinuses/Orbits: Globes and orbital soft tissues within normal limits. Paranasal sinuses and mastoid air cells are clear. Other: None.  IMPRESSION: 1. Stable size and appearance of left holo hemispheric subdural hematoma measuring up to 8 mm in maximal dimension. Associated trace 2 mm left-to-right shift. 2. Evolving left frontal scalp contusion/hematoma. 3. No other new acute intracranial process. Electronically Signed   By: Jeannine Boga M.D.   On: 09/21/2018 18:45   Ct Head Wo Contrast  Result Date: 09/19/2018 CLINICAL DATA:  Pt states he fell and hit his head on the bath tub today. Pt denies LOC EXAM: CT HEAD WITHOUT CONTRAST TECHNIQUE: Contiguous axial images were obtained from the base of the skull through the vertex without intravenous contrast. COMPARISON:  None. FINDINGS: Brain: No evidence of acute infarction, hemorrhage, extra-axial collection, ventriculomegaly, or mass effect. Generalized cerebral atrophy. Periventricular white matter low attenuation likely secondary to microangiopathy. Vascular: Cerebrovascular atherosclerotic calcifications are noted. Skull: Negative for fracture or focal lesion. Sinuses/Orbits: Visualized portions of the orbits are unremarkable. Visualized portions of the paranasal sinuses and mastoid air cells are unremarkable. Other: Large left frontal scalp hematoma. IMPRESSION: No acute intracranial pathology. Electronically Signed   By: Kathreen Devoid   On: 09/19/2018 15:42   Mr Brain Wo Contrast  Result Date: 09/21/2018 CLINICAL DATA:  Initial evaluation for acute speech difficulty, recent falls. EXAM: MRI HEAD WITHOUT CONTRAST TECHNIQUE: Multiplanar, multiecho pulse sequences of the brain and surrounding structures were obtained without intravenous contrast. COMPARISON:  Prior CT from 09/19/2018. FINDINGS: Brain: Generalized age-related cerebral atrophy with mild chronic small vessel ischemic disease. Remote lacunar infarct present at the right external capsule/lentiform nucleus. Small amount of associated chronic hemosiderin staining. There has been interval development of an acute subdural  hematoma overlying the left cerebral convexity. This measures up to 8 mm in maximal diameter. Extension along the falx. Associated trace 2 mm left-to-right shift. No hydrocephalus or ventricular trapping. Basilar cisterns remain patent. 4 mm focus of diffusion abnormality involving the left frontal cortex at the level of the operculum suspicious for a small acute ischemic infarct (series 7, image 22). No associated hemorrhage. No other evidence for acute or subacute ischemia. Gray-white matter differentiation otherwise maintained. Pituitary gland prominent with abnormal convex border superiorly (series 9, image 12). No other mass lesion. Midline structures intact. Vascular: Major intracranial vascular flow voids are maintained. Skull and upper cervical spine: Craniocervical junction within normal limits. Multilevel degenerative spondylolysis noted within the upper cervical spine without high-grade  stenosis. Bone marrow signal intensity within normal limits. Evolving soft tissue contusion/hematoma at the left frontal scalp. Sinuses/Orbits: Globes and orbital soft tissues within normal limits. Paranasal sinuses are clear. Small right mastoid effusion noted, of doubtful significance. Inner ear structures grossly normal. Other: None. IMPRESSION: 1. Delayed intracranial hemorrhage, with interval development of acute left subdural hematoma, measuring up to 8 mm in maximal thickness. Associated mild mass effect with trace 2 mm left-to-right shift. 2. 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 3. Underlying age-related cerebral atrophy with mild chronic microvascular ischemic disease. 4. Abnormal in prominent appearance of the pituitary gland, raising the possibility for an underlying pituitary lesion. Correlation with laboratory values recommended. Additionally, further assessment with dedicated pituitary protocol MRI suggested for further evaluation. This could be  performed on a nonemergent outpatient basis. Critical Value/emergent results were called by telephone at the time of interpretation on 09/21/2018 at 2:30 pm to Dr. Fritzi Mandes , who verbally acknowledged these results. Electronically Signed   By: Jeannine Boga M.D.   On: 09/21/2018 14:37   US Abdomen Complete  Result Date: 09/20/2018 CLINICAL DATA:  Elevated liver function tests. EXAM: ABDOMEN ULTRASOUND COMPLETE COMPARISON:  CT abdomen and pelvis 01/20/2017. FINDINGS: Gallbladder: No gallstones or wall thickening visualized. No sonographic Murphy sign noted by sonographer. Common bile duct: Diameter: 0.2 cm Liver: No focal lesion identified. Within normal limits in parenchymal echogenicity. Portal vein is patent on color Doppler imaging with normal direction of blood flow towards the liver. IVC: No abnormality visualized. Pancreas: Visualized portion unremarkable. Spleen: Size and appearance within normal limits. A few small calcifications in the spleen consistent with old granulomatous disease are seen as on prior CT. Right Kidney: Length: 12.5 cm. Echogenicity within normal limits. No solid mass or hydronephrosis visualized. 7.4 cm in diameter cyst is identified as seen on prior CT. Calcification in the wall of this cyst is unchanged. Left Kidney: Length: 12.5 cm. Echogenicity within normal limits. No solid mass or hydronephrosis visualized. Two simple cysts measuring 2.0 and 1.8 cm in diameter are unchanged. A 1.5 cm stone in the mid to lower pole is identified and was present on the prior CT. Abdominal aorta: No aneurysm visualized.  Atherosclerosis noted. Other findings: None. IMPRESSION: No acute abnormality.  Normal-appearing liver and gallbladder. Nonobstructing stone lower pole left kidney. Bilateral renal cysts. Atherosclerosis. Electronically Signed   By: Inge Rise M.D.   On: 09/20/2018 10:24   Dg Swallowing Func-speech Pathology  Result Date: 10/10/2018 Please refer to "Notes" tab  for Speech Pathology notes.  Dg Swallowing Func-speech Pathology  Result Date: 10/10/2018 Please refer to "Notes" tab for Speech Pathology notes.   Assessment and plan-  Patient is a 85 y.o. male history of hypertension, history of prostate cancer, presented for evaluation of multiple falls and generalized weakness.   #Anemia, iron deficiency. Hemoglobin around 8.  Gradually decreasing. Reticulocyte panel showed a decreased reticulocyte hemoglobin.  Which is indication of iron deficient. Likely due to chronic blood loss from GI tract. Patient has been started on ferrous sulfate 325 mg daily per last week. I had a discussion with patient about IV iron.  Patient prefers me to discuss with his daughter [name].  Called daughter and discussed about option of IV Venofer. Allergy reactions/infusion reaction including anaphylactic reaction discussed with daughter, Other side effects include but not limited to high blood pressure, skin rash, weight gain, leg swelling, etc. Daughter voices understanding and decides to proceed with IV Venofer.  Plan Venofer 200mg  x 1 today, with benadryl 50mg  PO as premed.   I also discussed with Daughter about the possibility of other factors which contribute to his anemia.  MDS and other bone marrow infiltrative disease need to be ruled out if patient's hemoglobin does not improve. Patient needs to follow-up outpatient for additional work-up.   # History of prostate cancer, outpatient follow up. PSA elevated at 12.82. outpatient follow up #Intracranial hematoma/bleeding, secondary to injury.  PT/OT.  #Dysphagia, PEG tube infection.  On antibiotics managed by primary team. Above plan was discussed with Dr. Berline Lopes  Thank you for allowing me to participate in the care of this patient.    Earlie Server, MD, PhD 10/11/2018

## 2018-10-11 NOTE — Progress Notes (Signed)
Daughter [name] updated over the phone this afternoon. No new concerns or questions.

## 2018-10-11 NOTE — Progress Notes (Signed)
New referral for outpatient Palliative to follow at [facility] post discharge. CMRN Geoffry Paradise aware. Patient information faxed to referral. Flo Shanks BSN, RN, [place] [phone]

## 2018-10-11 NOTE — Progress Notes (Signed)
Ch f/u with pt to see how well he was progressing. Pt had a pleasant affect and was able to hold a conversation which was a significant improvement compared to initial visits. Pt shared that he was ready to go [facility] for rehab. Ch was aware that pt was receiving antibiotics after getting an infection near his PEG tube site. Ch affirmed pt by letting him know that this was a common occurrence. Pt was frustrated about not being informed by anyone that he had an infection. Ch understood and provided space for pt to lament. Pt shared that he hopes to have the strength to participate in PT at rehab considering he rejected PT yesterday (as pt reported).  F/U: communicate with care team/ check on dgt of pt    10/11/18 0900  Clinical Encounter Type  Visited With Patient  Visit Type Follow-up;Social support  Spiritual Encounters  Spiritual Needs Emotional;Grief support  Stress Factors  Patient Stress Factors Health changes  Family Stress Factors Exhausted;Loss of control  Advance Directives (For Healthcare)  Does Patient Have a Medical Advance Directive? Yes  Does patient want to make changes to medical advance directive? No - Patient declined  Type of Paramedic of Attorney

## 2018-10-11 NOTE — Progress Notes (Signed)
Physical Therapy Treatment Patient Details Name: [name] MRN: [id number] DOB: 1933-04-08 Today's Date: 10/11/2018    History of Present Illness 84yoM admitted for syncope with multiple falls at home. Complains of hip pain upon arrival to hospital. History includes HTN and prostate cancer.  Noted multiple abrasions to body. Underwent PEG placement on 7/6.    PT Comments    Pt initially reports that he just woke and does not feel like he is able to do anything at this time.  With minimal cuing, however, he did agree to do some exercises in bed but still did not feel like mobility/walking would be doable for him today.  Pt showed very good effort with bed exercises and was able to show good quality of movement and good relative strength.  Pt grateful that he did participate and reports feeling good about how he did.   Follow Up Recommendations  SNF     Equipment Recommendations  (TBD at next venue)    Recommendations for Other Services       Precautions / Restrictions Precautions Precautions: Fall Restrictions Weight Bearing Restrictions: No    Mobility  Bed Mobility               General bed mobility comments: Pt deferred mobility today stating that he was tired, just woke up and wasn't feeling strong/awake enough to get to do any mobility today  Transfers                    Ambulation/Gait                 Stairs             Wheelchair Mobility    Modified Rankin (Stroke Patients Only)       Balance                                            Cognition Arousal/Alertness: Awake/alert Behavior During Therapy: WFL for tasks assessed/performed Overall Cognitive Status: Within Functional Limits for tasks assessed                                        Exercises General Exercises - Lower Extremity Ankle Circles/Pumps: AROM;10 reps Short Arc Quad: Strengthening;10 reps;Both Heel Slides:  Strengthening;10 reps;Both(with resisted leg extensions) Hip ABduction/ADduction: Strengthening;10 reps;Both Straight Leg Raises: AROM;10 reps;Both    General Comments        Pertinent Vitals/Pain Pain Assessment: No/denies pain(c/o of inchy back (PT did scratch pt's back for comfort))    Home Living                      Prior Function            PT Goals (current goals can now be found in the care plan section) Progress towards PT goals: Progressing toward goals    Frequency    Min 2X/week      PT Plan Current plan remains appropriate    Co-evaluation              AM-PAC PT "6 Clicks" Mobility   Outcome Measure  Help needed turning from your back to your side while in a flat bed without using bedrails?: A Little Help needed moving from lying  on your back to sitting on the side of a flat bed without using bedrails?: A Little Help needed moving to and from a bed to a chair (including a wheelchair)?: A Little Help needed standing up from a chair using your arms (e.g., wheelchair or bedside chair)?: A Little Help needed to walk in hospital room?: A Lot Help needed climbing 3-5 steps with a railing? : A Lot 6 Click Score: 16    End of Session Equipment Utilized During Treatment: Gait belt Activity Tolerance: Patient tolerated treatment well;Patient limited by fatigue Patient left: with bed alarm set;with call bell/phone within reach Nurse Communication: Mobility status PT Visit Diagnosis: Unsteadiness on feet (R26.81);Muscle weakness (generalized) (M62.81);History of falling (Z91.81);Difficulty in walking, not elsewhere classified (R26.2);Dizziness and giddiness (R42)     Time: 1157-2620 PT Time Calculation (min) (ACUTE ONLY): 15 min  Charges:  $Therapeutic Exercise: 8-22 mins                     Kreg Shropshire, DPT 10/11/2018, 4:39 PM

## 2018-10-11 NOTE — Plan of Care (Signed)
PMT note: In to speak with patient. Patient is resting in bed with eyes closed at this time. No signs of distress noted. Will reattempt at another time.   No charge note.

## 2018-10-11 NOTE — Progress Notes (Signed)
Spoke with daughter [name] over the phone this morning.

## 2018-10-11 NOTE — TOC Progression Note (Signed)
Transition of Care [facility]) - Progression Note    Patient Details  Name: [name] MRN: [id number] Date of Birth: Dec 25, 1933  Transition of Care [facility]) CM/SW Contact  Elza Rafter, RN Phone Number: 10/11/2018, 12:19 PM  Clinical Narrative:   [facility] has had a positive COVID resident.  They are testing all staff and residents.  No available bed today.      Expected Discharge Plan: Skilled Nursing Facility Barriers to Discharge: Continued Medical Work up  Expected Discharge Plan and Services Expected Discharge Plan: [place]   Discharge Planning Services: CM Consult                                           Social Determinants of Health (SDOH) Interventions    Readmission Risk Interventions Readmission Risk Prevention Plan 09/21/2018  Post Dischage Appt Complete  Medication Screening Complete  Transportation Screening Complete  Some recent data might be hidden

## 2018-10-11 NOTE — Plan of Care (Signed)
  Problem: Education: Goal: Knowledge of General Education information will improve Description: Including pain rating scale, medication(s)/side effects and non-pharmacologic comfort measures Outcome: Progressing   Problem: Clinical Measurements: Goal: Will remain free from infection Outcome: Progressing Note: Patient has been afebrile during my shift and drainage at peg site has reduced to almost no drainage.

## 2018-10-12 ENCOUNTER — Other Ambulatory Visit: Payer: Self-pay | Admitting: Oncology

## 2018-10-12 DIAGNOSIS — B951 Streptococcus, group B, as the cause of diseases classified elsewhere: Secondary | ICD-10-CM

## 2018-10-12 DIAGNOSIS — A7740 Ehrlichiosis, unspecified: Secondary | ICD-10-CM

## 2018-10-12 DIAGNOSIS — S0003XA Contusion of scalp, initial encounter: Secondary | ICD-10-CM

## 2018-10-12 DIAGNOSIS — I639 Cerebral infarction, unspecified: Secondary | ICD-10-CM

## 2018-10-12 DIAGNOSIS — D500 Iron deficiency anemia secondary to blood loss (chronic): Secondary | ICD-10-CM

## 2018-10-12 DIAGNOSIS — B9561 Methicillin susceptible Staphylococcus aureus infection as the cause of diseases classified elsewhere: Secondary | ICD-10-CM

## 2018-10-12 DIAGNOSIS — K9422 Gastrostomy infection: Secondary | ICD-10-CM

## 2018-10-12 LAB — GLUCOSE, CAPILLARY
Glucose-Capillary: 106 mg/dL — ABNORMAL HIGH (ref 70–99)
Glucose-Capillary: 112 mg/dL — ABNORMAL HIGH (ref 70–99)
Glucose-Capillary: 114 mg/dL — ABNORMAL HIGH (ref 70–99)
Glucose-Capillary: 119 mg/dL — ABNORMAL HIGH (ref 70–99)
Glucose-Capillary: 130 mg/dL — ABNORMAL HIGH (ref 70–99)

## 2018-10-12 LAB — CULTURE, BLOOD (ROUTINE X 2)
Culture: NO GROWTH
Culture: NO GROWTH
Special Requests: ADEQUATE
Special Requests: ADEQUATE

## 2018-10-12 LAB — BASIC METABOLIC PANEL
Anion gap: 5 (ref 5–15)
BUN: 16 mg/dL (ref 8–23)
CO2: 30 mmol/L (ref 22–32)
Calcium: 8.5 mg/dL — ABNORMAL LOW (ref 8.9–10.3)
Chloride: 101 mmol/L (ref 98–111)
Creatinine, Ser: 0.58 mg/dL — ABNORMAL LOW (ref 0.61–1.24)
GFR calc Af Amer: >60 mL/min (ref >60)
GFR calc non Af Amer: >60 mL/min (ref >60)
Glucose, Bld: 114 mg/dL — ABNORMAL HIGH (ref 70–99)
Potassium: 4.4 mmol/L (ref 3.5–5.1)
Sodium: 136 mmol/L (ref 135–145)

## 2018-10-12 LAB — SARS CORONAVIRUS 2 BY RT PCR (HOSPITAL ORDER, PERFORMED IN ~~LOC~~ HOSPITAL LAB): SARS Coronavirus 2: NEGATIVE

## 2018-10-12 LAB — PHOSPHORUS: Phosphorus: 3.2 mg/dL (ref 2.5–4.6)

## 2018-10-12 LAB — MAGNESIUM: Magnesium: 2 mg/dL (ref 1.7–2.4)

## 2018-10-12 MED ORDER — BACITRACIN-NEOMYCIN-POLYMYXIN 400-5-5000 EX OINT: TOPICAL_OINTMENT | Freq: Every day | CUTANEOUS | 0 refills | Status: AC

## 2018-10-12 MED ORDER — FREE WATER
30.0000 mL | Freq: Every day | Status: DC
  Administered 2018-10-12 – 2018-10-14 (×10): 30 mL

## 2018-10-12 MED ORDER — FREE WATER: 75.0000 mL | Status: AC

## 2018-10-12 MED ORDER — ACETAMINOPHEN 325 MG PO TABS: 650.0000 mg | ORAL_TABLET | Freq: Four times a day (QID) | ORAL | Status: AC | PRN

## 2018-10-12 MED ORDER — GUAIFENESIN-DM 100-10 MG/5ML PO SYRP: 5.0000 mL | ORAL_SOLUTION | Freq: Four times a day (QID) | ORAL | 0 refills | Status: DC | PRN

## 2018-10-12 MED ORDER — BISACODYL 10 MG RE SUPP
10.0000 mg | Freq: Once | RECTAL | Status: AC
  Administered 2018-10-12: 13:00:00 10 mg via RECTAL
  Filled 2018-10-12: qty 1

## 2018-10-12 MED ORDER — VITAMIN B-12 500 MCG PO TABS: 500.0000 ug | ORAL_TABLET | Freq: Every day | ORAL | Status: AC

## 2018-10-12 MED ORDER — CEPHALEXIN 500 MG PO CAPS: 500.0000 mg | ORAL_CAPSULE | Freq: Four times a day (QID) | ORAL | 0 refills | Status: DC

## 2018-10-12 MED ORDER — ASCORBIC ACID 250 MG PO TABS: 250.0000 mg | ORAL_TABLET | Freq: Two times a day (BID) | ORAL | Status: AC

## 2018-10-12 MED ORDER — DIPHENHYDRAMINE HCL 25 MG PO CAPS
25.0000 mg | ORAL_CAPSULE | Freq: Once | ORAL | Status: AC | PRN
  Administered 2018-10-12: 25 mg
  Filled 2018-10-12: qty 1

## 2018-10-12 MED ORDER — ONDANSETRON HCL 4 MG PO TABS: 4.0000 mg | ORAL_TABLET | Freq: Four times a day (QID) | ORAL | 0 refills | Status: AC | PRN

## 2018-10-12 MED ORDER — FLEET ENEMA 7-19 GM/118ML RE ENEM: 1.0000 | ENEMA | Freq: Once | RECTAL | Status: DC

## 2018-10-12 MED ORDER — OSMOLITE 1.5 CAL PO LIQD
120.0000 mL | Freq: Every day | ORAL | Status: DC
  Administered 2018-10-12 – 2018-10-13 (×6): 120 mL

## 2018-10-12 MED ORDER — HYDROCODONE-ACETAMINOPHEN 5-325 MG PO TABS: 1.0000 | ORAL_TABLET | Freq: Four times a day (QID) | ORAL | 0 refills | Status: DC | PRN

## 2018-10-12 MED ORDER — METOPROLOL TARTRATE 25 MG PO TABS: 25.0000 mg | ORAL_TABLET | Freq: Two times a day (BID) | ORAL | Status: AC

## 2018-10-12 MED ORDER — CEPHALEXIN 500 MG PO CAPS
500.0000 mg | ORAL_CAPSULE | Freq: Four times a day (QID) | ORAL | Status: DC
  Administered 2018-10-12 – 2018-10-14 (×8): 500 mg via ORAL
  Filled 2018-10-12 (×7): qty 1

## 2018-10-12 MED ORDER — POLYETHYLENE GLYCOL 3350 17 G PO PACK: 17.0000 g | PACK | Freq: Every day | ORAL | 0 refills | Status: AC | PRN

## 2018-10-12 MED ORDER — SODIUM CHLORIDE 0.9 % IV SOLN
200.0000 mg | Freq: Once | INTRAVENOUS | Status: AC
  Administered 2018-10-12: 200 mg via INTRAVENOUS
  Filled 2018-10-12: qty 10

## 2018-10-12 MED ORDER — OSMOLITE 1.5 CAL PO LIQD: 1000.0000 mL | ORAL | 0 refills | Status: DC

## 2018-10-12 NOTE — TOC Transition Note (Addendum)
Transition of Care [facility]) - CM/SW Discharge Note   Patient Details  Name: [name] MRN: [id number] Date of Birth: January 05, 1934  Transition of [facility]) CM/SW Contact:  Elza Rafter, RN Phone Number: 10/12/2018, 10:51 AM   Clinical Narrative:   [name] with [facility] notified this RNCM that they can accept patient today.  Notified dr. Margaretmary Eddy.  She will DC patient today.  Spoke with daughter [name] and she is in agreement with his discharge to [facility] today.  EMS packet placed on chart with signed DNR.  Ria Comment, RN aware she can call report after lunch.  No further needs identified.  Patient to be followed by out patient palliative.     Final next level of care: Skilled Nursing Facility Barriers to Discharge: No Barriers Identified   Patient Goals and CMS Choice Patient states their goals for this hospitalization and ongoing recovery are:: to fix this swallowing problem CMS Medicare.gov Compare Post Acute Care list provided to:: Patient Choice offered to / list presented to : Patient  Discharge Placement              Patient chooses bed at: [facility]  Patient to be transferred to facility by: [facility] Name of family member notified: Sheri Patient and family notified of of transfer: 10/19/18  Discharge Plan and Services   Discharge Planning Services: CM Consult                                 Social Determinants of Health ([place]) Interventions     Readmission Risk Interventions Readmission Risk Prevention Plan 09/21/2018  Post Dischage Appt Complete  Medication Screening Complete  Transportation Screening Complete  Some recent data might be hidden

## 2018-10-12 NOTE — Progress Notes (Addendum)
Nutrition Follow Up Note   DOCUMENTATION CODES:   Underweight  INTERVENTION:   Change to Osmolite 1.5- 6 cans daily- Initiate 1/2 can 6 times daily and then increase to 1 full can as pt tolerates.    Free water flushes 30ml before and after each tube feeding   Regimen provides 2130kcal/day, 89g/day protein, 1446ml/day free water   Vitamin C 250mg BID via tube  NUTRITION DIAGNOSIS:   Inadequate oral intake related to acute illness as evidenced by meal completion < 50%. -pt with tube feeds  GOAL:   Patient will meet greater than or equal to 90% of their needs  -met with tube feeds  MONITOR:   Labs, Weight trends, Skin, I & O's, Diet advancement, tube feeds  ASSESSMENT:   84-year-old male with history of hypertension who has had decreased appetite for several weeks who presents after multiple falls today and syncopal episode.  RD working remotely.  Pt s/p PEG placement 7/6. Pt tolerating tube feeds well. PEG tube site with purulent discharge and pt febrile. Cultures pending. Refeed labs stable. No new weight since 6/25; will request daily weights. Pt discharging to SNF; needs to be on bolus tube feeds prior to discharge. Will transition over to bolus feeds starting tonight; pt can discharge once he is tolerating feeds.   Medications reviewed and include: cephalexin, protonix, vitamin C, iron sucrose  Labs reviewed: K 4.4 wnl, Mg 2.0 wnl, P 3.2 wnl cbgs- 106, 130, 114 x 24 hrs  Diet Order:   Diet Order            Diet NPO time specified  Diet effective now             EDUCATION NEEDS:   No education needs have been identified at this time  Skin:  Skin Assessment: Reviewed RN Assessment  Last BM:  7/15- type 6  Height:   Ht Readings from Last 1 Encounters:  09/19/18 6' (1.829 m)    Weight:   Wt Readings from Last 1 Encounters:  09/22/18 60.5 kg    Ideal Body Weight:  80.9 kg  BMI:  Body mass index is 18.09 kg/m.  Estimated Nutritional Needs:    Kcal:  1700-2000kcal/day  Protein:  85-100g/day  Fluid:  >1.5L/day  Casey Campbell MS, RD, LDN Pager #- [phone] Office#- [phone] After Hours Pager: [phone] 

## 2018-10-12 NOTE — Progress Notes (Signed)
Pts daughter, [name], updated on pts condition and plan of care by this RN at 1400.

## 2018-10-12 NOTE — Discharge Summary (Signed)
[facility] [place] NAME: [name]    MR#:  [id number]  DATE OF BIRTH:  [birth year]  DATE OF ADMISSION:  09/19/2018 ADMITTING PHYSICIAN: Glenn Costa, MD  DATE OF DISCHARGE: 10/12/18   PRIMARY CARE PHYSICIAN: Glenn Maple, MD    ADMISSION DIAGNOSIS:  Syncope and collapse [R55] Hypokalemia [E87.6] Elevated troponin I level [R79.89] Minor head injury, initial encounter [S09.90XA] Syncope [R55]  DISCHARGE DIAGNOSIS:  Subdural hematoma Severe dysphagia status post PEG tube- strict NPO PEG site infection Acute urinary retention continue Foley catheter Tick bite completed the course of treatment Acute on chronic anemia SECONDARY DIAGNOSIS:   Past Medical History:  Diagnosis Date   Arthritis    Cancer [facility])    Prostate Cancer   Hypertension     HOSPITAL COURSE:  [name]  is a 85 y.o. male with a known history of hypertension who presents today to the emergency room due to for falls today at home.  Patient reports for the past 3 days he has had dizziness and feelings of lightheadedness.  He denies chest pain, shortness of breath, cough, fever or urinary symptoms.  He was noted to have large amount of bruising on the left side of his forehead and skin tears.  His blood pressure was also noted to be low when EMS arrived.  He was given 250 cc normal saline bolus.   Patient does report since he has been secluded from friends he has decreased appetite.  He continues to take medication for hypertension.  Fever-  from infected PEG site -Currently fever resolved.  Clinically improving -  Wound culture done from the PEG site-revealing abundant Staphylococcus aureus.   Treated with cefepime and vancomycin initially subsequently cefepime discontinued and plan is to discharge patient with Keflex  -Blood cultures no growth in 24 hours, urinalysis negative -Chest x-ray-no acute findings -Procalcitonin 0.58 -COVID neg 7/3   -  ID consult placed.  Discussed with Dr. Levester [name]  Severe persistent dysphagia -Patient had MBS done -s/p PEG placement 7/6 -Continue tube feeds -Seen by palliative care, who recommended outpatient palliative f/u  Left subdural hematoma s/p fall at home with left frontal cortex CVA -Repeat CT head 6/23 showed a stable hematoma -Holding aspirin and DVT prophylaxis -Patient has been evaluated by neurosurgery -CT head with possible pituitary lesion will repeat MRI of the brain in 2 months regarding the same  Orthostatic hypotension-resolved with IV fluids -Continue IV fluids for today -Recheck orthostatic vitals in the morning -Recent ECHO with normal EF -Repeat CT head was stable -Holding home lisinopril  Tachycardia- resolved. -Recent TSH normal -Continue metoprolol 25mg  bid -Continue cardiac monitoring  [name] infection in the setting of tick bite -Has received full course of doxycycline -Seen by ID  Acute on chronic anemia with melena and left subdural hematoma- hemoglobin has been stable. -Seen by GI-patient preferred outpatient colonoscopy -EGD 7/6 with a nonbleeding duodenal ulcer -Hematology recommending ferrous sulfate 325 mg daily  Urinary retention -Continue Foley  Hypertension- BPs soft -Holding home lisinopril  History of prostate cancer -Monitor  gen weakness- PT -SNF   DISCHARGE CONDITIONS:     CONSULTS OBTAINED:  Treatment Team:  Glenn Kida, MD Glenn Server, MD Glenn Maw, MD Glenn Billing, MD   PROCEDURES PEG tube placement  DRUG ALLERGIES:  No Known Allergies  DISCHARGE MEDICATIONS:   Allergies as of 10/12/2018   No Known Allergies     Medication List  STOP taking these medications   aspirin 325 MG EC tablet   Calcium 600+D 600-400 MG-UNIT tablet Generic drug: Calcium Carbonate-Vitamin D   lisinopril 40 MG tablet Commonly known as: ZESTRIL   Osteo Bi-Flex Adv Joint  Shield Tabs     TAKE these medications   acetaminophen 325 MG tablet Commonly known as: TYLENOL Place 2 tablets (650 mg total) into feeding tube every 6 (six) hours as needed for mild pain.   ascorbic acid 250 MG tablet Commonly known as: VITAMIN C Place 1 tablet (250 mg total) into feeding tube 2 (two) times daily.   cephALEXin 500 MG capsule Commonly known as: KEFLEX Place 1 capsule (500 mg total) into feeding tube every 6 (six) hours.   feeding supplement (OSMOLITE 1.5 CAL) Liqd Place 1,000 mLs into feeding tube continuous.   free water Soln Place 75 mLs into feeding tube every 4 (four) hours.   guaiFENesin-dextromethorphan 100-10 MG/5ML syrup Commonly known as: ROBITUSSIN DM Place 5 mLs into feeding tube every 6 (six) hours as needed for cough.   HYDROcodone-acetaminophen 5-325 MG tablet Commonly known as: NORCO/VICODIN Place 1 tablet into feeding tube every 6 (six) hours as needed for moderate pain.   metoprolol tartrate 25 MG tablet Commonly known as: LOPRESSOR Place 1 tablet (25 mg total) into feeding tube 2 (two) times daily.   neomycin-bacitracin-polymyxin ointment Commonly known as: NEOSPORIN Apply topically daily. Start taking on: October 13, 2018   ondansetron 4 MG tablet Commonly known as: ZOFRAN Place 1 tablet (4 mg total) into feeding tube every 6 (six) hours as needed for nausea.   polyethylene glycol 17 g packet Commonly known as: MIRALAX / GLYCOLAX Place 17 g into feeding tube daily as needed for mild constipation.   vitamin B-12 500 MCG tablet Commonly known as: CYANOCOBALAMIN Place 1 tablet (500 mcg total) into feeding tube daily. What changed: how to take this        DISCHARGE INSTRUCTIONS:   Follow-up with primary care physician in 2 to 3 days Follow-up with urology in 10 days continue Foley catheter for urinary retention Follow-up with neurology as an outpatient for subdural hematoma Follow-up with gastroenterology for outpatient  colonoscopy  DIET:  NPO ,PEG feeds  DISCHARGE CONDITION:  Fair  ACTIVITY:  Activity as tolerated per PT  OXYGEN:  Home Oxygen: No.   Oxygen Delivery: room air  DISCHARGE LOCATION:  nursing home   If you experience worsening of your admission symptoms, develop shortness of breath, life threatening emergency, suicidal or homicidal thoughts you must seek medical attention immediately by calling 911 or calling your MD immediately  if symptoms less severe.  You Must read complete instructions/literature along with all the possible adverse reactions/side effects for all the Medicines you take and that have been prescribed to you. Take any new Medicines after you have completely understood and accpet all the possible adverse reactions/side effects.   Please note  You were cared for by a hospitalist during your hospital stay. If you have any questions about your discharge medications or the care you received while you were in the hospital after you are discharged, you can call the unit and asked to speak with the hospitalist on call if the hospitalist that took care of you is not available. Once you are discharged, your primary care physician will handle any further medical issues. Please note that NO REFILLS for any discharge medications will be authorized once you are discharged, as it is imperative that you return to your primary  care physician (or establish a relationship with a primary care physician if you do not have one) for your aftercare needs so that they can reassess your need for medications and monitor your lab values.     Today  Chief Complaint  Patient presents with   Fall   Patient is feeling fine.  Denies any complaints.  Resting comfortably no overnight incidents  ROS:  CONSTITUTIONAL: Denies fevers, chills. Denies any fatigue, weakness.  EYES: Denies blurry vision, double vision, eye pain. EARS, NOSE, THROAT: Denies tinnitus, ear pain, hearing loss. RESPIRATORY:  Denies cough, wheeze, shortness of breath.  CARDIOVASCULAR: Denies chest pain, palpitations, edema.  GASTROINTESTINAL: Denies nausea, vomiting, diarrhea, abdominal pain. Denies bright red blood per rectum. GENITOURINARY: Denies dysuria, hematuria. ENDOCRINE: Denies nocturia or thyroid problems. HEMATOLOGIC AND LYMPHATIC: Denies easy bruising or bleeding. SKIN: Denies rash or lesion. MUSCULOSKELETAL: Denies pain in neck, back, shoulder, knees, hips or arthritic symptoms.  NEUROLOGIC: Denies paralysis, paresthesias.  PSYCHIATRIC: Denies anxiety or depressive symptoms.   VITAL SIGNS:  Blood pressure 105/70, pulse 78, temperature 98.3 F (36.8 C), temperature source Oral, resp. rate 19, height 6' (1.829 m), weight 60.5 kg, SpO2 99 %.  I/O:    Intake/Output Summary (Last 24 hours) at 10/12/2018 1355 Last data filed at 10/12/2018 0727 Gross per 24 hour  Intake 563.82 ml  Output 3100 ml  Net -2536.18 ml    PHYSICAL EXAMINATION:  GENERAL:  85 y.o.-year-old patient lying in the bed with no acute distress.  EYES: Pupils equal, round, reactive to light and accommodation. No scleral icterus. Extraocular muscles intact.  HEENT: Head atraumatic, normocephalic. Oropharynx and nasopharynx clear.  NECK:  Supple, no jugular venous distention. No thyroid enlargement, no tenderness.  LUNGS: Normal breath sounds bilaterally, no wheezing, rales,rhonchi or crepitation. No use of accessory muscles of respiration.  CARDIOVASCULAR: S1, S2 normal. No murmurs, rubs, or gallops.  ABDOMEN: Soft, non-tender, non-distended. Bowel sounds present. No organomegaly or mass.  EXTREMITIES: No pedal edema, cyanosis, or clubbing.  NEUROLOGIC: Cranial nerves II through XII are intact. Muscle strength 5/5 in all extremities. Sensation intact. Gait not checked.  PSYCHIATRIC: The patient is alert and oriented x 3.  SKIN: No obvious rash, lesion, or ulcer.        Cardiac Enzymes No results for input(s): TROPONINI in  the last 168 hours. RADIOLOGY:  No results found.      DATA REVIEW:   CBC Recent Labs  Lab 10/10/18 0508  WBC 15.6*  HGB 8.1*  HCT 25.6*  PLT 405*    Chemistries  Recent Labs  Lab 10/12/18 0436  NA 136  K 4.4  CL 101  CO2 30  GLUCOSE 114*  BUN 16  CREATININE 0.58*  CALCIUM 8.5*  MG 2.0    Cardiac Enzymes No results for input(s): TROPONINI in the last 168 hours.  Microbiology Results  Results for orders placed or performed during the hospital encounter of 09/19/18  Blood culture (routine x 2)     Status: None   Collection Time: 09/19/18  3:35 PM   Specimen: BLOOD  Result Value Ref Range Status   Specimen Description BLOOD RIGHT ANTECUBITAL  Final   Special Requests   Final    BOTTLES DRAWN AEROBIC AND ANAEROBIC Blood Culture adequate volume   Culture   Final    NO GROWTH 5 DAYS Performed at [facility], [address], [place], [place] [address]    Report Status 09/24/2018 FINAL  Final  Urine Culture  Status: None   Collection Time: 09/19/18  3:35 PM   Specimen: Urine, Random  Result Value Ref Range Status   Specimen Description   Final    URINE, RANDOM Performed at [facility], [address], [place], [place] [address]    Special Requests   Final    Normal Performed at [facility], [address] [place], [place] [address]    Culture   Final    NO GROWTH Performed at [facility], [place] [address] [place], [place] [address]    Report Status 09/20/2018 FINAL  Final  Blood culture (routine x 2)     Status: Abnormal   Collection Time: 09/19/18  3:36 PM   Specimen: BLOOD  Result Value Ref Range Status   Specimen Description   Final    BLOOD LEFT ANTECUBITAL Performed at [facility], [address], [place], [place] [address]    Special Requests   Final    BOTTLES DRAWN AEROBIC AND ANAEROBIC Blood Culture adequate volume Performed at [facility], [address] [place],  [place] [address]    Culture  Setup Time   Final    AEROBIC BOTTLE ONLY GRAM POSITIVE RODS CRITICAL RESULT CALLED TO, READ BACK BY AND VERIFIED WITH: SHEEMA HALLAJI AT 1308 ON 09/20/2018 JJB    Culture (A)  Final    BACILLUS SPECIES NOT ANTHRACIS THE SIGNIFICANCE OF ISOLATING THIS ORGANISM FROM A SINGLE SET OF BLOOD CULTURES WHEN MULTIPLE SETS ARE DRAWN IS UNCERTAIN. PLEASE NOTIFY THE MICROBIOLOGY DEPARTMENT WITHIN ONE WEEK IF SPECIATION AND SENSITIVITIES ARE REQUIRED. Performed at [facility], [place] [address], [place], [place] [address]    Report Status 09/22/2018 FINAL  Final  SARS Coronavirus 2 (CEPHEID- Performed in [place] hospital lab), Hosp Order     Status: None   Collection Time: 09/19/18  3:36 PM   Specimen: Nasopharyngeal Swab  Result Value Ref Range Status   SARS Coronavirus 2 NEGATIVE NEGATIVE Final    Comment: (NOTE) If result is NEGATIVE SARS-CoV-2 target nucleic acids are NOT DETECTED. The SARS-CoV-2 RNA is generally detectable in upper and lower  respiratory specimens during the acute phase of infection. The lowest  concentration of SARS-CoV-2 viral copies this assay can detect is 250  copies / mL. A negative result does not preclude SARS-CoV-2 infection  and should not be used as the sole basis for treatment or other  patient management decisions.  A negative result may occur with  improper specimen collection / handling, submission of specimen other  than nasopharyngeal swab, presence of viral mutation(s) within the  areas targeted by this assay, and inadequate number of viral copies  (<250 copies / mL). A negative result must be combined with clinical  observations, patient history, and epidemiological information. If result is POSITIVE SARS-CoV-2 target nucleic acids are DETECTED. The SARS-CoV-2 RNA is generally detectable in upper and lower  respiratory specimens dur ing the acute phase of infection.  Positive  results are indicative of active infection  with SARS-CoV-2.  Clinical  correlation with patient history and other diagnostic information is  necessary to determine patient infection status.  Positive results do  not rule out bacterial infection or co-infection with other viruses. If result is PRESUMPTIVE POSTIVE SARS-CoV-2 nucleic acids MAY BE PRESENT.   A presumptive positive result was obtained on the submitted specimen  and confirmed on repeat testing.  While 2019 novel coronavirus  (SARS-CoV-2) nucleic acids may be present in the  submitted sample  additional confirmatory testing may be necessary for epidemiological  and / or clinical management purposes  to differentiate between  SARS-CoV-2 and other Sarbecovirus currently known to infect humans.  If clinically indicated additional testing with an alternate test  methodology [phone]) is advised. The SARS-CoV-2 RNA is generally  detectable in upper and lower respiratory sp ecimens during the acute  phase of infection. The expected result is Negative. Fact Sheet for Patients:  StrictlyIdeas.no Fact Sheet for Healthcare Providers: BankingDealers.co.za This test is not yet approved or cleared by the [place] FDA and has been authorized for detection and/or diagnosis of SARS-CoV-2 by FDA under an Emergency Use Authorization (EUA).  This EUA will remain in effect (meaning this test can be used) for the duration of the COVID-19 declaration under Section 564(b)(1) of the Act, 21 U.S.C. section 360bbb-3(b)(1), unless the authorization is terminated or revoked sooner. Performed at [facility], [address] [place], [place] [address]   Blood Culture ID Panel (Reflexed)     Status: None   Collection Time: 09/19/18  3:36 PM  Result Value Ref Range Status   Enterococcus species NOT DETECTED NOT DETECTED Final   Listeria monocytogenes NOT DETECTED NOT DETECTED Final   Staphylococcus species NOT DETECTED NOT DETECTED  Final   Staphylococcus aureus (BCID) NOT DETECTED NOT DETECTED Final   Streptococcus species NOT DETECTED NOT DETECTED Final   Streptococcus agalactiae NOT DETECTED NOT DETECTED Final   Streptococcus pneumoniae NOT DETECTED NOT DETECTED Final   Streptococcus pyogenes NOT DETECTED NOT DETECTED Final   Acinetobacter baumannii NOT DETECTED NOT DETECTED Final   Enterobacteriaceae species NOT DETECTED NOT DETECTED Final   Enterobacter cloacae complex NOT DETECTED NOT DETECTED Final   Escherichia coli NOT DETECTED NOT DETECTED Final   Klebsiella oxytoca NOT DETECTED NOT DETECTED Final   Klebsiella pneumoniae NOT DETECTED NOT DETECTED Final   Proteus species NOT DETECTED NOT DETECTED Final   Serratia marcescens NOT DETECTED NOT DETECTED Final   Haemophilus influenzae NOT DETECTED NOT DETECTED Final   Neisseria meningitidis NOT DETECTED NOT DETECTED Final   Pseudomonas aeruginosa NOT DETECTED NOT DETECTED Final   Candida albicans NOT DETECTED NOT DETECTED Final   Candida glabrata NOT DETECTED NOT DETECTED Final   Candida krusei NOT DETECTED NOT DETECTED Final   Candida parapsilosis NOT DETECTED NOT DETECTED Final   Candida tropicalis NOT DETECTED NOT DETECTED Final    Comment: Performed at [facility], [address] [place], [place] [address]  CULTURE, BLOOD (ROUTINE X 2) w Reflex to ID Panel     Status: None   Collection Time: 09/21/18  1:00 PM   Specimen: BLOOD  Result Value Ref Range Status   Specimen Description BLOOD LEFT ANTECUBITAL  Final   Special Requests   Final    BOTTLES DRAWN AEROBIC AND ANAEROBIC Blood Culture adequate volume   Culture   Final    NO GROWTH 5 DAYS Performed at [facility], [address] [place],  [address]    Report Status 09/26/2018 FINAL  Final  CULTURE, BLOOD (ROUTINE X 2) w Reflex to ID Panel     Status: None   Collection Time: 09/21/18  1:00 PM   Specimen: BLOOD  Result Value Ref Range Status   Specimen Description  BLOOD BLOOD RIGHT HAND  Final   Special Requests   Final    BOTTLES DRAWN AEROBIC ONLY Blood Culture results may not be optimal due to an inadequate volume of blood received  in culture bottles   Culture   Final    NO GROWTH 5 DAYS Performed at [facility], [address] [place], [place] [address]    Report Status 09/26/2018 FINAL  Final  SARS Coronavirus 2 (CEPHEID - Performed in [place] hospital lab), Hosp Order     Status: None   Collection Time: 09/30/18 11:42 PM   Specimen: Nasopharyngeal Swab  Result Value Ref Range Status   SARS Coronavirus 2 NEGATIVE NEGATIVE Final    Comment: (NOTE) If result is NEGATIVE SARS-CoV-2 target nucleic acids are NOT DETECTED. The SARS-CoV-2 RNA is generally detectable in upper and lower  respiratory specimens during the acute phase of infection. The lowest  concentration of SARS-CoV-2 viral copies this assay can detect is 250  copies / mL. A negative result does not preclude SARS-CoV-2 infection  and should not be used as the sole basis for treatment or other  patient management decisions.  A negative result may occur with  improper specimen collection / handling, submission of specimen other  than nasopharyngeal swab, presence of viral mutation(s) within the  areas targeted by this assay, and inadequate number of viral copies  (<250 copies / mL). A negative result must be combined with clinical  observations, patient history, and epidemiological information. If result is POSITIVE SARS-CoV-2 target nucleic acids are DETECTED. The SARS-CoV-2 RNA is generally detectable in upper and lower  respiratory specimens dur ing the acute phase of infection.  Positive  results are indicative of active infection with SARS-CoV-2.  Clinical  correlation with patient history and other diagnostic information is  necessary to determine patient infection status.  Positive results do  not rule out bacterial infection or co-infection with other  viruses. If result is PRESUMPTIVE POSTIVE SARS-CoV-2 nucleic acids MAY BE PRESENT.   A presumptive positive result was obtained on the submitted specimen  and confirmed on repeat testing.  While 2019 novel coronavirus  (SARS-CoV-2) nucleic acids may be present in the submitted sample  additional confirmatory testing may be necessary for epidemiological  and / or clinical management purposes  to differentiate between  SARS-CoV-2 and other Sarbecovirus currently known to infect humans.  If clinically indicated additional testing with an alternate test  methodology [phone]) is advised. The SARS-CoV-2 RNA is generally  detectable in upper and lower respiratory sp ecimens during the acute  phase of infection. The expected result is Negative. Fact Sheet for Patients:  StrictlyIdeas.no Fact Sheet for Healthcare Providers: BankingDealers.co.za This test is not yet approved or cleared by the [place] FDA and has been authorized for detection and/or diagnosis of SARS-CoV-2 by FDA under an Emergency Use Authorization (EUA).  This EUA will remain in effect (meaning this test can be used) for the duration of the COVID-19 declaration under Section 564(b)(1) of the Act, 21 U.S.C. section 360bbb-3(b)(1), unless the authorization is terminated or revoked sooner. Performed at [facility], [address] [place], [place] [address]   CULTURE, BLOOD (ROUTINE X 2) w Reflex to ID Panel     Status: None   Collection Time: 10/07/18  1:27 PM   Specimen: BLOOD  Result Value Ref Range Status   Specimen Description BLOOD RIGHT WRIST  Final   Special Requests   Final    BOTTLES DRAWN AEROBIC AND ANAEROBIC Blood Culture adequate volume   Culture   Final    NO GROWTH 5 DAYS Performed at [facility], [address] [place], [place] [address]    Report Status  10/12/2018 FINAL  Final  CULTURE, BLOOD (ROUTINE X 2) w Reflex to ID Panel      Status: None   Collection Time: 10/07/18  3:40 PM   Specimen: BLOOD RIGHT HAND  Result Value Ref Range Status   Specimen Description BLOOD RIGHT HAND  Final   Special Requests   Final    BOTTLES DRAWN AEROBIC AND ANAEROBIC Blood Culture adequate volume   Culture   Final    NO GROWTH 5 DAYS Performed at [facility] [address] [place], [place] [address]    Report Status 10/12/2018 FINAL  Final  Aerobic/Anaerobic Culture (surgical/deep wound)     Status: None (Preliminary result)   Collection Time: 10/09/18  3:11 AM   Specimen: Incision  Result Value Ref Range Status   Specimen Description   Final    INCISION PEG SITE Performed at [facility], [address], [place], [place] [address]    Special Requests   Final    NONE Performed at [facility], [address] [place], [place] [address]    Gram Stain   Final    RARE WBC PRESENT, PREDOMINANTLY PMN RARE GRAM POSITIVE COCCI Performed at [facility], [place] [address], [place], [place] [address]    Culture   Final    ABUNDANT STAPHYLOCOCCUS AUREUS ABUNDANT VIRIDANS STREPTOCOCCUS NO ANAEROBES ISOLATED; CULTURE IN PROGRESS FOR 5 DAYS    Report Status PENDING  Incomplete   Organism ID, Bacteria STAPHYLOCOCCUS AUREUS  Final      Susceptibility   Staphylococcus aureus - MIC*    CIPROFLOXACIN <=0.5 SENSITIVE Sensitive     ERYTHROMYCIN >=8 RESISTANT Resistant     GENTAMICIN <=0.5 SENSITIVE Sensitive     OXACILLIN 0.5 SENSITIVE Sensitive     TETRACYCLINE >=16 RESISTANT Resistant     VANCOMYCIN 1 SENSITIVE Sensitive     TRIMETH/SULFA <=10 SENSITIVE Sensitive     CLINDAMYCIN >=8 RESISTANT Resistant     RIFAMPIN <=0.5 SENSITIVE Sensitive     Inducible Clindamycin NEGATIVE Sensitive     * ABUNDANT STAPHYLOCOCCUS AUREUS    RADIOLOGY:  No results found.  EKG:   Orders placed or performed during the hospital encounter of 09/19/18   EKG 12-Lead   EKG 12-Lead   EKG 12-Lead   EKG  12-Lead   EKG 12-Lead   EKG 12-Lead   EKG 12-Lead   EKG 12-Lead      Management plans discussed with the patient, family and they are in agreement.  CODE STATUS:     Code Status Orders  (From admission, onward)         Start     Ordered   09/30/18 1344  Do not attempt resuscitation (DNR)  Continuous    Question Answer Comment  In the event of cardiac or respiratory ARREST Do not call a code blue   In the event of cardiac or respiratory ARREST Do not perform Intubation, CPR, defibrillation or ACLS   In the event of cardiac or respiratory ARREST Use medication by any route, position, wound care, and other measures to relive pain and suffering. May use oxygen, suction and manual treatment of airway obstruction as needed for comfort.      09/30/18 1343        Code Status History    Date Active Date Inactive Code Status Order ID Comments User Context   09/19/2018 2317 09/30/2018 1343 Full Code [id number]  Glenn Costa, MD Inpatient   09/19/2018 1812 09/19/2018 2316  DNR [id number]  Glenn Costa, MD ED   Advance Care Planning Activity    Advance Directive Documentation     Most Recent Value  Type of Advance Directive  Healthcare Power of Attorney  Pre-existing out of facility DNR order (yellow form or pink MOST form)  --  "MOST" Form in Place?  --      TOTAL TIME TAKING CARE OF THIS PATIENT: 45  minutes.   Note: This dictation was prepared with Dragon dictation along with smaller phrase technology. Any transcriptional errors that result from this process are unintentional.   @MEC @  on 10/12/2018 at 1:55 PM  Between 7am to 6pm - Pager - [phone]  After 6pm go to [email or web address] - password [email or web address] [facility]  Office  [phone]  CC: Primary care physician; Glenn Maple, MD

## 2018-10-12 NOTE — Consult Note (Signed)
PHARMACY CONSULT NOTE - FOLLOW UP  Pharmacy Consult for Electrolyte Monitoring and Replacement   Recent Labs: Potassium (mmol/L)  Date Value  10/12/2018 4.4   Magnesium (mg/dL)  Date Value  10/12/2018 2.0   Calcium (mg/dL)  Date Value  10/12/2018 8.5 (L)   Albumin (g/dL)  Date Value  10/03/2018 2.7 (L)   Phosphorus (mg/dL)  Date Value  10/12/2018 3.2   Sodium (mmol/L)  Date Value  10/12/2018 136  Corrected Ca: 9.3 mg/dL  Assessment: Patient admitted with hypokalemia. Pharmacy consulted for electrolyte monitoring and replacement. PEG placement on 7/6 and he started tube feeds yesterday. Feeding supplement: osmolite 1.5 @ goal rate of 48ml/hr. Patient is at risk for refeeding.    Goal of Therapy:  Electrolytes WNL  Plan:   No replacement needed at this time.   Will recheck labs every other day and continue to replace electrolytes as needed.   Oswald Hillock, PharmD, BCPS Clinical Pharmacist 10/12/2018 7:26 AM

## 2018-10-12 NOTE — Progress Notes (Signed)
ID Pt doing better Says he had the covid test now [facility]ing to [facility]  Patient Vitals for the past 24 hrs:  BP Temp Temp src Pulse Resp SpO2  10/12/18 0727 105/70 98.3 F (36.8 C) Oral 78 19 99 %  10/12/18 0435 124/69 (!) 97.5 F (36.4 C) Oral 80 20 98 %  10/11/18 1924 117/77 97.6 F (36.4 C) Oral 85 20 97 %  10/11/18 1713 117/72 98 F (36.7 C) Oral 84 20 99 %   Awake and alert Left face/scalp hematoma has almost resolved Abd soft - peg site looks better  CBC Latest Ref Rng & Units 10/10/2018 10/08/2018 10/07/2018  WBC 4.0 - 10.5 K/uL 15.6(H) 12.4(H) 11.4(H)  Hemoglobin 13.0 - 17.0 g/dL 8.1(L) 7.9(L) 8.0(L)  Hematocrit 39.0 - 52.0 % 25.6(L) 25.0(L) 25.3(L)  Platelets 150 - 400 K/uL 405(H) 364 370   CMP Latest Ref Rng & Units 10/12/2018 10/10/2018 10/09/2018  Glucose 70 - 99 mg/dL 114(H) 131(H) 123(H)  BUN 8 - 23 mg/dL 16 13 12   Creatinine 0.61 - 1.24 mg/dL 0.58(L) 0.60(L) 0.61  Sodium 135 - 145 mmol/L 136 133(L) 132(L)  Potassium 3.5 - 5.1 mmol/L 4.4 4.1 4.2  Chloride 98 - 111 mmol/L 101 102 99  CO2 22 - 32 mmol/L 30 26 25   Calcium 8.9 - 10.3 mg/dL 8.5(L) 8.2(L) 8.3(L)  Total Protein 6.5 - 8.1 g/dL - - -  Total Bilirubin 0.3 - 1.2 mg/dL - - -  Alkaline Phos 38 - 126 U/L - - -  AST 15 - 41 U/L - - -  ALT 0 - 44 U/L - - -    Impression/recommendation Prolonged hospitalization  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

## 2018-10-12 NOTE — Progress Notes (Signed)
Hematology/Oncology Progress Note [facility] Telephone:([phone] [phone]  Patient Care Team: [name], MD as PCP - General (Family Medicine)   Name of the patient: [name]  [id number]  1934/02/01  Date of visit: 10/12/18   INTERVAL HISTORY-  Patient is lying in the bed comfortably, watching TV. He denies any pain fever or chills. Yesterday received 1 dose of Venofer.  Tolerated well. No acute overnight events. He is currently on antibiotics due to PEG tube site infection. .     Review of systems- Review of Systems  Constitutional: Positive for fatigue No fever or chills.  Respiratory: Negative for chest tightness and cough.   Cardiovascular: Negative for chest pain.  Gastrointestinal: Denies any abdominal pain or nausea. Genitourinary: Negative for difficulty urinating.   Skin: Negative for rash.  Neurological: Denies any headache. Psychiatric/Behavioral: Negative for confusion.    No Known Allergies  Patient Active Problem List   Diagnosis Date Noted   Elevated PSA    Coagulopathy (HCC)    Pressure injury of skin 10/03/2018   Dysphagia    Anemia    Thrombocytopenia (HCC)    Blood in stool    Elevated transaminase level    Syncope 09/19/2018     Past Medical History:  Diagnosis Date   Arthritis    Cancer [facility])    Prostate Cancer   Hypertension      Past Surgical History:  Procedure Laterality Date   ESOPHAGOGASTRODUODENOSCOPY (EGD) WITH PROPOFOL N/A 12/06/2017   Procedure: ESOPHAGOGASTRODUODENOSCOPY (EGD) WITH PROPOFOL;  Surgeon: Jonathon Bellows, MD;  Location: [facility] ENDOSCOPY;  Service: Gastroenterology;  Laterality: N/A;   PEG PLACEMENT N/A 10/03/2018   Procedure: PERCUTANEOUS ENDOSCOPIC GASTROSTOMY (PEG) PLACEMENT;  Surgeon: Lucilla Lame, MD;  Location: [facility] ENDOSCOPY;  Service: Endoscopy;  Laterality: N/A;   PROSTATE CRYOABLATION      Social History   Socioeconomic History    Marital status: Widowed    Spouse name: Not on file   Number of children: Not on file   Years of education: Not on file   Highest education level: Not on file  Occupational History   Not on file  Social Needs   Financial resource strain: Not on file   Food insecurity    Worry: Not on file    Inability: Not on file   Transportation needs    Medical: Not on file    Non-medical: Not on file  Tobacco Use   Smoking status: Former Smoker    Types: Pipe    Quit date: 03/30/2002    Years since quitting: 16.5   Smokeless tobacco: Never Used  Substance and Sexual Activity   Alcohol use: Yes   Drug use: Never   Sexual activity: Not on file  Lifestyle   Physical activity    Days per week: Not on file    Minutes per session: Not on file   Stress: Not on file  Relationships   Social connections    Talks on phone: Not on file    Gets together: Not on file    Attends religious service: Not on file    Active member of club or organization: Not on file    Attends meetings of clubs or organizations: Not on file    Relationship status: Not on file   Intimate partner violence    Fear of current or ex partner: Not on file    Emotionally abused: Not on file    Physically abused: Not on file  Forced sexual activity: Not on file  Other Topics Concern   Not on file  Social History Narrative   Not on file     History reviewed. No pertinent family history.   Current Facility-Administered Medications:    0.9 %  sodium chloride infusion (Manually program via Guardrails IV Fluids), , Intravenous, Once, Wohl, Darren, MD   0.9 %  sodium chloride infusion, , Intravenous, PRN, Gouru, Aruna, MD, Stopped at 10/12/18 0108   0.9 %  sodium chloride infusion, , Intravenous, PRN, Gouru, Aruna, MD   acetaminophen (TYLENOL) tablet 650 mg, 650 mg, Oral, Q6H PRN, Mayo, Pete Pelt, MD, 650 mg at 10/08/18 1632   cephALEXin (KEFLEX) capsule 500 mg, 500 mg, Oral, Q6H, Gouru, Aruna, MD,  500 mg at 10/12/18 1230   feeding supplement (OSMOLITE 1.5 CAL) liquid 1,000 mL, 1,000 mL, Per Tube, Continuous, Mayo, Pete Pelt, MD, Last Rate: 55 mL/hr at 10/11/18 1356, 1,000 mL at 10/11/18 1356   free water 75 mL, 75 mL, Per Tube, Q4H, Mayo, Pete Pelt, MD, 75 mL at 10/12/18 1352   guaiFENesin-dextromethorphan (ROBITUSSIN DM) 100-10 MG/5ML syrup 5 mL, 5 mL, Oral, Q4H PRN, Mayo, Pete Pelt, MD, 5 mL at 10/05/18 2015   HYDROcodone-acetaminophen (NORCO/VICODIN) 5-325 MG per tablet 1 tablet, 1 tablet, Oral, Q6H PRN, Lucilla Lame, MD, 1 tablet at 10/10/18 0958   iron sucrose (VENOFER) 200 mg in sodium chloride 0.9 % 150 mL IVPB, 200 mg, Intravenous, Once, Earlie Server, MD   metoprolol tartrate (LOPRESSOR) tablet 25 mg, 25 mg, Per Tube, BID, Mayo, Pete Pelt, MD, 25 mg at 10/12/18 0930   morphine 2 MG/ML injection 1 mg, 1 mg, Intravenous, Q4H PRN, Lucilla Lame, MD, 1 mg at 10/09/18 2836   neomycin-bacitracin-polymyxin (NEOSPORIN) ointment packet, , Topical, Daily, Lucilla Lame, MD, 1 application at 62/94/76 0931   ondansetron (ZOFRAN) tablet 4 mg, 4 mg, Oral, Q6H PRN **OR** ondansetron (ZOFRAN) injection 4 mg, 4 mg, Intravenous, Q6H PRN, Allen Norris, Darren, MD   pantoprazole (PROTONIX) injection 40 mg, 40 mg, Intravenous, Q12H, Wohl, Darren, MD, 40 mg at 10/12/18 0930   polyethylene glycol (MIRALAX / GLYCOLAX) packet 17 g, 17 g, Oral, Daily PRN, Allen Norris, Darren, MD   sodium phosphate (FLEET) 7-19 GM/118ML enema 1 enema, 1 enema, Rectal, Once, Gouru, Aruna, MD   vitamin C (ASCORBIC ACID) tablet 250 mg, 250 mg, Per Tube, BID, Mayo, Pete Pelt, MD, 250 mg at 10/12/18 0930   Physical exam:  Vitals:   10/11/18 1713 10/11/18 1924 10/12/18 0435 10/12/18 0727  BP: 117/72 117/77 124/69 105/70  Pulse: 84 85 80 78  Resp: 20 20 20 19   Temp: 98 F (36.7 C) 97.6 F (36.4 C) (!) 97.5 F (36.4 C) 98.3 F (36.8 C)  TempSrc: Oral Oral Oral Oral  SpO2: 99% 97% 98% 99%  Weight:      Height:       Physical Exam   Constitutional: No distress.  Frail appearance, lying the bed  HENT:  Mouth/Throat: No oropharyngeal exudate.    Forehead bruising, resolving. Eyes: Pupils are equal, round, and reactive to light. EOM are normal. No scleral icterus.  Neck: Normal range of motion. Neck supple.  Cardiovascular: Normal rate and regular rhythm.  No murmur heard. Pulmonary/Chest: Effort normal. No respiratory distress.  Abdominal: Soft. He exhibits no distension.  PEG tube in place.    Musculoskeletal: Normal range of motion.        General: No edema.  Neurological: He is alert.    Speech  is close to baseline. Skin: Skin is warm and dry. No erythema.  Bruises  Psychiatric: Affect normal.       CMP Latest Ref Rng & Units 10/12/2018  Glucose 70 - 99 mg/dL 114(H)  BUN 8 - 23 mg/dL 16  Creatinine 0.61 - 1.24 mg/dL 0.58(L)  Sodium 135 - 145 mmol/L 136  Potassium 3.5 - 5.1 mmol/L 4.4  Chloride 98 - 111 mmol/L 101  CO2 22 - 32 mmol/L 30  Calcium 8.9 - 10.3 mg/dL 8.5(L)  Total Protein 6.5 - 8.1 g/dL -  Total Bilirubin 0.3 - 1.2 mg/dL -  Alkaline Phos 38 - 126 U/L -  AST 15 - 41 U/L -  ALT 0 - 44 U/L -   CBC Latest Ref Rng & Units 10/10/2018  WBC 4.0 - 10.5 K/uL 15.6(H)  Hemoglobin 13.0 - 17.0 g/dL 8.1(L)  Hematocrit 39.0 - 52.0 % 25.6(L)  Platelets 150 - 400 K/uL 405(H)    RADIOGRAPHIC STUDIES: I have personally reviewed the radiological images as listed and agreed with the findings in the report.  Ct Abdomen Wo Contrast  Result Date: 09/28/2018 CLINICAL DATA:  Dysphagia. Evaluate anatomy prior to potential percutaneous gastrostomy tube placement EXAM: CT ABDOMEN WITHOUT CONTRAST TECHNIQUE: Multidetector CT imaging of the abdomen was performed following the standard protocol without IV contrast. COMPARISON:  CT abdomen pelvis-01/20/2017; abdominal ultrasound-09/20/2018 FINDINGS: Lack of intravenous contrast limits the ability to evaluate solid abdominal organs. Lower chest: Limited visualization  of the lower thorax demonstrates small bilateral effusions with associated bibasilar consolidative opacities. Normal heart size. Coronary artery calcifications. Trace amount of pericardial fluid, presumably physiologic. There is diffuse decreased attenuation of the intra cardiac blood pool suggestive of anemia. Hepatobiliary: Normal hepatic contour. Scattered punctate granuloma are seen within the liver. Normal noncontrast appearance of the gallbladder given degree distention. No radiopaque gallstones. No ascites. Pancreas: Normal noncontrast appearance of the pancreas. Spleen: Punctate granuloma within otherwise normal-appearing spleen. Adrenals/Urinary Tract: Note is made of 3 nonobstructing left-sided renal stones with dominant nonobstructing stone within the interpolar aspect of the left kidney measuring 1.1 x 0.7 cm (coronal image 46, series 5). Note is made of a punctate (approximately 3 mm) nonobstructing stone within the inferior pole of the right kidney (coronal image 9, series 5). Previously characterized bilateral renal cysts are grossly unchanged with dominant partially exophytic cyst arising from the inferior pole of the right kidney measuring approximately 8.0 x 6.1 cm and is again noted to have thin peripheral mural calcifications, unchanged compared to contrast-enhanced CT scan performed 01/20/2017. No urinary obstruction. Normal noncontrast appearance of the bilateral adrenal glands. The urinary bladder was not imaged. Stomach/Bowel: The anterior wall of the stomach is well apposed against the ventral wall of the upper abdomen without interposed liver or transverse colon. No hiatal hernia. Ingested enteric contrast is seen within the colon. No definite evidence of enteric obstruction. No pneumoperitoneum, pneumatosis or portal venous gas. Vascular/Lymphatic: Atherosclerotic plaque within a normal caliber abdominal aorta. No bulky retroperitoneal, mesenteric, pelvic or inguinal lymphadenopathy on  this noncontrast examination. Other: Regional soft tissues are normal. Musculoskeletal: No acute or aggressive osseous abnormalities. Stigmata of DISH within the thoracic spine. IMPRESSION: 1. Gastric anatomy amenable to attempted percutaneous gastrostomy tube placement as indicated. 2. Small bilateral effusions with associated basilar opacities - atelectasis versus infiltrate. 3. Sequela of prior granulomatous infection as above. 4.  Aortic Atherosclerosis (ICD10-I70.0). Electronically Signed   By: Sandi Mariscal M.D.   On: 09/28/2018 16:48   Dg Chest 1 View  Result Date: 10/07/2018 CLINICAL DATA:  85 year old male with history of fever. Lightheadedness. Former smoker. EXAM: CHEST  1 VIEW COMPARISON:  Chest x-ray 10/04/2018. FINDINGS: Extensive skin fold artifact projecting over the lateral right hemithorax. Lung volumes are normal. No consolidative airspace disease. No pleural effusions. No pneumothorax. No pulmonary nodule or mass noted. Pulmonary vasculature and the cardiomediastinal silhouette are within normal limits. Aortic atherosclerosis. Old healed right-sided rib fractures are again incidentally noted. IMPRESSION: 1. No radiographic evidence of acute cardiopulmonary disease. 2. Aortic atherosclerosis. Electronically Signed   By: Vinnie Langton M.D.   On: 10/07/2018 08:34   Dg Chest 1 View  Result Date: 10/04/2018 CLINICAL DATA:  Cough EXAM: CHEST  1 VIEW COMPARISON:  09/19/2018 FINDINGS: Cardiac shadows within normal limits. Aortic calcifications are again seen. Old rib fractures are noted with healing on the right. Multiple skin folds are noted bilaterally. The lungs are well aerated without focal infiltrate or sizable effusion. No pneumothorax is noted. IMPRESSION: No acute abnormality noted. Electronically Signed   By: Inez Catalina M.D.   On: 10/04/2018 16:10   Dg Chest 1 View  Result Date: 09/19/2018 CLINICAL DATA:  85 year old male with weakness and fever. COVID-19 status pending. EXAM:  CHEST  1 VIEW COMPARISON:  CT Abdomen and Pelvis 01/20/2017. FINDINGS: Portable AP upright view at 1506 hours. Calcified aortic atherosclerosis. Other mediastinal contours are within normal limits. Visualized tracheal air column is within normal limits. Lung volumes are at the upper limits of normal. No pneumothorax, pleural effusion or pulmonary edema. No confluent pulmonary opacity. External button artifact suspected over the anterior 2nd rib on image #1. Chronic appearing posterior right rib fractures. Negative visible bowel gas pattern. IMPRESSION: No acute cardiopulmonary abnormality. Electronically Signed   By: Genevie Ann M.D.   On: 09/19/2018 15:39   Dg Pelvis 1-2 Views  Result Date: 09/20/2018 CLINICAL DATA:  Multiple falls.  Bilateral hip pain and weakness. EXAM: PELVIS - 1-2 VIEW COMPARISON:  CT 01/20/2017. FINDINGS: Diffuse osteopenia. Degenerative change both hips. No evidence of fracture or dislocation. A stable tiny sclerotic density noted in the left pubis consistent with a bone island. Pelvic calcifications consistent phleboliths. Peripheral vascular calcification. IMPRESSION: 1. Diffuse osteopenia. Degenerative change both hips. No acute bony abnormality. 2.  Peripheral vascular disease. Electronically Signed   By: Marcello Moores  Register   On: 09/20/2018 09:56   Ct Head Wo Contrast  Result Date: 10/05/2018 CLINICAL DATA:  Left subdural hemorrhage. EXAM: CT HEAD WITHOUT CONTRAST TECHNIQUE: Contiguous axial images were obtained from the base of the skull through the vertex without intravenous contrast. COMPARISON:  CT scan of September 21, 2018. FINDINGS: Brain: Mild diffuse cortical atrophy is noted. Mild chronic ischemic white matter disease is noted. Subdural hematoma noted over left cerebral cortex on prior exam is again noted and grossly stable in size. Maximum thickness is stable approximately 8 mm. There does appear to be slightly increased left tentorial and posterior left para falcine hematoma.  Ventricular size is within normal limits. Vascular: No hyperdense vessel or unexpected calcification. Skull: Normal. Negative for fracture or focal lesion. Sinuses/Orbits: No acute finding. Other: None. IMPRESSION: Subdural hematoma overlying left cerebral cortex noted on prior exam is again noted and grossly stable in size, although appears to be more heterogeneous in density compared to prior exam. There does appear to be slightly increased subdural hematoma involving the left posterior parafalcine region and tentorial region. No significant midline shift is noted currently. Electronically Signed   By: Bobbe Medico.D.  On: 10/05/2018 15:01   Ct Head Wo Contrast  Result Date: 09/21/2018 CLINICAL DATA:  Follow-up examination for subdural hemorrhage. EXAM: CT HEAD WITHOUT CONTRAST TECHNIQUE: Contiguous axial images were obtained from the base of the skull through the vertex without intravenous contrast. COMPARISON:  Previous brain MRI from earlier the same day. FINDINGS: Brain: Again seen is a left holo hemispheric subdural hematoma. This measures up to 8 mm in maximal dimension at the level of the left frontal lobe. Extension along the falx, with left parafalcine component measuring up to 6 mm. Mild mass effect with associated trace 2 mm left-to-right shift. Overall, appearance is stable from previous MRI. No other definite acute intracranial hemorrhage. No acute large vessel territory infarct. Previously identified punctate left frontal cortical infarct not seen by CT. Underlying atrophy with chronic microvascular ischemic disease noted. No mass lesion. No hydrocephalus. Vascular: No hyperdense vessel. Scattered vascular calcifications noted within the carotid siphons. Skull: Evolving left frontal scalp contusion/hematoma. Calvarium intact. Sinuses/Orbits: Globes and orbital soft tissues within normal limits. Paranasal sinuses and mastoid air cells are clear. Other: None. IMPRESSION: 1. Stable size and  appearance of left holo hemispheric subdural hematoma measuring up to 8 mm in maximal dimension. Associated trace 2 mm left-to-right shift. 2. Evolving left frontal scalp contusion/hematoma. 3. No other new acute intracranial process. Electronically Signed   By: Jeannine Boga M.D.   On: 09/21/2018 18:45   Ct Head Wo Contrast  Result Date: 09/19/2018 CLINICAL DATA:  Pt states he fell and hit his head on the bath tub today. Pt denies LOC EXAM: CT HEAD WITHOUT CONTRAST TECHNIQUE: Contiguous axial images were obtained from the base of the skull through the vertex without intravenous contrast. COMPARISON:  None. FINDINGS: Brain: No evidence of acute infarction, hemorrhage, extra-axial collection, ventriculomegaly, or mass effect. Generalized cerebral atrophy. Periventricular white matter low attenuation likely secondary to microangiopathy. Vascular: Cerebrovascular atherosclerotic calcifications are noted. Skull: Negative for fracture or focal lesion. Sinuses/Orbits: Visualized portions of the orbits are unremarkable. Visualized portions of the paranasal sinuses and mastoid air cells are unremarkable. Other: Large left frontal scalp hematoma. IMPRESSION: No acute intracranial pathology. Electronically Signed   By: Kathreen Devoid   On: 09/19/2018 15:42   Mr Brain Wo Contrast  Result Date: 09/21/2018 CLINICAL DATA:  Initial evaluation for acute speech difficulty, recent falls. EXAM: MRI HEAD WITHOUT CONTRAST TECHNIQUE: Multiplanar, multiecho pulse sequences of the brain and surrounding structures were obtained without intravenous contrast. COMPARISON:  Prior CT from 09/19/2018. FINDINGS: Brain: Generalized age-related cerebral atrophy with mild chronic small vessel ischemic disease. Remote lacunar infarct present at the right external capsule/lentiform nucleus. Small amount of associated chronic hemosiderin staining. There has been interval development of an acute subdural hematoma overlying the left cerebral  convexity. This measures up to 8 mm in maximal diameter. Extension along the falx. Associated trace 2 mm left-to-right shift. No hydrocephalus or ventricular trapping. Basilar cisterns remain patent. 4 mm focus of diffusion abnormality involving the left frontal cortex at the level of the operculum suspicious for a small acute ischemic infarct (series 7, image 22). No associated hemorrhage. No other evidence for acute or subacute ischemia. Gray-white matter differentiation otherwise maintained. Pituitary gland prominent with abnormal convex border superiorly (series 9, image 12). No other mass lesion. Midline structures intact. Vascular: Major intracranial vascular flow voids are maintained. Skull and upper cervical spine: Craniocervical junction within normal limits. Multilevel degenerative spondylolysis noted within the upper cervical spine without high-grade stenosis. Bone marrow signal intensity within normal  limits. Evolving soft tissue contusion/hematoma at the left frontal scalp. Sinuses/Orbits: Globes and orbital soft tissues within normal limits. Paranasal sinuses are clear. Small right mastoid effusion noted, of doubtful significance. Inner ear structures grossly normal. Other: None. IMPRESSION: 1. Delayed intracranial hemorrhage, with interval development of acute left subdural hematoma, measuring up to 8 mm in maximal thickness. Associated mild mass effect with trace 2 mm left-to-right shift. 2. 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 3. Underlying age-related cerebral atrophy with mild chronic microvascular ischemic disease. 4. Abnormal in prominent appearance of the pituitary gland, raising the possibility for an underlying pituitary lesion. Correlation with laboratory values recommended. Additionally, further assessment with dedicated pituitary protocol MRI suggested for further evaluation. This could be performed on a nonemergent outpatient  basis. Critical Value/emergent results were called by telephone at the time of interpretation on 09/21/2018 at 2:30 pm to Dr. Fritzi Mandes , who verbally acknowledged these results. Electronically Signed   By: Jeannine Boga M.D.   On: 09/21/2018 14:37   US Abdomen Complete  Result Date: 09/20/2018 CLINICAL DATA:  Elevated liver function tests. EXAM: ABDOMEN ULTRASOUND COMPLETE COMPARISON:  CT abdomen and pelvis 01/20/2017. FINDINGS: Gallbladder: No gallstones or wall thickening visualized. No sonographic Murphy sign noted by sonographer. Common bile duct: Diameter: 0.2 cm Liver: No focal lesion identified. Within normal limits in parenchymal echogenicity. Portal vein is patent on color Doppler imaging with normal direction of blood flow towards the liver. IVC: No abnormality visualized. Pancreas: Visualized portion unremarkable. Spleen: Size and appearance within normal limits. A few small calcifications in the spleen consistent with old granulomatous disease are seen as on prior CT. Right Kidney: Length: 12.5 cm. Echogenicity within normal limits. No solid mass or hydronephrosis visualized. 7.4 cm in diameter cyst is identified as seen on prior CT. Calcification in the wall of this cyst is unchanged. Left Kidney: Length: 12.5 cm. Echogenicity within normal limits. No solid mass or hydronephrosis visualized. Two simple cysts measuring 2.0 and 1.8 cm in diameter are unchanged. A 1.5 cm stone in the mid to lower pole is identified and was present on the prior CT. Abdominal aorta: No aneurysm visualized.  Atherosclerosis noted. Other findings: None. IMPRESSION: No acute abnormality.  Normal-appearing liver and gallbladder. Nonobstructing stone lower pole left kidney. Bilateral renal cysts. Atherosclerosis. Electronically Signed   By: Inge Rise M.D.   On: 09/20/2018 10:24   Dg Swallowing Func-speech Pathology  Result Date: 10/10/2018 Please refer to "Notes" tab for Speech Pathology notes.  Dg  Swallowing Func-speech Pathology  Result Date: 10/10/2018 Please refer to "Notes" tab for Speech Pathology notes.   Assessment and plan-  Patient is a 85 y.o. male history of hypertension, history of prostate cancer, presented for evaluation of multiple falls and generalized weakness.   #Anemia, iron deficiency. Tolerated 1 dose of IV  Venofer 200 mg x 1 yesterday.   No infusion reactions.   we will proceed with another dose L  with Benadryl as premed.   Follow-up outpatient for assessment for additional anemia work-up.  # History of prostate cancer, outpatient follow up. PSA elevated at 12.82.  Outpatient follow-up  #Intracranial hematoma/bleeding, secondary to injury.  PT/OT.  #Dysphagia, PEG tube infection.  Continue antibiotics managed by primary team.  Dr.Guru,  Thank you for allowing me to participate in the care of this patient.    Earlie Server, MD, PhD 10/12/2018

## 2018-10-12 NOTE — TOC Progression Note (Signed)
Transition of Care [facility]) - Progression Note    Patient Details  Name: RYSZARD SOCARRAS MRN: [id number] Date of Birth: [birth year]  Transition of Care [facility] CM/SW Contact  Elza Rafter, RN Phone Number: 10/12/2018, 3:02 PM  Clinical Narrative:   [facility] has notified this CM at 2:15 after EMS has been called for transport that they are unable to take patient because he is still on continuous tube feeds and their [place] representative is out sick.  He will need to switch to bolus feeds and tolerate those for 2 days.  They will be able to accept patient after he tolerates feeds.  Dr. Louretta Parma, RN and daughter [facility] aware.       Expected Discharge Plan: Skilled Nursing Facility Barriers to Discharge: No Barriers Identified  Expected Discharge Plan and Services Expected Discharge Plan: [place]   Discharge Planning Services: CM Consult     Expected Discharge Date: 10/12/18                                     Social Determinants of Health (SDOH) Interventions    Readmission Risk Interventions Readmission Risk Prevention Plan 09/21/2018  Post Dischage Appt Complete  Medication Screening Complete  Transportation Screening Complete  Some recent data might be hidden

## 2018-10-12 NOTE — Plan of Care (Signed)
Continuous Tube feeds still infusing with free water flushes, IV abx given Problem: Clinical Measurements: Goal: Respiratory complications will improve Outcome: Progressing   Problem: Nutrition: Goal: Adequate nutrition will be maintained Outcome: Progressing   Problem: Pain Managment: Goal: General experience of comfort will improve Outcome: Progressing Note: No complaints of pain this shift   Problem: Safety: Goal: Ability to remain free from injury will improve Outcome: Progressing

## 2018-10-13 LAB — GLUCOSE, CAPILLARY
Glucose-Capillary: 124 mg/dL — ABNORMAL HIGH (ref 70–99)
Glucose-Capillary: 117 mg/dL — ABNORMAL HIGH (ref 70–99)
Glucose-Capillary: 115 mg/dL — ABNORMAL HIGH (ref 70–99)

## 2018-10-13 MED ORDER — OSMOLITE 1.5 CAL PO LIQD
237.0000 mL | Freq: Every day | ORAL | Status: DC
  Administered 2018-10-13 – 2018-10-14 (×4): 237 mL

## 2018-10-13 NOTE — Consult Note (Signed)
PHARMACY CONSULT NOTE - FOLLOW UP  Pharmacy Consult for Electrolyte Monitoring and Replacement   Recent Labs: Potassium (mmol/L)  Date Value  10/12/2018 4.4   Magnesium (mg/dL)  Date Value  10/12/2018 2.0   Calcium (mg/dL)  Date Value  10/12/2018 8.5 (L)   Albumin (g/dL)  Date Value  10/03/2018 2.7 (L)   Phosphorus (mg/dL)  Date Value  10/12/2018 3.2   Sodium (mmol/L)  Date Value  10/12/2018 136  Corrected Ca: 9.3 mg/dL  Assessment: Patient admitted with hypokalemia. Pharmacy consulted for electrolyte monitoring and replacement. PEG placement on 7/6 and he started tube feeds yesterday. Feeding supplement: osmolite 1.5 @ goal rate of 69ml/hr. Patient is at risk for refeeding.    Goal of Therapy:  Electrolytes WNL  Plan:   No replacement needed at this time.   Will recheck labs every other day and continue to replace electrolytes as needed.   Oswald Hillock, PharmD, BCPS Clinical Pharmacist 10/13/2018 7:27 AM

## 2018-10-13 NOTE — Care Management Important Message (Signed)
Important Message  Patient Details  Name: NATALE THOMA MRN: [id number] Date of Birth: Sep 25, 1933   Medicare Important Message Given:  Yes     Dannette Barbara 10/13/2018, 12:53 PM

## 2018-10-13 NOTE — Progress Notes (Signed)
[facility] [place] NAME: [name]    MR#:  [id number]  DATE OF BIRTH:  October 08, 1933  SUBJECTIVE:   Patient is afebrile the past 24 hours.  PEG site is tender and some purulent discharge noticed  REVIEW OF SYSTEMS:   Review of Systems  Constitutional: Negative for chills, fever and weight loss.  HENT: Negative for ear discharge, ear pain and nosebleeds.   Eyes: Negative for blurred vision, pain and discharge.  Respiratory: Negative for sputum production, shortness of breath, wheezing and stridor.   Cardiovascular: Negative for chest pain, palpitations, orthopnea and PND.  Gastrointestinal: Negative for abdominal pain, diarrhea, melena, nausea and vomiting.  Genitourinary: Negative for frequency and urgency.  Musculoskeletal: Negative for back pain, falls and joint pain.  Neurological: Positive for weakness. Negative for sensory change, speech change and focal weakness.  Psychiatric/Behavioral: Negative for depression and hallucinations. The patient is not nervous/anxious.     DRUG ALLERGIES:  No Known Allergies  VITALS:  Blood pressure 115/75, pulse 79, temperature 97.9 F (36.6 C), temperature source Oral, resp. rate 18, height 6' (1.829 m), weight 60.5 kg, SpO2 98 %.  PHYSICAL EXAMINATION:   Physical Exam  GENERAL:  85 y.o.-year-old patient lying in the bed with no acute distress.  EYES: Pupils equal, round, reactive to light and accommodation. No scleral icterus. Extraocular muscles intact.  HEENT: Head atraumatic, normocephalic. Oropharynx and nasopharynx clear.  + Ecchymoses on his forehead NECK:  Supple, no jugular venous distention. No thyroid enlargement, no tenderness.  LUNGS: Normal breath sounds bilaterally, no wheezing, rales, rhonchi. No use of accessory muscles of respiration.  CARDIOVASCULAR: RRR, S1, S2 normal. No murmurs, rubs, or gallops.  ABDOMEN: Soft, nontender, nondistended. Bowel sounds  present. No organomegaly or mass.  EXTREMITIES: No cyanosis, clubbing or edema b/l.    NEUROLOGIC: Cranial nerves II through XII are intact. No focal Motor or sensory deficits b/l.  + Global weakness. PSYCHIATRIC:  patient is alert and oriented x 3.  SKIN: No obvious rash, lesion, or ulcer.   LABORATORY PANEL:  CBC Recent Labs  Lab 10/10/18 0508  WBC 15.6*  HGB 8.1*  HCT 25.6*  PLT 405*    Chemistries  Recent Labs  Lab 10/12/18 0436  NA 136  K 4.4  CL 101  CO2 30  GLUCOSE 114*  BUN 16  CREATININE 0.58*  CALCIUM 8.5*  MG 2.0   Cardiac Enzymes No results for input(s): TROPONINI in the last 168 hours. RADIOLOGY:  No results found. ASSESSMENT AND PLAN:   85 year old male with history of hypertension who has had decreased appetite for several weeks who presents after multiple falls today and syncopal episode.  Fever- from infected PEG site -Currently fever resolved.  Clinically improving - Wound culture done from the PEG site-revealing abundant Staphylococcus aureus.  Treated with cefepime and vancomycin initially subsequently cefepime discontinued and plan is to discharge patient with Keflex -Blood cultures no growth in 24 hours, urinalysis negative -Chest x-ray-no acute findings -Procalcitonin 0.58 -COVID neg 7/3 repeat culture test is negative from 10/12/2018 - ID consult placed. Discussed with Dr. Levester Fresh  Severe persistent dysphagia -Patient had MBS done -s/p PEG placement 7/6 -Continue tube feeds, changed them from continuous to bolus feeds as per nursing home's request.  Patient is tolerating well  anticipating to discharge patient tomorrow -Seen by palliative care, who recommended outpatient palliative f/u  Left subdural hematoma s/p fall at home with left frontal cortex CVA -Repeat  CT head 6/23 showed a stable hematoma -Holding aspirin and DVT prophylaxis -Patient has been evaluated by neurosurgery -CT head with possible pituitary lesion will  repeat MRI of the brain in 2 months regarding the same  Orthostatic hypotension-resolved with IV fluids -Continue IV fluids for today -Recheck orthostatic vitals in the morning -Recent ECHO with normal EF -Repeat CT head was stable -Holding home lisinopril  Tachycardia- resolved. 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 -Seen by GI-patient preferred outpatient colonoscopy -EGD 7/6 with a nonbleeding duodenal ulcer -Hematology recommending ferrous sulfate 325 mg daily  Urinary retention -Continue Foley  Hypertension- BPs soft -Holding home lisinopril  History of prostate cancer -Monitor  gen weakness- PT -SNF   TOTAL TIME TAKING CARE OF THIS PATIENT: 35 minutes.  >50% time spent on counselling and coordination of care   Note: This dictation was prepared with Dragon dictation along with smaller phrase technology. Any transcriptional errors that result from this process are unintentional.  Plan of care discussed with the patient's daughter Tera Partridge Naseem Adler M.D on 10/13/2018 at 2:36 PM  Between 7am to 6pm - Pager - [phone]  After 6pm go to [email or web address] - password [email or web address] [facility]  Office  [phone]  CC: Primary care physician; [name], MDPatient ID: [name], male   DOB: 03-02-1934, 85 y.o.   MRN: [id number]

## 2018-10-13 NOTE — TOC Progression Note (Addendum)
Transition of Care [facility]) - Progression Note    Patient Details  Name: [name] MRN: [id number] Date of Birth: [birth year]  Transition of Care [facility]) CM/SW Contact  Elza Rafter, RN Phone Number: 10/13/2018, 2:44 PM  Clinical Narrative:  Damaris Schooner with [name] at [facility] and she confirmed they can accept patient tomorow if he continues to tolerate feeds.  Will not need another COVID screen if less that 48hrs since the last.  Called and spoke with Sheri-daughter and let her know they will accept him tomorrow pending feed tolerance.  She is happy to hear this. Will continue to assist with discharge disposition.   Santiago Glad with outpatient palliative aware of probable discharge tomorrow.      Expected Discharge Plan: Skilled Nursing Facility Barriers to Discharge: No Barriers Identified  Expected Discharge Plan and Services Expected Discharge Plan: [place]   Discharge Planning Services: CM Consult     Expected Discharge Date: 10/12/18                                     Social Determinants of Health (SDOH) Interventions    Readmission Risk Interventions Readmission Risk Prevention Plan 09/21/2018  Post Dischage Appt Complete  Medication Screening Complete  Transportation Screening Complete  Some recent data might be hidden

## 2018-10-14 ENCOUNTER — Other Ambulatory Visit: Payer: Self-pay | Admitting: Oncology

## 2018-10-14 DIAGNOSIS — D509 Iron deficiency anemia, unspecified: Secondary | ICD-10-CM | POA: Insufficient documentation

## 2018-10-14 DIAGNOSIS — D508 Other iron deficiency anemias: Secondary | ICD-10-CM

## 2018-10-14 LAB — CBC
HCT: 26.9 % — ABNORMAL LOW (ref 39.0–52.0)
Hemoglobin: 8.5 g/dL — ABNORMAL LOW (ref 13.0–17.0)
MCH: 28 pg (ref 26.0–34.0)
MCHC: 31.6 g/dL (ref 30.0–36.0)
MCV: 88.5 fL (ref 80.0–100.0)
Platelets: 525 10*3/uL — ABNORMAL HIGH (ref 150–400)
RBC: 3.04 MIL/uL — ABNORMAL LOW (ref 4.22–5.81)
RDW: 14.1 % (ref 11.5–15.5)
WBC: 16 10*3/uL — ABNORMAL HIGH (ref 4.0–10.5)
nRBC: 0 % (ref 0.0–0.2)

## 2018-10-14 LAB — BASIC METABOLIC PANEL
Anion gap: 8 (ref 5–15)
BUN: 20 mg/dL (ref 8–23)
CO2: 26 mmol/L (ref 22–32)
Calcium: 8.7 mg/dL — ABNORMAL LOW (ref 8.9–10.3)
Chloride: 101 mmol/L (ref 98–111)
Creatinine, Ser: 0.65 mg/dL (ref 0.61–1.24)
GFR calc Af Amer: >60 mL/min (ref >60)
GFR calc non Af Amer: >60 mL/min (ref >60)
Glucose, Bld: 147 mg/dL — ABNORMAL HIGH (ref 70–99)
Potassium: 4.5 mmol/L (ref 3.5–5.1)
Sodium: 135 mmol/L (ref 135–145)

## 2018-10-14 LAB — GLUCOSE, CAPILLARY
Glucose-Capillary: 108 mg/dL — ABNORMAL HIGH (ref 70–99)
Glucose-Capillary: 129 mg/dL — ABNORMAL HIGH (ref 70–99)
Glucose-Capillary: 131 mg/dL — ABNORMAL HIGH (ref 70–99)

## 2018-10-14 MED ORDER — FLEET ENEMA 7-19 GM/118ML RE ENEM: 1.0000 | ENEMA | Freq: Every day | RECTAL | Status: DC | PRN

## 2018-10-14 MED ORDER — OSMOLITE 1.5 CAL PO LIQD: 237.0000 mL | Freq: Every day | ORAL | 3 refills | Status: AC

## 2018-10-14 MED ORDER — NYSTATIN 100000 UNIT/GM EX POWD: Freq: Three times a day (TID) | CUTANEOUS | 0 refills | Status: DC

## 2018-10-14 MED ORDER — NYSTATIN 100000 UNIT/GM EX POWD
Freq: Three times a day (TID) | CUTANEOUS | Status: DC
  Administered 2018-10-14: 11:00:00 via TOPICAL
  Filled 2018-10-14: qty 15

## 2018-10-14 MED ORDER — FREE WATER: 30.0000 mL | Freq: Every day | Status: AC

## 2018-10-14 NOTE — TOC Transition Note (Signed)
Transition of Care [facility]) - CM/SW Discharge Note   Patient Details  Name: [name] MRN: [id number] Date of Birth: February 03, 1934  [facility] of [facility]) CM/SW Contact:  Katrina Stack, RN Phone Number: 10/14/2018, 10:55 AM   Clinical Narrative:     Patient for discharge today to [facility]. To travel by EMS. Confirmed with [facility] at [facility] that have received tube feeding orders, discharge summary. Spoke with patient and his daughter regarding discharge.   Final next level of care: Skilled Nursing Facility Barriers to Discharge: No Barriers Identified   Patient Goals and CMS Choice Patient states their goals for this hospitalization and ongoing recovery are:: to fix this swallowing problem CMS Medicare.gov Compare Post Acute Care list provided to:: Patient Choice offered to / list presented to : Patient  Discharge Placement              Patient chooses bed at: [facility] [place] Patient to be transferred to facility by: [facility] Name of family member notified: Sheri Patient and family notified of of transfer: 10/19/18  Discharge Plan and Services   Discharge Planning Services: CM Consult                                 Social Determinants of Health (SDOH) Interventions     Readmission Risk Interventions Readmission Risk Prevention Plan 10/14/2018 09/21/2018  Post Dischage Appt - Complete  Medication Screening - Complete  Transportation Screening Complete Complete  PCP or Specialist Appt within 3-5 Days Complete -  HRI or [place] (No Data) -  Social Work Consult for [place] Planning/Counseling (No Data) -  Palliative Care Screening Complete -  Medication Review [employer]) Complete -  Some recent data might be hidden

## 2018-10-14 NOTE — Discharge Instructions (Signed)
Follow-up with primary care physician in 2 to 3 days Follow-up with urology in 10 days continue Foley catheter for urinary retention Follow-up with neurology as an outpatient for subdural hematoma Follow-up with gastroenterology for outpatient colonoscopy Continue foley

## 2018-10-14 NOTE — Discharge Summary (Addendum)
[facility] [place] NAME: [name]    MR#:  [id number]  DATE OF BIRTH:  06-May-1933  DATE OF ADMISSION:  09/19/2018 ADMITTING PHYSICIAN: Bettey Costa, MD  DATE OF DISCHARGE: 10/14/18   PRIMARY CARE PHYSICIAN: Glenn Maple, MD    ADMISSION DIAGNOSIS:  Syncope and collapse [R55] Hypokalemia [E87.6] Elevated troponin I level [R79.89] Minor head injury, initial encounter [S09.90XA] Syncope [R55]  DISCHARGE DIAGNOSIS:  Subdural hematoma Severe dysphagia status post PEG tube- strict NPO PEG site infection Acute urinary retention continue Foley catheter Tick bite completed the course of treatment Acute on chronic anemia SECONDARY DIAGNOSIS:   Past Medical History:  Diagnosis Date  . Arthritis   . Cancer [facility])    Prostate Cancer  . Hypertension     HOSPITAL COURSE:  [name]  is a 85 y.o. male with a known history of hypertension who presents today to the emergency room due to for falls today at home.  Patient reports for the past 3 days he has had dizziness and feelings of lightheadedness.  He denies chest pain, shortness of breath, cough, fever or urinary symptoms.  He was noted to have large amount of bruising on the left side of his forehead and skin tears.  His blood pressure was also noted to be low when EMS arrived.  He was given 250 cc normal saline bolus.   Patient does report since he has been secluded from friends he has decreased appetite.  He continues to take medication for hypertension.  Fever-  from infected PEG site -Currently fever resolved.  Clinically improving -  Wound culture done from the PEG site-revealing abundant Staphylococcus aureus.   Treated with cefepime and vancomycin initially subsequently cefepime discontinued and plan is to discharge patient with Keflex  -Blood cultures no growth in 24 hours, urinalysis negative -Chest x-ray-no acute findings -Procalcitonin 0.58 -COVID neg 7/3,  7/15   -  ID consult placed.  Discussed with Dr. Levester Fresh  Severe persistent dysphagia -Patient had MBS done -s/p PEG placement 7/6 -Continue tube feeds- boluses tolerated well by pt  -Seen by palliative care, who recommended outpatient palliative f/u  Left subdural hematoma s/p fall at home with left frontal cortex CVA -Repeat CT head 6/23 showed a stable hematoma -Holding aspirin and DVT prophylaxis -Patient has been evaluated by neurosurgery -CT head with possible pituitary lesion will repeat MRI of the brain in 2 months regarding the same  Orthostatic hypotension-resolved with IV fluids -Continue IV fluids for today -Recheck orthostatic vitals in the morning -Recent ECHO with normal EF -Repeat CT head was stable -Holding home lisinopril  Tachycardia- resolved. 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 -Seen by GI-patient preferred outpatient colonoscopy -EGD 7/6 with a nonbleeding duodenal ulcer -Hematology recommending ferrous sulfate 325 mg daily  Urinary retention -Continue Foley, op urology f/u   Hypertension- BPs soft -Holding home lisinopril  History of prostate cancer -Monitor  Stage I sacral decubitus ulcer-reposition patient Nystatin powder and butt cream  gen weakness- PT -SNF    DISCHARGE CONDITIONS:     CONSULTS OBTAINED:  Treatment Team:  Yolonda Kida, MD Earlie Server, MD Meade Maw, MD Tsosie Billing, MD   PROCEDURES PEG tube placement  DRUG ALLERGIES:  No  Known Allergies  DISCHARGE MEDICATIONS:   Allergies as of 10/14/2018   No Known Allergies     Medication List    STOP taking these medications   aspirin 325 MG EC tablet   Calcium 600+D 600-400  MG-UNIT tablet Generic drug: Calcium Carbonate-Vitamin D   lisinopril 40 MG tablet Commonly known as: ZESTRIL   Osteo Bi-Flex Adv Joint Shield Tabs     TAKE these medications   acetaminophen 325 MG tablet Commonly known as: TYLENOL Place 2 tablets (650 mg total) into feeding tube every 6 (six) hours as needed for mild pain.   ascorbic acid 250 MG tablet Commonly known as: VITAMIN C Place 1 tablet (250 mg total) into feeding tube 2 (two) times daily.   cephALEXin 500 MG capsule Commonly known as: KEFLEX Place 1 capsule (500 mg total) into feeding tube every 6 (six) hours.   feeding supplement (OSMOLITE 1.5 CAL) Liqd Place 237 mLs into feeding tube 6 (six) times daily.   free water Soln Place 75 mLs into feeding tube every 4 (four) hours.   free water Soln Place 30 mLs into feeding tube 6 (six) times daily.   guaiFENesin-dextromethorphan 100-10 MG/5ML syrup Commonly known as: ROBITUSSIN DM Place 5 mLs into feeding tube every 6 (six) hours as needed for cough.   HYDROcodone-acetaminophen 5-325 MG tablet Commonly known as: NORCO/VICODIN Place 1 tablet into feeding tube every 6 (six) hours as needed for moderate pain.   metoprolol tartrate 25 MG tablet Commonly known as: LOPRESSOR Place 1 tablet (25 mg total) into feeding tube 2 (two) times daily.   neomycin-bacitracin-polymyxin ointment Commonly known as: NEOSPORIN Apply topically daily.   nystatin powder Commonly known as: MYCOSTATIN/NYSTOP Apply topically 3 (three) times daily.   ondansetron 4 MG tablet Commonly known as: ZOFRAN Place 1 tablet (4 mg total) into feeding tube every 6 (six) hours as needed for nausea.   polyethylene glycol 17 g packet Commonly known as: MIRALAX / GLYCOLAX Place 17 g into feeding tube daily as needed for mild constipation.   vitamin B-12 500 MCG tablet Commonly known as: CYANOCOBALAMIN Place 1 tablet (500 mcg total) into feeding tube daily. What changed: how to take this         DISCHARGE INSTRUCTIONS:   Follow-up with primary care physician in 2 to 3 days Follow-up with urology in 10 days continue Foley catheter for urinary retention Follow-up with neurology as an outpatient for subdural hematoma Follow-up with gastroenterology for outpatient colonoscopy  DIET:  NPO ,PEG feeds  DISCHARGE CONDITION:  Fair  ACTIVITY:  Activity as tolerated per PT  OXYGEN:  Home Oxygen: No.   Oxygen Delivery: room air  DISCHARGE LOCATION:  nursing home   If you experience worsening of your admission symptoms, develop shortness of breath, life threatening emergency, suicidal or homicidal thoughts you must seek medical attention immediately by calling 911 or calling your MD immediately  if symptoms less severe.  You Must read complete instructions/literature along with all the possible adverse reactions/side effects for all the Medicines you take and that have been prescribed to you. Take any new Medicines after you have completely understood and accpet all the possible adverse reactions/side effects.   Please note  You were cared for by a hospitalist during your hospital stay. If you have any questions about your discharge medications or the care you received while you were in the hospital after you are discharged, you can call the unit and asked to speak with the hospitalist on call  if the hospitalist that took care of you is not available. Once you are discharged, your primary care physician will handle any further medical issues. Please note that NO REFILLS for any discharge medications will be authorized once you are discharged, as it is imperative that you return to your primary care physician (or establish a relationship with a primary care physician if you do not have one) for your aftercare needs so that they can reassess your need for medications and monitor your lab values.     Today  Chief Complaint  Patient presents with  . Fall   Patient is feeling  fine.  Denies any complaints.  Resting comfortably no overnight incidents  ROS:  CONSTITUTIONAL: Denies fevers, chills. Denies any fatigue, weakness.  EYES: Denies blurry vision, double vision, eye pain. EARS, NOSE, THROAT: Denies tinnitus, ear pain, hearing loss. RESPIRATORY: Denies cough, wheeze, shortness of breath.  CARDIOVASCULAR: Denies chest pain, palpitations, edema.  GASTROINTESTINAL: Denies nausea, vomiting, diarrhea, abdominal pain. Denies bright red blood per rectum. GENITOURINARY: Denies dysuria, hematuria. ENDOCRINE: Denies nocturia or thyroid problems. HEMATOLOGIC AND LYMPHATIC: Denies easy bruising or bleeding. SKIN: Denies rash or lesion. MUSCULOSKELETAL: Denies pain in neck, back, shoulder, knees, hips or arthritic symptoms.  NEUROLOGIC: Denies paralysis, paresthesias.  PSYCHIATRIC: Denies anxiety or depressive symptoms.   VITAL SIGNS:  Blood pressure 109/70, pulse 85, temperature 98 F (36.7 C), temperature source Oral, resp. rate 20, height 6' (1.829 m), weight 53 kg, SpO2 97 %.  I/O:    Intake/Output Summary (Last 24 hours) at 10/14/2018 1051 Last data filed at 10/14/2018 0900 Gross per 24 hour  Intake 714 ml  Output 1500 ml  Net -786 ml    PHYSICAL EXAMINATION:  GENERAL:  85 y.o.-year-old patient lying in the bed with no acute distress.  EYES: Pupils equal, round, reactive to light and accommodation. No scleral icterus. Extraocular muscles intact.  HEENT: Head atraumatic, normocephalic. Oropharynx and nasopharynx clear.  NECK:  Supple, no jugular venous distention. No thyroid enlargement, no tenderness.  LUNGS: Normal breath sounds bilaterally, no wheezing, rales,rhonchi or crepitation. No use of accessory muscles of respiration.  CARDIOVASCULAR: S1, S2 normal. No murmurs, rubs, or gallops.  ABDOMEN: Soft, non-tender, non-distended. Bowel sounds present. No organomegaly or mass.  EXTREMITIES: No pedal edema, cyanosis, or clubbing.  NEUROLOGIC: Cranial  nerves II through XII are intact. Muscle strength 5/5 in all extremities. Sensation intact. Gait not checked.  PSYCHIATRIC: The patient is alert and oriented x 3.  SKIN: No obvious rash, lesion, or ulcer.             Cardiac Enzymes No results for input(s): TROPONINI in the last 168 hours. RADIOLOGY:  No results found.      DATA REVIEW:   CBC Recent Labs  Lab 10/14/18 0814  WBC 16.0*  HGB 8.5*  HCT 26.9*  PLT 525*    Chemistries  Recent Labs  Lab 10/12/18 0436 10/14/18 0814  NA 136 135  K 4.4 4.5  CL 101 101  CO2 30 26  GLUCOSE 114* 147*  BUN 16 20  CREATININE 0.58* 0.65  CALCIUM 8.5* 8.7*  MG 2.0  --     Cardiac Enzymes No results for input(s): TROPONINI in the last 168 hours.  Microbiology Results  Results for orders placed or performed during the hospital encounter of 09/19/18  Blood culture (routine x 2)     Status: None   Collection Time: 09/19/18  3:35 PM   Specimen: BLOOD  Result Value Ref Range  Status   Specimen Description BLOOD RIGHT ANTECUBITAL  Final   Special Requests   Final    BOTTLES DRAWN AEROBIC AND ANAEROBIC Blood Culture adequate volume   Culture   Final    NO GROWTH 5 DAYS Performed at [facility], [address], [place], [place] [address]    Report Status 09/24/2018 FINAL  Final  Urine Culture     Status: None   Collection Time: 09/19/18  3:35 PM   Specimen: Urine, Random  Result Value Ref Range Status   Specimen Description   Final    URINE, RANDOM Performed at [facility], [address] [place], [place] [address]    Special Requests   Final    Normal Performed at [facility], [address], [place], [place] [address]    Culture   Final    NO GROWTH Performed at [facility], [place] [address], [place], [place] [address]    Report Status 09/20/2018 FINAL  Final  Blood culture (routine x 2)     Status: Abnormal   Collection Time: 09/19/18  3:36 PM   Specimen: BLOOD   Result Value Ref Range Status   Specimen Description   Final    BLOOD LEFT ANTECUBITAL Performed at [facility], [address], [place], [place] [address]    Special Requests   Final    BOTTLES DRAWN AEROBIC AND ANAEROBIC Blood Culture adequate volume Performed at [facility], [address] [place], [place] [address]    Culture  Setup Time   Final    AEROBIC BOTTLE ONLY GRAM POSITIVE RODS CRITICAL RESULT CALLED TO, READ BACK BY AND VERIFIED WITH: SHEEMA HALLAJI AT 5784 ON 09/20/2018 JJB    Culture (A)  Final    BACILLUS SPECIES NOT ANTHRACIS THE SIGNIFICANCE OF ISOLATING THIS ORGANISM FROM A SINGLE SET OF BLOOD CULTURES WHEN MULTIPLE SETS ARE DRAWN IS UNCERTAIN. PLEASE NOTIFY THE MICROBIOLOGY DEPARTMENT WITHIN ONE WEEK IF SPECIATION AND SENSITIVITIES ARE REQUIRED. Performed at [facility], [place] [address] [place], [place] [address]    Report Status 09/22/2018 FINAL  Final  SARS Coronavirus 2 (CEPHEID- Performed in [facility] hospital lab), Hosp Order     Status: None   Collection Time: 09/19/18  3:36 PM   Specimen: Nasopharyngeal Swab  Result Value Ref Range Status   SARS Coronavirus 2 NEGATIVE NEGATIVE Final    Comment: (NOTE) If result is NEGATIVE SARS-CoV-2 target nucleic acids are NOT DETECTED. The SARS-CoV-2 RNA is generally detectable in upper and lower  respiratory specimens during the acute phase of infection. The lowest  concentration of SARS-CoV-2 viral copies this assay can detect is 250  copies / mL. A negative result does not preclude SARS-CoV-2 infection  and should not be used as the sole basis for treatment or other  patient management decisions.  A negative result may occur with  improper specimen collection / handling, submission of specimen other  than nasopharyngeal swab, presence of viral mutation(s) within the  areas targeted by this assay, and inadequate number of viral copies  (<250 copies / mL). A negative result must be  combined with clinical  observations, patient history, and epidemiological information. If result is POSITIVE SARS-CoV-2 target nucleic acids are DETECTED. The SARS-CoV-2 RNA is generally detectable in upper and lower  respiratory specimens dur ing the acute phase of infection.  Positive  results are indicative of active infection with SARS-CoV-2.  Clinical  correlation with patient history  and other diagnostic information is  necessary to determine patient infection status.  Positive results do  not rule out bacterial infection or co-infection with other viruses. If result is PRESUMPTIVE POSTIVE SARS-CoV-2 nucleic acids MAY BE PRESENT.   A presumptive positive result was obtained on the submitted specimen  and confirmed on repeat testing.  While 2019 novel coronavirus  (SARS-CoV-2) nucleic acids may be present in the submitted sample  additional confirmatory testing may be necessary for epidemiological  and / or clinical management purposes  to differentiate between  SARS-CoV-2 and other Sarbecovirus currently known to infect humans.  If clinically indicated additional testing with an alternate test  methodology [phone]) is advised. The SARS-CoV-2 RNA is generally  detectable in upper and lower respiratory sp ecimens during the acute  phase of infection. The expected result is Negative. Fact Sheet for Patients:  StrictlyIdeas.no Fact Sheet for Healthcare Providers: BankingDealers.co.za This test is not yet approved or cleared by the [place] FDA and has been authorized for detection and/or diagnosis of SARS-CoV-2 by FDA under an Emergency Use Authorization (EUA).  This EUA will remain in effect (meaning this test can be used) for the duration of the COVID-19 declaration under Section 564(b)(1) of the Act, 21 U.S.C. section 360bbb-3(b)(1), unless the authorization is terminated or revoked sooner. Performed at [facility], [address] [place], [place] [address]   Blood Culture ID Panel (Reflexed)     Status: None   Collection Time: 09/19/18  3:36 PM  Result Value Ref Range Status   Enterococcus species NOT DETECTED NOT DETECTED Final   Listeria monocytogenes NOT DETECTED NOT DETECTED Final   Staphylococcus species NOT DETECTED NOT DETECTED Final   Staphylococcus aureus (BCID) NOT DETECTED NOT DETECTED Final   Streptococcus species NOT DETECTED NOT DETECTED Final   Streptococcus agalactiae NOT DETECTED NOT DETECTED Final   Streptococcus pneumoniae NOT DETECTED NOT DETECTED Final   Streptococcus pyogenes NOT DETECTED NOT DETECTED Final   Acinetobacter baumannii NOT DETECTED NOT DETECTED Final   Enterobacteriaceae species NOT DETECTED NOT DETECTED Final   Enterobacter cloacae complex NOT DETECTED NOT DETECTED Final   Escherichia coli NOT DETECTED NOT DETECTED Final   Klebsiella oxytoca NOT DETECTED NOT DETECTED Final   Klebsiella pneumoniae NOT DETECTED NOT DETECTED Final   Proteus species NOT DETECTED NOT DETECTED Final   Serratia marcescens NOT DETECTED NOT DETECTED Final   Haemophilus influenzae NOT DETECTED NOT DETECTED Final   Neisseria meningitidis NOT DETECTED NOT DETECTED Final   Pseudomonas aeruginosa NOT DETECTED NOT DETECTED Final   Candida albicans NOT DETECTED NOT DETECTED Final   Candida glabrata NOT DETECTED NOT DETECTED Final   Candida krusei NOT DETECTED NOT DETECTED Final   Candida parapsilosis NOT DETECTED NOT DETECTED Final   Candida tropicalis NOT DETECTED NOT DETECTED Final    Comment: Performed at [facility], [address] [place], [place] [address]  CULTURE, BLOOD (ROUTINE X 2) w Reflex to ID Panel     Status: None   Collection Time: 09/21/18  1:00 PM   Specimen: BLOOD  Result Value Ref Range Status   Specimen Description BLOOD LEFT ANTECUBITAL  Final   Special Requests   Final    BOTTLES DRAWN AEROBIC AND ANAEROBIC Blood Culture adequate volume    Culture   Final    NO GROWTH 5 DAYS Performed at [facility], [address] [place], [place] [address]    Report Status 09/26/2018 FINAL  Final  CULTURE, BLOOD (ROUTINE  X 2) w Reflex to ID Panel     Status: None   Collection Time: 09/21/18  1:00 PM   Specimen: BLOOD  Result Value Ref Range Status   Specimen Description BLOOD BLOOD RIGHT HAND  Final   Special Requests   Final    BOTTLES DRAWN AEROBIC ONLY Blood Culture results may not be optimal due to an inadequate volume of blood received in culture bottles   Culture   Final    NO GROWTH 5 DAYS Performed at [facility], [address], [place], [place] [address]    Report Status 09/26/2018 FINAL  Final  SARS Coronavirus 2 (CEPHEID - Performed in [facility] hospital lab), Hosp Order     Status: None   Collection Time: 09/30/18 11:42 PM   Specimen: Nasopharyngeal Swab  Result Value Ref Range Status   SARS Coronavirus 2 NEGATIVE NEGATIVE Final    Comment: (NOTE) If result is NEGATIVE SARS-CoV-2 target nucleic acids are NOT DETECTED. The SARS-CoV-2 RNA is generally detectable in upper and lower  respiratory specimens during the acute phase of infection. The lowest  concentration of SARS-CoV-2 viral copies this assay can detect is 250  copies / mL. A negative result does not preclude SARS-CoV-2 infection  and should not be used as the sole basis for treatment or other  patient management decisions.  A negative result may occur with  improper specimen collection / handling, submission of specimen other  than nasopharyngeal swab, presence of viral mutation(s) within the  areas targeted by this assay, and inadequate number of viral copies  (<250 copies / mL). A negative result must be combined with clinical  observations, patient history, and epidemiological information. If result is POSITIVE SARS-CoV-2 target nucleic acids are DETECTED. The SARS-CoV-2 RNA is generally detectable in upper and lower   respiratory specimens dur ing the acute phase of infection.  Positive  results are indicative of active infection with SARS-CoV-2.  Clinical  correlation with patient history and other diagnostic information is  necessary to determine patient infection status.  Positive results do  not rule out bacterial infection or co-infection with other viruses. If result is PRESUMPTIVE POSTIVE SARS-CoV-2 nucleic acids MAY BE PRESENT.   A presumptive positive result was obtained on the submitted specimen  and confirmed on repeat testing.  While 2019 novel coronavirus  (SARS-CoV-2) nucleic acids may be present in the submitted sample  additional confirmatory testing may be necessary for epidemiological  and / or clinical management purposes  to differentiate between  SARS-CoV-2 and other Sarbecovirus currently known to infect humans.  If clinically indicated additional testing with an alternate test  methodology [phone]) is advised. The SARS-CoV-2 RNA is generally  detectable in upper and lower respiratory sp ecimens during the acute  phase of infection. The expected result is Negative. Fact Sheet for Patients:  StrictlyIdeas.no Fact Sheet for Healthcare Providers: BankingDealers.co.za This test is not yet approved or cleared by the [place] FDA and has been authorized for detection and/or diagnosis of SARS-CoV-2 by FDA under an Emergency Use Authorization (EUA).  This EUA will remain in effect (meaning this test can be used) for the duration of the COVID-19 declaration under Section 564(b)(1) of the Act, 21 U.S.C. section 360bbb-3(b)(1), unless the authorization is terminated or revoked sooner. Performed at [facility], [place], [place] [address]   CULTURE, BLOOD (ROUTINE X 2) w Reflex to ID Panel     Status: None   Collection Time: 10/07/18  1:27 PM   Specimen: BLOOD  Result Value Ref Range Status   Specimen  Description BLOOD RIGHT WRIST  Final   Special Requests   Final    BOTTLES DRAWN AEROBIC AND ANAEROBIC Blood Culture adequate volume   Culture   Final    NO GROWTH 5 DAYS Performed at [facility], [address] [place], [place] [address]    Report Status 10/12/2018 FINAL  Final  CULTURE, BLOOD (ROUTINE X 2) w Reflex to ID Panel     Status: None   Collection Time: 10/07/18  3:40 PM   Specimen: BLOOD RIGHT HAND  Result Value Ref Range Status   Specimen Description BLOOD RIGHT HAND  Final   Special Requests   Final    BOTTLES DRAWN AEROBIC AND ANAEROBIC Blood Culture adequate volume   Culture   Final    NO GROWTH 5 DAYS Performed at [facility], [address], [place], [place] [address]    Report Status 10/12/2018 FINAL  Final  Aerobic/Anaerobic Culture (surgical/deep wound)     Status: None (Preliminary result)   Collection Time: 10/09/18  3:11 AM   Specimen: Incision  Result Value Ref Range Status   Specimen Description   Final    INCISION PEG SITE Performed at [facility], [address], [place], [place] [address]    Special Requests   Final    NONE Performed at [facility], [address] [place], [place] [address]    Gram Stain   Final    RARE WBC PRESENT, PREDOMINANTLY PMN RARE GRAM POSITIVE COCCI Performed at [facility], [place] [address] [place], [place] [address]    Culture   Final    ABUNDANT STAPHYLOCOCCUS AUREUS ABUNDANT VIRIDANS STREPTOCOCCUS NO ANAEROBES ISOLATED; CULTURE IN PROGRESS FOR 5 DAYS    Report Status PENDING  Incomplete   Organism ID, Bacteria STAPHYLOCOCCUS AUREUS  Final      Susceptibility   Staphylococcus aureus - MIC*    CIPROFLOXACIN <=0.5 SENSITIVE Sensitive     ERYTHROMYCIN >=8 RESISTANT Resistant     GENTAMICIN <=0.5 SENSITIVE Sensitive     OXACILLIN 0.5 SENSITIVE Sensitive     TETRACYCLINE >=16 RESISTANT Resistant     VANCOMYCIN 1 SENSITIVE Sensitive     TRIMETH/SULFA <=10  SENSITIVE Sensitive     CLINDAMYCIN >=8 RESISTANT Resistant     RIFAMPIN <=0.5 SENSITIVE Sensitive     Inducible Clindamycin NEGATIVE Sensitive     * ABUNDANT STAPHYLOCOCCUS AUREUS  SARS Coronavirus 2 (CEPHEID - Performed in [place] hospital lab), Hosp Order     Status: None   Collection Time: 10/12/18 12:48 PM   Specimen: Nasopharyngeal Swab  Result Value Ref Range Status   SARS Coronavirus 2 NEGATIVE NEGATIVE Final    Comment: (NOTE) If result is NEGATIVE SARS-CoV-2 target nucleic acids are NOT DETECTED. The SARS-CoV-2 RNA is generally detectable in upper and lower  respiratory specimens during the acute phase of infection. The lowest  concentration of SARS-CoV-2 viral copies this assay can detect is 250  copies / mL. A negative result does not preclude SARS-CoV-2 infection  and should not be used as the sole basis for treatment or other  patient management decisions.  A negative result may occur with  improper specimen collection / handling, submission of specimen other  than nasopharyngeal swab, presence of viral mutation(s) within the  areas targeted by this assay, and inadequate number of viral copies  (<250 copies / mL). A negative  result must be combined with clinical  observations, patient history, and epidemiological information. If result is POSITIVE SARS-CoV-2 target nucleic acids are DETECTED. The SARS-CoV-2 RNA is generally detectable in upper and lower  respiratory specimens dur ing the acute phase of infection.  Positive  results are indicative of active infection with SARS-CoV-2.  Clinical  correlation with patient history and other diagnostic information is  necessary to determine patient infection status.  Positive results do  not rule out bacterial infection or co-infection with other viruses. If result is PRESUMPTIVE POSTIVE SARS-CoV-2 nucleic acids MAY BE PRESENT.   A presumptive positive result was obtained on the submitted specimen  and confirmed on  repeat testing.  While 2019 novel coronavirus  (SARS-CoV-2) nucleic acids may be present in the submitted sample  additional confirmatory testing may be necessary for epidemiological  and / or clinical management purposes  to differentiate between  SARS-CoV-2 and other Sarbecovirus currently known to infect humans.  If clinically indicated additional testing with an alternate test  methodology [phone]) is advised. The SARS-CoV-2 RNA is generally  detectable in upper and lower respiratory sp ecimens during the acute  phase of infection. The expected result is Negative. Fact Sheet for Patients:  StrictlyIdeas.no Fact Sheet for Healthcare Providers: BankingDealers.co.za This test is not yet approved or cleared by the [place] FDA and has been authorized for detection and/or diagnosis of SARS-CoV-2 by FDA under an Emergency Use Authorization (EUA).  This EUA will remain in effect (meaning this test can be used) for the duration of the COVID-19 declaration under Section 564(b)(1) of the Act, 21 U.S.C. section 360bbb-3(b)(1), unless the authorization is terminated or revoked sooner. Performed at [facility], [address] [place], [place] [address]     RADIOLOGY:  No results found.  EKG:   Orders placed or performed during the hospital encounter of 09/19/18  . EKG 12-Lead  . EKG 12-Lead  . EKG 12-Lead  . EKG 12-Lead  . EKG 12-Lead  . EKG 12-Lead  . EKG 12-Lead  . EKG 12-Lead      Management plans discussed with the patient, family and they are in agreement.  CODE STATUS:     Code Status Orders  (From admission, onward)         Start     Ordered   09/30/18 1344  Do not attempt resuscitation (DNR)  Continuous    Question Answer Comment  In the event of cardiac or respiratory ARREST Do not call a "code blue"   In the event of cardiac or respiratory ARREST Do not perform Intubation, CPR, defibrillation or  ACLS   In the event of cardiac or respiratory ARREST Use medication by any route, position, wound care, and other measures to relive pain and suffering. May use oxygen, suction and manual treatment of airway obstruction as needed for comfort.      09/30/18 1343        Code Status History    Date Active Date Inactive Code Status Order ID Comments User Context   09/19/2018 2317 09/30/2018 1343 Full Code [id number]  Bettey Costa, MD Inpatient   09/19/2018 1812 09/19/2018 2316 DNR [id number]  Bettey Costa, MD ED   Advance Care Planning Activity    Advance Directive Documentation     Most Recent Value  Type of Advance Directive  Healthcare Power of Attorney  Pre-existing out of facility DNR order (yellow form or pink MOST form)  -  "MOST" Form in Place?  -  TOTAL TIME TAKING CARE OF THIS PATIENT: 45  minutes.   Note: This dictation was prepared with Dragon dictation along with smaller phrase technology. Any transcriptional errors that result from this process are unintentional.   @MEC @  on 10/14/2018 at 10:51 AM  Between 7am to 6pm - Pager - [phone]  After 6pm go to [email or web address] - password [email or web address] [facility]  Office  [phone]  CC: Primary care physician; Glenn Maple, MD

## 2018-10-14 NOTE — Progress Notes (Signed)
Report called to [facility]. Non-emergent transport also contacted, EMS.

## 2018-10-14 NOTE — Progress Notes (Signed)
Nutrition Brief Note   Home tube feed regimen for [name]  Osmolite 1.5- Total of 6 cans daily-  Administer 1 can (254ml) via PEG tube 6 times daily (0600, 0900, 1200, 1500, 1800, 9100).  Free water flushes 23ml before and after each tube feeding   Regimen provides 2130kcal/day, 89g/day protein, 1455ml/day free water   Vitamin C 250mg  BID via tube  Estimated Nutritional Needs:   Kcal:  1700-2000kcal/day  Protein:  85-100g/day  Fluid:  >1.5L/day  Koleen Distance MS, RD, LDN Pager #- [phone] Office#- [phone] After Hours Pager: [phone]

## 2018-10-14 NOTE — Consult Note (Signed)
PHARMACY CONSULT NOTE - FOLLOW UP  Pharmacy Consult for Electrolyte Monitoring and Replacement   Recent Labs: Potassium (mmol/L)  Date Value  10/14/2018 4.5   Magnesium (mg/dL)  Date Value  10/12/2018 2.0   Calcium (mg/dL)  Date Value  10/14/2018 8.7 (L)   Albumin (g/dL)  Date Value  10/03/2018 2.7 (L)   Phosphorus (mg/dL)  Date Value  10/12/2018 3.2   Sodium (mmol/L)  Date Value  10/14/2018 135  Corrected Ca: 9.3 mg/dL  Assessment: Patient admitted with hypokalemia. Pharmacy consulted for electrolyte monitoring and replacement. PEG placement on 7/6 and he started tube feeds yesterday. Feeding supplement: osmolite 1.5 @ goal rate of 52ml/hr. Patient is at risk for refeeding.    Goal of Therapy:  Electrolytes WNL  Plan:   No replacement needed at this time.   Will recheck labs every other day and continue to replace electrolytes as needed.   Oswald Hillock, PharmD, BCPS Clinical Pharmacist 10/14/2018 9:02 AM

## 2018-10-16 LAB — AEROBIC/ANAEROBIC CULTURE W GRAM STAIN (SURGICAL/DEEP WOUND)

## 2018-10-17 ENCOUNTER — Other Ambulatory Visit: Payer: Self-pay | Admitting: Student

## 2018-10-17 DIAGNOSIS — S065XAA Traumatic subdural hemorrhage with loss of consciousness status unknown, initial encounter: Secondary | ICD-10-CM

## 2018-10-17 DIAGNOSIS — S065X9A Traumatic subdural hemorrhage with loss of consciousness of unspecified duration, initial encounter: Secondary | ICD-10-CM

## 2018-10-18 ENCOUNTER — Encounter: Payer: Self-pay | Admitting: Oncology

## 2018-10-18 ENCOUNTER — Ambulatory Visit
Admission: RE | Admit: 2018-10-18 | Discharge: 2018-10-18 | Disposition: A | Discharge status: Home / Self Care | Payer: Medicare Other | Source: Ambulatory Visit | Attending: Student | Admitting: Student

## 2018-10-18 ENCOUNTER — Other Ambulatory Visit: Payer: Self-pay

## 2018-10-18 DIAGNOSIS — S065XAA Traumatic subdural hemorrhage with loss of consciousness status unknown, initial encounter: Secondary | ICD-10-CM

## 2018-10-18 DIAGNOSIS — S065X9A Traumatic subdural hemorrhage with loss of consciousness of unspecified duration, initial encounter: Secondary | ICD-10-CM

## 2018-10-25 LAB — HSV DNA BY PCR (REFERENCE LAB)
HSV 1 DNA: NEGATIVE
HSV 2 DNA: NEGATIVE

## 2018-10-26 ENCOUNTER — Inpatient Hospital Stay: Payer: Medicare Other

## 2018-10-26 ENCOUNTER — Other Ambulatory Visit: Payer: Self-pay

## 2018-10-26 ENCOUNTER — Encounter: Payer: Self-pay | Admitting: Oncology

## 2018-10-26 ENCOUNTER — Inpatient Hospital Stay: Payer: Medicare Other | Attending: Oncology

## 2018-10-26 ENCOUNTER — Inpatient Hospital Stay (HOSPITAL_BASED_OUTPATIENT_CLINIC_OR_DEPARTMENT_OTHER): Payer: Medicare Other | Admitting: Oncology

## 2018-10-26 VITALS — BP 104/76 | HR 90 | Temp 98.3°F | Resp 18 | Wt 117.4 lb

## 2018-10-26 DIAGNOSIS — M1990 Unspecified osteoarthritis, unspecified site: Secondary | ICD-10-CM | POA: Insufficient documentation

## 2018-10-26 DIAGNOSIS — R7989 Other specified abnormal findings of blood chemistry: Secondary | ICD-10-CM | POA: Diagnosis not present

## 2018-10-26 DIAGNOSIS — D473 Essential (hemorrhagic) thrombocythemia: Secondary | ICD-10-CM

## 2018-10-26 DIAGNOSIS — D508 Other iron deficiency anemias: Secondary | ICD-10-CM

## 2018-10-26 DIAGNOSIS — R5383 Other fatigue: Secondary | ICD-10-CM

## 2018-10-26 DIAGNOSIS — D500 Iron deficiency anemia secondary to blood loss (chronic): Secondary | ICD-10-CM | POA: Diagnosis present

## 2018-10-26 DIAGNOSIS — Z87891 Personal history of nicotine dependence: Secondary | ICD-10-CM | POA: Insufficient documentation

## 2018-10-26 DIAGNOSIS — I10 Essential (primary) hypertension: Secondary | ICD-10-CM | POA: Diagnosis not present

## 2018-10-26 DIAGNOSIS — Z8546 Personal history of malignant neoplasm of prostate: Secondary | ICD-10-CM | POA: Insufficient documentation

## 2018-10-26 DIAGNOSIS — R1310 Dysphagia, unspecified: Secondary | ICD-10-CM

## 2018-10-26 DIAGNOSIS — R972 Elevated prostate specific antigen [PSA]: Secondary | ICD-10-CM | POA: Diagnosis not present

## 2018-10-26 DIAGNOSIS — Z79899 Other long term (current) drug therapy: Secondary | ICD-10-CM | POA: Diagnosis not present

## 2018-10-26 DIAGNOSIS — D72829 Elevated white blood cell count, unspecified: Secondary | ICD-10-CM | POA: Insufficient documentation

## 2018-10-26 DIAGNOSIS — D75839 Thrombocytosis, unspecified: Secondary | ICD-10-CM

## 2018-10-26 LAB — CBC WITH DIFFERENTIAL/PLATELET
Abs Immature Granulocytes: 0.1 10*3/uL — ABNORMAL HIGH (ref 0.00–0.07)
Basophils Absolute: 0.1 10*3/uL (ref 0.0–0.1)
Basophils Relative: 1 %
Eosinophils Absolute: 0.3 10*3/uL (ref 0.0–0.5)
Eosinophils Relative: 3 %
HCT: 26.5 % — ABNORMAL LOW (ref 39.0–52.0)
Hemoglobin: 8 g/dL — ABNORMAL LOW (ref 13.0–17.0)
Immature Granulocytes: 1 %
Lymphocytes Relative: 16 %
Lymphs Abs: 1.7 10*3/uL (ref 0.7–4.0)
MCH: 27 pg (ref 26.0–34.0)
MCHC: 30.2 g/dL (ref 30.0–36.0)
MCV: 89.5 fL (ref 80.0–100.0)
Monocytes Absolute: 0.7 10*3/uL (ref 0.1–1.0)
Monocytes Relative: 6 %
Neutro Abs: 8 10*3/uL — ABNORMAL HIGH (ref 1.7–7.7)
Neutrophils Relative %: 73 %
Platelets: 452 10*3/uL — ABNORMAL HIGH (ref 150–400)
RBC: 2.96 MIL/uL — ABNORMAL LOW (ref 4.22–5.81)
RDW: 15.9 % — ABNORMAL HIGH (ref 11.5–15.5)
WBC: 10.8 10*3/uL — ABNORMAL HIGH (ref 4.0–10.5)
nRBC: 0 % (ref 0.0–0.2)

## 2018-10-26 LAB — RETIC PANEL
Immature Retic Fract: 8.5 % (ref 2.3–15.9)
RBC.: 2.96 MIL/uL — ABNORMAL LOW (ref 4.22–5.81)
Retic Count, Absolute: 25.5 10*3/uL (ref 19.0–186.0)
Retic Ct Pct: 0.9 % (ref 0.4–3.1)
Reticulocyte Hemoglobin: 22.5 pg — ABNORMAL LOW (ref >27.9)

## 2018-10-26 MED ORDER — IRON SUCROSE 20 MG/ML IV SOLN
200.0000 mg | Freq: Once | INTRAVENOUS | Status: AC
  Administered 2018-10-26: 14:00:00 200 mg via INTRAVENOUS
  Filled 2018-10-26: qty 10

## 2018-10-26 MED ORDER — SODIUM CHLORIDE 0.9 % IV SOLN
Freq: Once | INTRAVENOUS | Status: AC
  Administered 2018-10-26: 14:00:00 via INTRAVENOUS
  Filled 2018-10-26: qty 250

## 2018-10-26 NOTE — Progress Notes (Signed)
Hematology/Oncology Follow Up Note [facility]  [phone] [phone]  Patient Care Team: Juluis Pitch, MD as PCP - General (Family Medicine)   Name of the patient: [name]  [id number]  1934-01-25   REASON FOR VISIT  follow-up for anemia   PERTINENT HEMATOLOGY HISTORY Patient was admitted from 09/19/2018 to 10/14/2018, dizziness, lightheadedness, status post fall, also febrile Was found to have left subdural hematoma, with left frontal cortex CVA. Initial febrile illness was considered to be secondary to tick bite.  Received full course of doxycycline. Developed dysphagia, status post PEG placement, postop course was complicated with PEG site infection.  Treated with antibiotics. Acute on chronic anemia with melena and a left subdural hematoma.  Patient was seen by gastroenterology and was recommended to have outpatient colonoscopy.  EGD 10/03/2018 showed nonbleeding duodenal ulcer. Patient was also seen by me, patient received 1 dose of IV Venofer treatment. Patient has a remote history of prostate cancer following up with urology. During this admission, PSA was found to be elevated at 12. At the presentation patient also presented with extremely high ferritin level, Which gradually decreased.  Likely reactive.  INTERVAL HISTORY 85 y.o. male presents for follow-up of anemia.  Patient has a follow-up appointment with urologist next week. Patient was discharged from hospital to rehab facility. Today he feels energy level has slightly improved.  He has participated in exercising at rehab. Feels cold in the clinical examination room.  Denies any fever or chills. He has no concern of PEG tube site today. He was allowed to have ice chips by mouth.  Review of Systems  Constitutional: Positive for fatigue. Negative for chills and fever.  HENT:   Negative for hearing loss and voice change.   Eyes: Negative for eye problems and  icterus.  Respiratory: Negative for chest tightness, cough and shortness of breath.   Cardiovascular: Negative for chest pain and leg swelling.  Gastrointestinal: Negative for abdominal distention and abdominal pain.       PEG tube in place.    Endocrine: Negative for hot flashes.  Genitourinary: Negative for difficulty urinating, dysuria and frequency.   Musculoskeletal: Negative for arthralgias.  Skin: Negative for itching and rash.  Neurological: Negative for light-headedness and numbness.  Hematological: Negative for adenopathy. Bruises/bleeds easily.  Psychiatric/Behavioral: Negative for confusion.      No Known Allergies   Past Medical History:  Diagnosis Date   Arthritis    Cancer [facility])    Prostate Cancer   Hypertension      Past Surgical History:  Procedure Laterality Date   ESOPHAGOGASTRODUODENOSCOPY (EGD) WITH PROPOFOL N/A 12/06/2017   Procedure: ESOPHAGOGASTRODUODENOSCOPY (EGD) WITH PROPOFOL;  Surgeon: Jonathon Bellows, MD;  Location: [facility] ENDOSCOPY;  Service: Gastroenterology;  Laterality: N/A;   PEG PLACEMENT N/A 10/03/2018   Procedure: PERCUTANEOUS ENDOSCOPIC GASTROSTOMY (PEG) PLACEMENT;  Surgeon: Lucilla Lame, MD;  Location: [facility] ENDOSCOPY;  Service: Endoscopy;  Laterality: N/A;   PROSTATE CRYOABLATION      Social History   Socioeconomic History   Marital status: Widowed    Spouse name: Not on file   Number of children: Not on file   Years of education: Not on file   Highest education level: Not on file  Occupational History   Not on file  Social Needs   Financial resource strain: Not on file   Food insecurity    Worry: Not on file    Inability: Not on file   Transportation needs    Medical:  Not on file    Non-medical: Not on file  Tobacco Use   Smoking status: Former Smoker    Types: Pipe    Quit date: 03/30/2002    Years since quitting: 16.5   Smokeless tobacco: Never Used  Substance and Sexual Activity   Alcohol use: Yes   Drug  use: Never   Sexual activity: Not on file  Lifestyle   Physical activity    Days per week: Not on file    Minutes per session: Not on file   Stress: Not on file  Relationships   Social connections    Talks on phone: Not on file    Gets together: Not on file    Attends religious service: Not on file    Active member of club or organization: Not on file    Attends meetings of clubs or organizations: Not on file    Relationship status: Not on file   Intimate partner violence    Fear of current or ex partner: Not on file    Emotionally abused: Not on file    Physically abused: Not on file    Forced sexual activity: Not on file  Other Topics Concern   Not on file  Social History Narrative   Not on file    History reviewed. No pertinent family history.   Current Outpatient Medications:    acetaminophen (TYLENOL) 325 MG tablet, Place 2 tablets (650 mg total) into feeding tube every 6 (six) hours as needed for mild pain., Disp:  , Rfl:    cephALEXin (KEFLEX) 500 MG capsule, Place 1 capsule (500 mg total) into feeding tube every 6 (six) hours., Disp: 24 capsule, Rfl: 0   guaiFENesin-dextromethorphan (ROBITUSSIN DM) 100-10 MG/5ML syrup, Place 5 mLs into feeding tube every 6 (six) hours as needed for cough., Disp: 118 mL, Rfl: 0   HYDROcodone-acetaminophen (NORCO/VICODIN) 5-325 MG tablet, Place 1 tablet into feeding tube every 6 (six) hours as needed for moderate pain., Disp: 15 tablet, Rfl: 0   metoprolol tartrate (LOPRESSOR) 25 MG tablet, Place 1 tablet (25 mg total) into feeding tube 2 (two) times daily., Disp:  , Rfl:    neomycin-bacitracin-polymyxin (NEOSPORIN) ointment, Apply topically daily., Disp: 15 g, Rfl: 0   Nutritional Supplements (FEEDING SUPPLEMENT, OSMOLITE 1.5 CAL,) LIQD, Place 237 mLs into feeding tube 6 (six) times daily., Disp: 1000 mL, Rfl: 3   nystatin (MYCOSTATIN/NYSTOP) powder, Apply topically 3 (three) times daily., Disp: 30 g, Rfl: 0   ondansetron  (ZOFRAN) 4 MG tablet, Place 1 tablet (4 mg total) into feeding tube every 6 (six) hours as needed for nausea., Disp: 20 tablet, Rfl: 0   polyethylene glycol (MIRALAX / GLYCOLAX) 17 g packet, Place 17 g into feeding tube daily as needed for mild constipation., Disp: 14 each, Rfl: 0   vitamin B-12 (CYANOCOBALAMIN) 500 MCG tablet, Place 1 tablet (500 mcg total) into feeding tube daily., Disp:  , Rfl:    vitamin C (VITAMIN C) 250 MG tablet, Place 1 tablet (250 mg total) into feeding tube 2 (two) times daily., Disp:  , Rfl:    Water For Irrigation, Sterile (FREE WATER) SOLN, Place 75 mLs into feeding tube every 4 (four) hours., Disp:  , Rfl:    Water For Irrigation, Sterile (FREE WATER) SOLN, Place 30 mLs into feeding tube 6 (six) times daily., Disp:  , Rfl:   Physical exam:  Vitals:   10/26/18 1316  BP: 104/76  Pulse: 90  Resp: 18  Temp: 98.3  F (36.8 C)  TempSrc: Tympanic  SpO2: 98%  Weight: 117 lb 6.4 oz (53.3 kg)   Physical Exam Constitutional:      General: He is not in acute distress.    Appearance: He is ill-appearing.     Comments: Thin, sitting in wheelchair.  HENT:     Head: Normocephalic and atraumatic.  Eyes:     General: No scleral icterus.    Pupils: Pupils are equal, round, and reactive to light.  Neck:     Musculoskeletal: Normal range of motion and neck supple.  Cardiovascular:     Rate and Rhythm: Normal rate and regular rhythm.     Heart sounds: Normal heart sounds.  Pulmonary:     Effort: Pulmonary effort is normal. No respiratory distress.     Breath sounds: No wheezing.  Abdominal:     General: Bowel sounds are normal. There is no distension.     Palpations: Abdomen is soft. There is no mass.     Tenderness: There is no abdominal tenderness.     Comments: PEG tube in place.  Musculoskeletal: Normal range of motion.        General: No deformity.  Skin:    General: Skin is warm and dry.     Findings: No erythema or rash.  Neurological:     Mental  Status: He is alert and oriented to person, place, and time.     Cranial Nerves: No cranial nerve deficit.  Psychiatric:        Behavior: Behavior normal.        Thought Content: Thought content normal.     CMP Latest Ref Rng & Units 10/14/2018  Glucose 70 - 99 mg/dL 147(H)  BUN 8 - 23 mg/dL 20  Creatinine 0.61 - 1.24 mg/dL 0.65  Sodium 135 - 145 mmol/L 135  Potassium 3.5 - 5.1 mmol/L 4.5  Chloride 98 - 111 mmol/L 101  CO2 22 - 32 mmol/L 26  Calcium 8.9 - 10.3 mg/dL 8.7(L)  Total Protein 6.5 - 8.1 g/dL -  Total Bilirubin 0.3 - 1.2 mg/dL -  Alkaline Phos 38 - 126 U/L -  AST 15 - 41 U/L -  ALT 0 - 44 U/L -   CBC Latest Ref Rng & Units 10/26/2018  WBC 4.0 - 10.5 K/uL 10.8(H)  Hemoglobin 13.0 - 17.0 g/dL 8.0(L)  Hematocrit 39.0 - 52.0 % 26.5(L)  Platelets 150 - 400 K/uL 452(H)    Ct Abdomen Wo Contrast  Result Date: 09/28/2018 CLINICAL DATA:  Dysphagia. Evaluate anatomy prior to potential percutaneous gastrostomy tube placement EXAM: CT ABDOMEN WITHOUT CONTRAST TECHNIQUE: Multidetector CT imaging of the abdomen was performed following the standard protocol without IV contrast. COMPARISON:  CT abdomen pelvis-01/20/2017; abdominal ultrasound-09/20/2018 FINDINGS: Lack of intravenous contrast limits the ability to evaluate solid abdominal organs. Lower chest: Limited visualization of the lower thorax demonstrates small bilateral effusions with associated bibasilar consolidative opacities. Normal heart size. Coronary artery calcifications. Trace amount of pericardial fluid, presumably physiologic. There is diffuse decreased attenuation of the intra cardiac blood pool suggestive of anemia. Hepatobiliary: Normal hepatic contour. Scattered punctate granuloma are seen within the liver. Normal noncontrast appearance of the gallbladder given degree distention. No radiopaque gallstones. No ascites. Pancreas: Normal noncontrast appearance of the pancreas. Spleen: Punctate granuloma within otherwise  normal-appearing spleen. Adrenals/Urinary Tract: Note is made of 3 nonobstructing left-sided renal stones with dominant nonobstructing stone within the interpolar aspect of the left kidney measuring 1.1 x 0.7 cm (coronal image 46, series  5). Note is made of a punctate (approximately 3 mm) nonobstructing stone within the inferior pole of the right kidney (coronal image 9, series 5). Previously characterized bilateral renal cysts are grossly unchanged with dominant partially exophytic cyst arising from the inferior pole of the right kidney measuring approximately 8.0 x 6.1 cm and is again noted to have thin peripheral mural calcifications, unchanged compared to contrast-enhanced CT scan performed 01/20/2017. No urinary obstruction. Normal noncontrast appearance of the bilateral adrenal glands. The urinary bladder was not imaged. Stomach/Bowel: The anterior wall of the stomach is well apposed against the ventral wall of the upper abdomen without interposed liver or transverse colon. No hiatal hernia. Ingested enteric contrast is seen within the colon. No definite evidence of enteric obstruction. No pneumoperitoneum, pneumatosis or portal venous gas. Vascular/Lymphatic: Atherosclerotic plaque within a normal caliber abdominal aorta. No bulky retroperitoneal, mesenteric, pelvic or inguinal lymphadenopathy on this noncontrast examination. Other: Regional soft tissues are normal. Musculoskeletal: No acute or aggressive osseous abnormalities. Stigmata of DISH within the thoracic spine. IMPRESSION: 1. Gastric anatomy amenable to attempted percutaneous gastrostomy tube placement as indicated. 2. Small bilateral effusions with associated basilar opacities - atelectasis versus infiltrate. 3. Sequela of prior granulomatous infection as above. 4.  Aortic Atherosclerosis (ICD10-I70.0). Electronically Signed   By: Sandi Mariscal M.D.   On: 09/28/2018 16:48   Dg Chest 1 View  Result Date: 10/07/2018 CLINICAL DATA:  85 year old male  with history of fever. Lightheadedness. Former smoker. EXAM: CHEST  1 VIEW COMPARISON:  Chest x-ray 10/04/2018. FINDINGS: Extensive skin fold artifact projecting over the lateral right hemithorax. Lung volumes are normal. No consolidative airspace disease. No pleural effusions. No pneumothorax. No pulmonary nodule or mass noted. Pulmonary vasculature and the cardiomediastinal silhouette are within normal limits. Aortic atherosclerosis. Old healed right-sided rib fractures are again incidentally noted. IMPRESSION: 1. No radiographic evidence of acute cardiopulmonary disease. 2. Aortic atherosclerosis. Electronically Signed   By: Vinnie Langton M.D.   On: 10/07/2018 08:34   Dg Chest 1 View  Result Date: 10/04/2018 CLINICAL DATA:  Cough EXAM: CHEST  1 VIEW COMPARISON:  09/19/2018 FINDINGS: Cardiac shadows within normal limits. Aortic calcifications are again seen. Old rib fractures are noted with healing on the right. Multiple skin folds are noted bilaterally. The lungs are well aerated without focal infiltrate or sizable effusion. No pneumothorax is noted. IMPRESSION: No acute abnormality noted. Electronically Signed   By: Inez Catalina M.D.   On: 10/04/2018 16:10   Ct Head Wo Contrast  Result Date: 10/18/2018 CLINICAL DATA:  Follow-up subdural hematoma. EXAM: CT HEAD WITHOUT CONTRAST TECHNIQUE: Contiguous axial images were obtained from the base of the skull through the vertex without intravenous contrast. COMPARISON:  10/05/2018, 09/21/2018 and MRI brain 09/21/2018 FINDINGS: Brain: Ventricles, cisterns and other CSF spaces are unchanged with mild age related atrophy. Chronic ischemic microvascular disease is present. There is no evidence of mass or midline shift. No intraparenchymal hemorrhage. Evidence of continued improvement of patient's small left convexity subacute subdural hematoma measuring 4 mm in thickness (previously 8 mm). No acute hemorrhagic component as seen previously. Subtle local sulcal  effacement. Remainder the exam is unchanged. Vascular: No hyperdense vessel or unexpected calcification. Skull: Normal. Negative for fracture or focal lesion. Sinuses/Orbits: No acute finding. Other: None. IMPRESSION: Improving small left convexity subacute subdural hematoma now measuring 4 mm in thickness (previously 8 mm). No new findings. Mild atrophy and chronic ischemic microvascular disease. Electronically Signed   By: Marin Olp M.D.   On: 10/18/2018  20:49   Ct Head Wo Contrast  Result Date: 10/05/2018 CLINICAL DATA:  Left subdural hemorrhage. EXAM: CT HEAD WITHOUT CONTRAST TECHNIQUE: Contiguous axial images were obtained from the base of the skull through the vertex without intravenous contrast. COMPARISON:  CT scan of September 21, 2018. FINDINGS: Brain: Mild diffuse cortical atrophy is noted. Mild chronic ischemic white matter disease is noted. Subdural hematoma noted over left cerebral cortex on prior exam is again noted and grossly stable in size. Maximum thickness is stable approximately 8 mm. There does appear to be slightly increased left tentorial and posterior left para falcine hematoma. Ventricular size is within normal limits. Vascular: No hyperdense vessel or unexpected calcification. Skull: Normal. Negative for fracture or focal lesion. Sinuses/Orbits: No acute finding. Other: None. IMPRESSION: Subdural hematoma overlying left cerebral cortex noted on prior exam is again noted and grossly stable in size, although appears to be more heterogeneous in density compared to prior exam. There does appear to be slightly increased subdural hematoma involving the left posterior parafalcine region and tentorial region. No significant midline shift is noted currently. Electronically Signed   By: Marijo Conception M.D.   On: 10/05/2018 15:01     Assessment and plan 1. Iron deficiency anemia due to chronic blood loss   2. History of prostate cancer   3. Elevated PSA   4. Dysphagia, unspecified type     5. Leukocytosis, unspecified type   6. Thrombocytosis ([place])    Labs are reviewed and discussed with patient. Hemoglobin 8 increased from 1 week ago. Reticulocyte hemoglobin persistently decreased.  Consistent with iron deficiency. He received 2 doses of IV Venofer during most recent admission.  Tolerated well. Recommend to proceed with IV Venofer today and repeat weekly doses x3. recommend patient to establish outpatient follow-up with gastroenterology. Close monitoring hemoglobin.  If hemoglobin persistently decreased despite replete of iron store, would recommend proceeding with bone marrow biopsy for further evaluation.  Elevated PSA, patient will need additional work-up.  Per daughter, and has an upcoming appointment with urology.  #Dysphagia, PEG tube feeding. #Leukocytosis,.  Trending down.  Likely reactive #Thrombocytosis, possible secondary to iron deficiency.  Pending above work-up.  Patient's daughter [name] was called and updated.  She appreciated calling agree with above plan.  Orders Placed This Encounter  Procedures   CBC with Differential/Platelet    Standing Status:   Future    Standing Expiration Date:   10/26/2019   Iron and TIBC    Standing Status:   Future    Standing Expiration Date:   10/26/2019   Ferritin    Standing Status:   Future    Standing Expiration Date:   10/26/2019   Retic Panel    Standing Status:   Future    Standing Expiration Date:   10/26/2019    We spent sufficient time to discuss many aspect of care, questions were answered to patient's satisfaction. Total face to face encounter time for this patient visit was 25 min. >50% of the time was  spent in counseling and coordination of care.   Earlie Server, MD, PhD Hematology Oncology [place] [facility] 10/26/2018

## 2018-10-26 NOTE — Progress Notes (Signed)
Pt in for hospital follow up, in rehab at [facility].  Has feeding tube, only having ice chips by mouth.

## 2018-10-27 ENCOUNTER — Telehealth: Payer: Self-pay | Admitting: Urology

## 2018-10-27 ENCOUNTER — Encounter: Payer: Self-pay | Admitting: Urology

## 2018-10-27 ENCOUNTER — Ambulatory Visit: Payer: Self-pay | Admitting: Urology

## 2018-10-27 ENCOUNTER — Ambulatory Visit: Payer: Self-pay

## 2018-10-27 ENCOUNTER — Ambulatory Visit (INDEPENDENT_AMBULATORY_CARE_PROVIDER_SITE_OTHER): Payer: Medicare Other | Admitting: Urology

## 2018-10-27 VITALS — BP 127/77 | HR 90 | Ht 72.0 in

## 2018-10-27 DIAGNOSIS — R339 Retention of urine, unspecified: Secondary | ICD-10-CM | POA: Diagnosis not present

## 2018-10-27 DIAGNOSIS — C61 Malignant neoplasm of prostate: Secondary | ICD-10-CM

## 2018-10-27 DIAGNOSIS — N200 Calculus of kidney: Secondary | ICD-10-CM

## 2018-10-27 MED ORDER — TAMSULOSIN HCL 0.4 MG PO CAPS: 0.4000 mg | ORAL_CAPSULE | Freq: Every day | ORAL | 11 refills | Status: DC

## 2018-10-27 NOTE — Progress Notes (Signed)
Cath Change/ Replacement  Patient is present today for a catheter change due to urinary retention.  76ml of water was removed from the balloon, a 16FR foley cath was removed with out difficulty.  Patient was cleaned and prepped in a sterile fashion with betadine and 2% lidocaine jelly was instilled into the urethra. A 16 coude FR foley cath was replaced into the bladder no complications were noted Urine return was noted 28mlml and urine was yellow in color. The balloon was filled with 13ml of sterile water. A night bag was attached for drainage.   Preformed by: Fonnie Jarvis, CMA  Follow up: 3wk for a fill and pull and 30months with [facility]

## 2018-10-27 NOTE — Telephone Encounter (Signed)
Pt's speech therapist called office to confirm that pt does not have an active cancer diagnosis.  He has a swallowing device that may have a contraindication with a cancer diagnosis.  I s/w Dr Diamantina Providence and he said pt does not.

## 2018-10-27 NOTE — Patient Instructions (Signed)
Acute Urinary Retention, Male  Acute urinary retention means that you cannot pee (urinate) at all, or that you pee too little and your bladder is not emptied completely. If it is not treated, it can lead to kidney damage or other serious problems. Follow these instructions at home:  Take over-the-counter and prescription medicines only as told by your doctor. Ask your doctor what medicines you should stay away from. Do not take any medicine unless your doctor says it is okay to do so.  If you were sent home with a tube that drains the bladder (catheter), take care of it as told by your doctor.  Drink enough fluid to keep your pee clear or pale yellow.  If you were given an antibiotic, take it as told by your doctor. Do not stop taking the antibiotic even if you start to feel better.  Do not use any products that contain nicotine or tobacco, such as cigarettes and e-cigarettes. If you need help quitting, ask your doctor.  Watch for changes in your symptoms. Tell your doctor about them.  If told, track changes in your blood pressure at home. Tell your doctor about them.  Keep all follow-up visits as told by your doctor. This is important. Contact a doctor if:  You have spasms or you leak pee when you have spasms. Get help right away if:  You have chills or a fever.  You have a tube that drains the bladder and: ? The tube stops draining pee. ? The tube falls out.  You have blood in your pee. Summary  Acute urinary retention means that you have problems peeing. It may mean that you cannot pee at all, or that you pee too little.  If this condition is not treated, it can lead to kidney damage or other serious problems.  If you were sent home with a tube that drains the bladder, take care of it as told by your doctor.  Monitor any changes in your symptoms. Tell your doctor about any changes. This information is not intended to replace advice given to you by your health care  provider. Make sure you discuss any questions you have with your health care provider. Document Released: 09/02/2007 Document Revised: 06/02/2018 Document Reviewed: 04/17/2016 Elsevier Patient Education  2020 [employer].

## 2018-10-27 NOTE — Progress Notes (Signed)
10/27/18 8:11 AM   [name] Sep 01, 1933 [id number]  Referring provider: [name], MD [address] [place],  [place] [address]  CC: Urinary retention  HPI: I saw Mr. Denker in urology clinic today in consultation from Dr. Natale Milch for urinary retention.  He is an 85 year old male that recently had a prolonged hospitalization after a syncopal episode and fall.  A Foley catheter was placed during this hospitalization for unclear indications.  He has been extremely weak, and was discharged to rehab facility.  He is still in a wheelchair, but is now able to ambulate with a walker.  He denies any urinary symptoms prior to this hospitalization.  He denies any history of gross hematuria.  His history is notable for prostate cancer treated in the early 2000's with Dr. Jacqlyn Larsen with cryoablation.  None of these records are available to me.  He was reportedly told he was cured of his prostate cancer.  PSA on 09/28/18 with Foley in place was 12.8.  On chart review last PSA was 3.3 in October 2018, 1.2 in October 2017, 1.2 in 2016, and 2.6 in 2015.  There are no aggravating or alleviating factors.  Severity is mild to moderate.  He is tolerating the catheter well.  He failed a swallow study, and is currently being nourished via PEG tube.   PMH: Past Medical History:  Diagnosis Date  . Arthritis   . Cancer [facility])    Prostate Cancer  . Hypertension     Surgical History: Past Surgical History:  Procedure Laterality Date  . ESOPHAGOGASTRODUODENOSCOPY (EGD) WITH PROPOFOL N/A 12/06/2017   Procedure: ESOPHAGOGASTRODUODENOSCOPY (EGD) WITH PROPOFOL;  Surgeon: Jonathon Bellows, MD;  Location[facility] ENDOSCOPY;  Service: Gastroenterology;  Laterality: N/A;  . PEG PLACEMENT N/A 10/03/2018   Procedure: PERCUTANEOUS ENDOSCOPIC GASTROSTOMY (PEG) PLACEMENT;  Surgeon: Lucilla Lame, MD;  Location: [facility] ENDOSCOPY;  Service: Endoscopy;  Laterality: N/A;  . PROSTATE CRYOABLATION     Allergies: No Known Allergies   Family History: No pertinent family history  Social History:  reports that he quit smoking about 16 years ago. His smoking use included pipe. He has never used smokeless tobacco. He reports current alcohol use. He reports that he does not use drugs.  ROS: Please see flowsheet from today's date for complete review of systems.  Physical Exam: BP 127/77   Pulse 90   Ht 6' (1.829 m)   BMI 15.92 kg/m    Constitutional: Frail-appearing, in wheelchair Cardiovascular: No clubbing, cyanosis, or edema. Respiratory: Normal respiratory effort, no increased work of breathing. GI: Abdomen is soft, nontender, nondistended, no abdominal masses OA:CZYSAYT without lesions, widely patent meatus, clear yellow urine per Foley Lymph: No cervical or inguinal lymphadenopathy. Skin: No rashes, bruises or suspicious lesions. Neurologic: Grossly intact, no focal deficits, moving all 4 extremities. Psychiatric: Normal mood and affect.  Laboratory Data: Reviewed  Pertinent Imaging: I have personally reviewed the CT abdomen 09/28/2018.  No bulky lymphadenopathy.  Two 1 cm nonobstructing left-sided renal stones, no hydronephrosis.  Assessment & Plan:   In summary, the patient is an 85 year old frail gentleman who was hospitalized for 3 weeks after a syncopal episode and fall.  A Foley catheter was placed during his hospitalizations for unclear indications.  He remains in a wheelchair and is getting stronger and rehab currently.  He also has a history of prostate cancer in the early 2000's treated with cryoablation by Dr. Jacqlyn Larsen.  PSA was elevated at 12 during the hospitalization with his catheter in  place from baseline PSA of 3, however likely falsely elevated secondary to catheter in place.  Even if this represented a recurrence of his prostate cancer, with his co-morbidities he would not be a candidate for any adjuvant therapies and a watchful waiting approach would be most appropriate.  We discussed this at length.   Would not recommend any intervention at this time for his nonobstructing left-sided renal stones, unless they were to become acutely obstructing and painful or infected with his comorbidities.  -Start Flomax 0.4 mg nightly -Foley exchanged today -Follow-up for nurse visit in 2 to 3 weeks for void trial when he is stronger and is started Flomax.  Will need antibiotic prior to Foley removal. -RTC with me with 3 months with PSA  A total of 60 minutes were spent face-to-face with the patient, greater than 50% was spent in patient education, counseling, and coordination of care regarding urinary retention, prostate cancer, and nephrolithiasis.   Billey Co, [facility] [address], [place], [place] [address] [phone]

## 2018-11-01 NOTE — Telephone Encounter (Signed)
Close encounter 

## 2018-11-03 ENCOUNTER — Other Ambulatory Visit: Payer: Self-pay

## 2018-11-03 ENCOUNTER — Inpatient Hospital Stay: Payer: Medicare Other | Attending: Oncology

## 2018-11-03 VITALS — BP 106/65 | HR 92 | Resp 18

## 2018-11-03 DIAGNOSIS — Z87891 Personal history of nicotine dependence: Secondary | ICD-10-CM | POA: Insufficient documentation

## 2018-11-03 DIAGNOSIS — R197 Diarrhea, unspecified: Secondary | ICD-10-CM | POA: Insufficient documentation

## 2018-11-03 DIAGNOSIS — D696 Thrombocytopenia, unspecified: Secondary | ICD-10-CM | POA: Diagnosis not present

## 2018-11-03 DIAGNOSIS — D72829 Elevated white blood cell count, unspecified: Secondary | ICD-10-CM | POA: Insufficient documentation

## 2018-11-03 DIAGNOSIS — R1310 Dysphagia, unspecified: Secondary | ICD-10-CM | POA: Insufficient documentation

## 2018-11-03 DIAGNOSIS — Z79899 Other long term (current) drug therapy: Secondary | ICD-10-CM | POA: Insufficient documentation

## 2018-11-03 DIAGNOSIS — R972 Elevated prostate specific antigen [PSA]: Secondary | ICD-10-CM | POA: Insufficient documentation

## 2018-11-03 DIAGNOSIS — K921 Melena: Secondary | ICD-10-CM | POA: Insufficient documentation

## 2018-11-03 DIAGNOSIS — N390 Urinary tract infection, site not specified: Secondary | ICD-10-CM | POA: Diagnosis not present

## 2018-11-03 DIAGNOSIS — D508 Other iron deficiency anemias: Secondary | ICD-10-CM

## 2018-11-03 DIAGNOSIS — S065X9A Traumatic subdural hemorrhage with loss of consciousness of unspecified duration, initial encounter: Secondary | ICD-10-CM | POA: Diagnosis not present

## 2018-11-03 DIAGNOSIS — R42 Dizziness and giddiness: Secondary | ICD-10-CM | POA: Insufficient documentation

## 2018-11-03 DIAGNOSIS — R5383 Other fatigue: Secondary | ICD-10-CM | POA: Insufficient documentation

## 2018-11-03 DIAGNOSIS — R000 Tachycardia, unspecified: Secondary | ICD-10-CM | POA: Insufficient documentation

## 2018-11-03 DIAGNOSIS — Z931 Gastrostomy status: Secondary | ICD-10-CM | POA: Diagnosis not present

## 2018-11-03 DIAGNOSIS — Z8546 Personal history of malignant neoplasm of prostate: Secondary | ICD-10-CM | POA: Diagnosis not present

## 2018-11-03 DIAGNOSIS — I10 Essential (primary) hypertension: Secondary | ICD-10-CM | POA: Insufficient documentation

## 2018-11-03 DIAGNOSIS — D500 Iron deficiency anemia secondary to blood loss (chronic): Secondary | ICD-10-CM | POA: Insufficient documentation

## 2018-11-03 DIAGNOSIS — M1990 Unspecified osteoarthritis, unspecified site: Secondary | ICD-10-CM | POA: Insufficient documentation

## 2018-11-03 MED ORDER — SODIUM CHLORIDE 0.9 % IV SOLN
Freq: Once | INTRAVENOUS | Status: AC
  Administered 2018-11-03: 13:00:00 via INTRAVENOUS
  Filled 2018-11-03: qty 250

## 2018-11-03 MED ORDER — IRON SUCROSE 20 MG/ML IV SOLN
200.0000 mg | Freq: Once | INTRAVENOUS | Status: AC
  Administered 2018-11-03: 200 mg via INTRAVENOUS
  Filled 2018-11-03: qty 10

## 2018-11-08 ENCOUNTER — Other Ambulatory Visit: Payer: Self-pay | Admitting: Family Medicine

## 2018-11-08 DIAGNOSIS — R1312 Dysphagia, oropharyngeal phase: Secondary | ICD-10-CM

## 2018-11-09 ENCOUNTER — Other Ambulatory Visit: Payer: Self-pay

## 2018-11-09 ENCOUNTER — Inpatient Hospital Stay: Payer: Medicare Other

## 2018-11-09 VITALS — BP 101/66 | HR 102 | Resp 20

## 2018-11-09 DIAGNOSIS — D500 Iron deficiency anemia secondary to blood loss (chronic): Secondary | ICD-10-CM | POA: Diagnosis not present

## 2018-11-09 DIAGNOSIS — D508 Other iron deficiency anemias: Secondary | ICD-10-CM

## 2018-11-09 MED ORDER — SODIUM CHLORIDE 0.9 % IV SOLN
Freq: Once | INTRAVENOUS | Status: AC
  Administered 2018-11-09: 14:00:00 via INTRAVENOUS
  Filled 2018-11-09: qty 250

## 2018-11-09 NOTE — Progress Notes (Signed)
13:38 - patient HR of 160, Dr. Tasia Catchings notified who asked to consult Rulon Abide, NP, Sonia Baller came over to see the patient, ordered an EKG and 500 ml of NS per Dr. Tasia Catchings, patient possibly dehydrated.  Recheck of HR after 30 minutes of fluid is 102.  Per Rulon Abide, NP discharge patient after fluids.

## 2018-11-10 ENCOUNTER — Encounter: Payer: Self-pay | Admitting: Oncology

## 2018-11-11 ENCOUNTER — Telehealth: Payer: Self-pay | Admitting: Oncology

## 2018-11-11 NOTE — Telephone Encounter (Signed)
Spoke to patient's daughter [name] today.  Attempted to call her on Wednesday after Mr. Sharol Roussel SVT incident prior to IV iron.  Prior to patient receiving IV Venofer, infusion RN stated that while checking his vital signs she noticed his heart rate was 164.  She contacted Dr. Tasia Catchings who recommended NP evaluation prior to infusion.  We obtained an EKG which showed SVT with a heart rate of 165.  Patient asymptomatic.  He denied any shortness of breath, chest pain or palpitations.  Per medical chart review, no history of cardiac issues.  Initially recommended ED evaluation but patient refused.  Recommended 1 L NaCl.  If he developed any symptoms at all he was to be taken to the emergency room immediately.  Patient reevaluated after 1 L of IV fluids and repeat EKG which showed sinus tachycardia with a heart rate fluctuating between 101-103.  Vital signs remained stable throughout the entire event.  SVT likely due to dehydration.  Recommend staying hydrated.  Will reach out to [facility] lives to encourage oral hydration.  I explained everything to daughter [name] today and she is on board with trying to keep him hydrated.  She states that today her father has developed a fever and hypotension.  Unclear source of infection.   I explained to her that there was no need for him to make that dose of IV iron up at this point given he is not feeling well.  We will let Dr. Tasia Catchings evaluate next Wednesday and offer her recommendations on additional IV iron.  She was in agreement with plan.  Faythe Casa, NP 11/11/2018 11:24 AM

## 2018-11-11 NOTE — Telephone Encounter (Signed)
Yes I will. I tried twice and left a VM but will try again. Thanks.   Sonia Baller

## 2018-11-14 ENCOUNTER — Encounter: Payer: Self-pay | Admitting: Oncology

## 2018-11-15 ENCOUNTER — Telehealth: Payer: Self-pay | Admitting: Oncology

## 2018-11-15 ENCOUNTER — Other Ambulatory Visit: Payer: Self-pay | Admitting: Oncology

## 2018-11-15 MED ORDER — FERROUS SULFATE 300 (60 FE) MG/5ML PO SYRP: 300.0000 mg | ORAL_SOLUTION | Freq: Every day | ORAL | 3 refills | Status: DC

## 2018-11-15 NOTE — Telephone Encounter (Signed)
Patient's daughter [name] called clinic stating that [name] has developed a fever and is receiving antibiotics at [place] home.  He is also having significant diarrhea thought to be due to antibiotics.  Dr. Tasia Catchings would like to cancel his appointment for tomorrow and see him back in approximately 1 week. (Appt already made).  If able to tolerate, she would like for [name] to start on oral iron.  Patient uses PEG tube for all medications and meals.  Spoke to [facility] pharmacy about options for oral iron.   RX Ferrous Sulfate 300 mg/5 ml syrup per tube.   Faythe Casa, NP 11/15/2018 12:53 PM

## 2018-11-15 NOTE — Telephone Encounter (Signed)
-----   Message from Billey Co, MD sent at 11/14/2018  5:02 PM EDT ----- Regarding: move follow up Please move his voiding trial appt from this Thursday to end of next week, thanks  Nickolas Madrid, MD 11/14/2018

## 2018-11-16 ENCOUNTER — Inpatient Hospital Stay: Payer: Medicare Other

## 2018-11-17 ENCOUNTER — Ambulatory Visit: Payer: Medicare Other

## 2018-11-17 ENCOUNTER — Encounter: Payer: Self-pay | Admitting: Oncology

## 2018-11-18 ENCOUNTER — Other Ambulatory Visit: Payer: Self-pay | Admitting: Oncology

## 2018-11-18 ENCOUNTER — Other Ambulatory Visit: Payer: Self-pay | Admitting: *Deleted

## 2018-11-18 ENCOUNTER — Telehealth: Payer: Self-pay | Admitting: Oncology

## 2018-11-18 DIAGNOSIS — D508 Other iron deficiency anemias: Secondary | ICD-10-CM

## 2018-11-18 MED ORDER — FERROUS SULFATE 300 (60 FE) MG/5ML PO SYRP: 300.0000 mg | ORAL_SOLUTION | Freq: Every day | ORAL | 3 refills | Status: AC

## 2018-11-18 NOTE — Telephone Encounter (Signed)
Spoke to patient's nurse at [facility].  She informed me that patient has had a "rough" week.  He developed a fever on 8/13 of 100.8 that lasted until 8/17.  He was diagnosed with a UTI.  He was treated with IV Rocephin and a Z-Pak.  He has been afebrile since 11/15/2018.  He is improving.  RN Marliss Coots) concerned about elevated white count.  They have collected labs every other day for the past 10 days. They are as follows.  11/09/2018: 18,000 (WBC); 7.9 (hemoglobin) 11/11/2018: 19,500 (WBC); 7.4 11/14/2018: 15,300 (WBC); 7.1 11/16/2018: 21,000 (WBC); 7.9 Leukocytosis likely due to acute illness (UTI).   He is scheduled to have labs drawn again tomorrow.  [facility] will fax over.   Previous lab work at the cancer center also revealed leukocytosis thought to be reactive.   He is scheduled to return to our clinic on 11/21/2018 for assessment prior to his IV iron infusion. I will get him added on for possible blood transfusion as well given his hemoglobin is trending down and has missed his last two iron infusion d/t illness.  Faythe Casa, NP 11/18/2018 11:05 AM  CC: Dr. Tasia Catchings

## 2018-11-21 ENCOUNTER — Inpatient Hospital Stay: Payer: Medicare Other

## 2018-11-21 ENCOUNTER — Telehealth: Payer: Self-pay | Admitting: Oncology

## 2018-11-21 ENCOUNTER — Other Ambulatory Visit: Payer: Self-pay

## 2018-11-21 DIAGNOSIS — R1310 Dysphagia, unspecified: Secondary | ICD-10-CM

## 2018-11-21 DIAGNOSIS — D500 Iron deficiency anemia secondary to blood loss (chronic): Secondary | ICD-10-CM | POA: Diagnosis not present

## 2018-11-21 DIAGNOSIS — Z8546 Personal history of malignant neoplasm of prostate: Secondary | ICD-10-CM

## 2018-11-21 DIAGNOSIS — D508 Other iron deficiency anemias: Secondary | ICD-10-CM

## 2018-11-21 LAB — CBC WITH DIFFERENTIAL/PLATELET
Abs Immature Granulocytes: 0.5 10*3/uL — ABNORMAL HIGH (ref 0.00–0.07)
Basophils Absolute: 0.1 10*3/uL (ref 0.0–0.1)
Basophils Relative: 0 %
Eosinophils Absolute: 0.1 10*3/uL (ref 0.0–0.5)
Eosinophils Relative: 1 %
HCT: 25.6 % — ABNORMAL LOW (ref 39.0–52.0)
Hemoglobin: 8 g/dL — ABNORMAL LOW (ref 13.0–17.0)
Immature Granulocytes: 2 %
Lymphocytes Relative: 6 %
Lymphs Abs: 1.5 10*3/uL (ref 0.7–4.0)
MCH: 25.6 pg — ABNORMAL LOW (ref 26.0–34.0)
MCHC: 31.3 g/dL (ref 30.0–36.0)
MCV: 81.8 fL (ref 80.0–100.0)
Monocytes Absolute: 1.2 10*3/uL — ABNORMAL HIGH (ref 0.1–1.0)
Monocytes Relative: 5 %
Neutro Abs: 20.9 10*3/uL — ABNORMAL HIGH (ref 1.7–7.7)
Neutrophils Relative %: 86 %
Platelets: 943 10*3/uL (ref 150–400)
RBC: 3.13 MIL/uL — ABNORMAL LOW (ref 4.22–5.81)
RDW: 18.1 % — ABNORMAL HIGH (ref 11.5–15.5)
Smear Review: NORMAL
WBC: 24.3 10*3/uL — ABNORMAL HIGH (ref 4.0–10.5)
nRBC: 0 % (ref 0.0–0.2)

## 2018-11-21 LAB — RETIC PANEL
Immature Retic Fract: 14.1 % (ref 2.3–15.9)
RBC.: 3.13 MIL/uL — ABNORMAL LOW (ref 4.22–5.81)
Retic Count, Absolute: 46.6 10*3/uL (ref 19.0–186.0)
Retic Ct Pct: 1.5 % (ref 0.4–3.1)
Reticulocyte Hemoglobin: 22.5 pg — ABNORMAL LOW (ref >27.9)

## 2018-11-21 LAB — SAMPLE TO BLOOD BANK

## 2018-11-21 LAB — IRON AND TIBC
Iron: 5 ug/dL — ABNORMAL LOW (ref 45–182)
Saturation Ratios: 4 % — ABNORMAL LOW (ref 17.9–39.5)
TIBC: 117 ug/dL — ABNORMAL LOW (ref 250–450)
UIBC: 113 ug/dL

## 2018-11-21 LAB — FERRITIN: Ferritin: 1157 ng/mL — ABNORMAL HIGH (ref 24–336)

## 2018-11-21 NOTE — Telephone Encounter (Signed)
Belinda (RN) from [facility] called this morning to review lab work from the weekend and this morning.  She is concerned due to continued elevated white count.  [facility]'s WBC at [facility] was 29.  Hemoglobin 7.4.  Patient is scheduled to be seen by Dr. Tasia Catchings tomorrow for IV iron +/- PRBCs.  He is also scheduled to have blood work completed today. Will review labs and speak with Dr. Tasia Catchings.   Faythe Casa, NP 11/21/2018 10:35 AM

## 2018-11-22 ENCOUNTER — Other Ambulatory Visit: Payer: Self-pay

## 2018-11-22 ENCOUNTER — Inpatient Hospital Stay (HOSPITAL_BASED_OUTPATIENT_CLINIC_OR_DEPARTMENT_OTHER): Payer: Medicare Other | Admitting: Oncology

## 2018-11-22 ENCOUNTER — Encounter: Payer: Self-pay | Admitting: Oncology

## 2018-11-22 ENCOUNTER — Inpatient Hospital Stay: Payer: Medicare Other

## 2018-11-22 VITALS — BP 106/65 | HR 101 | Temp 97.3°F | Resp 18 | Wt 113.6 lb

## 2018-11-22 VITALS — BP 98/60 | HR 95 | Temp 98.0°F | Resp 18

## 2018-11-22 DIAGNOSIS — D72829 Elevated white blood cell count, unspecified: Secondary | ICD-10-CM

## 2018-11-22 DIAGNOSIS — D508 Other iron deficiency anemias: Secondary | ICD-10-CM

## 2018-11-22 DIAGNOSIS — R1310 Dysphagia, unspecified: Secondary | ICD-10-CM | POA: Diagnosis not present

## 2018-11-22 DIAGNOSIS — D696 Thrombocytopenia, unspecified: Secondary | ICD-10-CM

## 2018-11-22 DIAGNOSIS — Z8546 Personal history of malignant neoplasm of prostate: Secondary | ICD-10-CM

## 2018-11-22 DIAGNOSIS — D500 Iron deficiency anemia secondary to blood loss (chronic): Secondary | ICD-10-CM | POA: Diagnosis not present

## 2018-11-22 MED ORDER — IRON SUCROSE 20 MG/ML IV SOLN
200.0000 mg | Freq: Once | INTRAVENOUS | Status: AC
  Administered 2018-11-22: 10:00:00 200 mg via INTRAVENOUS
  Filled 2018-11-22: qty 10

## 2018-11-22 MED ORDER — SODIUM CHLORIDE 0.9 % IV SOLN
Freq: Once | INTRAVENOUS | Status: AC
  Administered 2018-11-22: 10:00:00 via INTRAVENOUS
  Filled 2018-11-22: qty 250

## 2018-11-22 NOTE — Progress Notes (Signed)
Per Dr. Tasia Catchings, proceed with iron infusion today, even with elevated ferritin.

## 2018-11-22 NOTE — Progress Notes (Signed)
Patient is on tube feedings and he says that this is causing him to have diarrhea.

## 2018-11-22 NOTE — Progress Notes (Signed)
Hematology/Oncology Follow Up Note [facility]  [phone] [phone]  Patient Care Team: Juluis Pitch, MD as PCP - General (Family Medicine)   Name of the patient: [name]  [id number]  20-Oct-1933   REASON FOR VISIT  follow-up for anemia   PERTINENT HEMATOLOGY HISTORY Patient was admitted from 09/19/2018 to 10/14/2018, dizziness, lightheadedness, status post fall, also febrile Was found to have left subdural hematoma, with left frontal cortex CVA. Initial febrile illness was considered to be secondary to tick bite.  Received full course of doxycycline. Developed dysphagia, status post PEG placement, postop course was complicated with PEG site infection.  Treated with antibiotics. Acute on chronic anemia with melena and a left subdural hematoma.  Patient was seen by gastroenterology and was recommended to have outpatient colonoscopy.  EGD 10/03/2018 showed nonbleeding duodenal ulcer. Patient was also seen by me, patient received 1 dose of IV Venofer treatment. Patient has a remote history of prostate cancer following up with urology. During this admission, PSA was found to be elevated at 12. At the presentation patient also presented with extremely high ferritin level, Which gradually decreased.  Likely reactive.  INTERVAL HISTORY 85 y.o. male presents for follow-up for anemia. Patient was last seen by me in the hospital.  Patient had followed up in the clinic for IV Venofer.  During 1 of the IV Venofer session, patient was found to have tachycardia, and being dehydrated.  Patient was seen by symptom management nurse practitioner and was given fluid.  Tachycardia improved Patient has blood work done earlier this week which showed white count of 24.3, predominantly neutrophilia, monocyte of 1.2, hemoglobin 8, MCV 81.8, platelet count 943,000.  Reticulocyte hemoglobin continues to be decreased at 22.5.  Patient was recently diagnosed as  UTI and patient was treated with IV Rocephin and a Z-Pak.  Patient has been afebrile since 11/15/2018.  Reports improving. [facility] 11/09/2018: 18,000 (WBC); 7.9 (hemoglobin) 11/11/2018: 19,500 (WBC); 7.4 11/14/2018: 15,300 (WBC); 7.1 11/16/2018: 21,000 (WBC); 7.9  Today patient reports having no no pain around his PEG tube sites. He reports he continues to have loose bowel movement for the past 2 weeks.  He feels that the tube feeding is not going well with his bowels.    Review of Systems  Constitutional: Positive for fatigue. Negative for appetite change, chills, fever and unexpected weight change.  HENT:   Negative for hearing loss and voice change.   Eyes: Negative for eye problems and icterus.  Respiratory: Negative for chest tightness, cough and shortness of breath.   Cardiovascular: Negative for chest pain and leg swelling.  Gastrointestinal: Positive for diarrhea. Negative for abdominal distention and abdominal pain.       PEG tube in place.    Endocrine: Negative for hot flashes.  Genitourinary: Negative for difficulty urinating, dysuria and frequency.   Musculoskeletal: Negative for arthralgias.  Skin: Negative for itching and rash.  Neurological: Negative for light-headedness and numbness.  Hematological: Negative for adenopathy. Bruises/bleeds easily.  Psychiatric/Behavioral: Negative for confusion.      No Known Allergies   Past Medical History:  Diagnosis Date  . Arthritis   . Cancer [facility])    Prostate Cancer  . Hypertension      Past Surgical History:  Procedure Laterality Date  . ESOPHAGOGASTRODUODENOSCOPY (EGD) WITH PROPOFOL N/A 12/06/2017   Procedure: ESOPHAGOGASTRODUODENOSCOPY (EGD) WITH PROPOFOL;  Surgeon: Jonathon Bellows, MD;  Location: [facility] ENDOSCOPY;  Service: Gastroenterology;  Laterality: N/A;  .  PEG PLACEMENT N/A 10/03/2018   Procedure: PERCUTANEOUS ENDOSCOPIC GASTROSTOMY (PEG) PLACEMENT;  Surgeon: Lucilla Lame, MD;   Location: [facility] ENDOSCOPY;  Service: Endoscopy;  Laterality: N/A;  . PROSTATE CRYOABLATION      Social History   Socioeconomic History  . Marital status: Widowed    Spouse name: Not on file  . Number of children: Not on file  . Years of education: Not on file  . Highest education level: Not on file  Occupational History  . Not on file  Social Needs  . Financial resource strain: Not on file  . Food insecurity    Worry: Not on file    Inability: Not on file  . Transportation needs    Medical: Not on file    Non-medical: Not on file  Tobacco Use  . Smoking status: Former Smoker    Types: Pipe    Quit date: 03/30/2002    Years since quitting: 16.6  . Smokeless tobacco: Never Used  Substance and Sexual Activity  . Alcohol use: Yes  . Drug use: Never  . Sexual activity: Not on file  Lifestyle  . Physical activity    Days per week: Not on file    Minutes per session: Not on file  . Stress: Not on file  Relationships  . Social Herbalist on phone: Not on file    Gets together: Not on file    Attends religious service: Not on file    Active member of club or organization: Not on file    Attends meetings of clubs or organizations: Not on file    Relationship status: Not on file  . Intimate partner violence    Fear of current or ex partner: Not on file    Emotionally abused: Not on file    Physically abused: Not on file    Forced sexual activity: Not on file  Other Topics Concern  . Not on file  Social History Narrative  . Not on file    History reviewed. No pertinent family history.   Current Outpatient Medications:  .  ferrous sulfate 300 (60 Fe) MG/5ML syrup, Place 5 mLs (300 mg total) into feeding tube daily., Disp: 150 mL, Rfl: 3 .  HYDROcodone-acetaminophen (NORCO/VICODIN) 5-325 MG tablet, Place 1 tablet into feeding tube every 6 (six) hours as needed for moderate pain., Disp: 15 tablet, Rfl: 0 .  neomycin-bacitracin-polymyxin (NEOSPORIN) ointment,  Apply topically daily., Disp: 15 g, Rfl: 0 .  Nutritional Supplements (FEEDING SUPPLEMENT, OSMOLITE 1.5 CAL,) LIQD, Place 237 mLs into feeding tube 6 (six) times daily., Disp: 1000 mL, Rfl: 3 .  polyethylene glycol (MIRALAX / GLYCOLAX) 17 g packet, Place 17 g into feeding tube daily as needed for mild constipation., Disp: 14 each, Rfl: 0 .  simethicone (MYLICON) 80 MG chewable tablet, Chew 80 mg by mouth every 6 (six) hours as needed for flatulence., Disp: , Rfl:  .  tamsulosin (FLOMAX) 0.4 MG CAPS capsule, Take 1 capsule (0.4 mg total) by mouth daily., Disp: 30 capsule, Rfl: 11 .  vitamin B-12 (CYANOCOBALAMIN) 500 MCG tablet, Place 1 tablet (500 mcg total) into feeding tube daily., Disp:  , Rfl:  .  vitamin C (VITAMIN C) 250 MG tablet, Place 1 tablet (250 mg total) into feeding tube 2 (two) times daily., Disp:  , Rfl:  .  Water For Irrigation, Sterile (FREE WATER) SOLN, Place 75 mLs into feeding tube every 4 (four) hours., Disp:  , Rfl:  .  Water  For Irrigation, Sterile (FREE WATER) SOLN, Place 30 mLs into feeding tube 6 (six) times daily., Disp:  , Rfl:  .  acetaminophen (TYLENOL) 325 MG tablet, Place 2 tablets (650 mg total) into feeding tube every 6 (six) hours as needed for mild pain. (Patient not taking: Reported on 11/22/2018), Disp:  , Rfl:  .  cephALEXin (KEFLEX) 500 MG capsule, Place 1 capsule (500 mg total) into feeding tube every 6 (six) hours. (Patient not taking: Reported on 11/22/2018), Disp: 24 capsule, Rfl: 0 .  guaiFENesin-dextromethorphan (ROBITUSSIN DM) 100-10 MG/5ML syrup, Place 5 mLs into feeding tube every 6 (six) hours as needed for cough. (Patient not taking: Reported on 11/22/2018), Disp: 118 mL, Rfl: 0 .  metoprolol tartrate (LOPRESSOR) 25 MG tablet, Place 1 tablet (25 mg total) into feeding tube 2 (two) times daily. (Patient not taking: Reported on 11/22/2018), Disp:  , Rfl:  .  nystatin (MYCOSTATIN/NYSTOP) powder, Apply topically 3 (three) times daily. (Patient not taking:  Reported on 11/22/2018), Disp: 30 g, Rfl: 0 .  ondansetron (ZOFRAN) 4 MG tablet, Place 1 tablet (4 mg total) into feeding tube every 6 (six) hours as needed for nausea. (Patient not taking: Reported on 11/22/2018), Disp: 20 tablet, Rfl: 0  Physical exam:  Vitals:   11/22/18 0857  BP: 106/65  Pulse: (!) 101  Resp: 18  Temp: (!) 97.3 F (36.3 C)  Weight: 113 lb 9.6 oz (51.5 kg)   Physical Exam Constitutional:      General: He is not in acute distress.    Appearance: He is ill-appearing.     Comments: Thin, sitting in wheelchair.  HENT:     Head: Normocephalic and atraumatic.  Eyes:     General: No scleral icterus.    Pupils: Pupils are equal, round, and reactive to light.  Neck:     Musculoskeletal: Normal range of motion and neck supple.  Cardiovascular:     Rate and Rhythm: Normal rate and regular rhythm.     Heart sounds: Normal heart sounds.  Pulmonary:     Effort: Pulmonary effort is normal. No respiratory distress.     Breath sounds: No wheezing.  Abdominal:     General: Bowel sounds are normal. There is no distension.     Palpations: Abdomen is soft. There is no mass.     Tenderness: There is no abdominal tenderness.     Comments: PEG tube in place.  Musculoskeletal: Normal range of motion.        General: No deformity.  Skin:    General: Skin is warm and dry.     Findings: No erythema or rash.  Neurological:     Mental Status: He is alert and oriented to person, place, and time.     Cranial Nerves: No cranial nerve deficit.  Psychiatric:        Behavior: Behavior normal.        Thought Content: Thought content normal.     CMP Latest Ref Rng & Units 10/14/2018  Glucose 70 - 99 mg/dL 147(H)  BUN 8 - 23 mg/dL 20  Creatinine 0.61 - 1.24 mg/dL 0.65  Sodium 135 - 145 mmol/L 135  Potassium 3.5 - 5.1 mmol/L 4.5  Chloride 98 - 111 mmol/L 101  CO2 22 - 32 mmol/L 26  Calcium 8.9 - 10.3 mg/dL 8.7(L)  Total Protein 6.5 - 8.1 g/dL -  Total Bilirubin 0.3 - 1.2 mg/dL -   Alkaline Phos 38 - 126 U/L -  AST 15 - 41  U/L -  ALT 0 - 44 U/L -   CBC Latest Ref Rng & Units 11/21/2018  WBC 4.0 - 10.5 K/uL 24.3(H)  Hemoglobin 13.0 - 17.0 g/dL 8.0(L)  Hematocrit 39.0 - 52.0 % 25.6(L)  Platelets 150 - 400 K/uL 943(HH)    No results found.   Assessment and plan 1. Other iron deficiency anemia   2. Thrombocytopenia (HCC)   3. Leukocytosis, unspecified type   4. Dysphagia, unspecified type   5. History of prostate cancer    #Labs are reviewed and discussed with patient.  I attempted to call daughter twice during the clinic and 2 times after the clinic not able to reach her. Labs shows persistent anemia with hemoglobin of 8, stable comparing to few weeks ago. Reticulocyte hemoglobin remains low at 22.5, consistent with underlying iron deficiency. Patient's iron panel was reviewed.  He has low iron saturation at 4.  Increased ferritin can be secondary to acute phase reactant and falsely elevated due to recent infection. Recommend patient to proceed with Venofer 200 mg x 1, repeat another dose in 1 week.   #Leukocytosis and thrombocytosis Reactive versus underlying bone marrow processes. Given patient's recent UTI, leukocytosis can be secondary to acute infection. Patient also has had chronic diarrhea which can be infectious versus malabsorption. Recommend patient to have C. difficile checked at rehab facility. Thrombocytosis can be reactive due to acute infection/iron deficiency or underlying bone marrow process.  I recommend close monitoring patient's counts in the next 1 to 2 weeks.  Continue IV iron treatments. If persistently elevated with no improvement despite being fully recovered from underlying infection, need to consider proceeding with bone marrow biopsy. Check Jak2 mutation with reflex,   Elevated PSA, patient will need additional work-up including CT pelvis.  Patient supposed to follow-up with urology   #Dysphagia, continue PEG tube feeding.   Follow-up in 2 weeks Orders Placed This Encounter  Procedures  . PSA    Standing Status:   Future    Standing Expiration Date:   11/22/2019  . Comprehensive metabolic panel    Standing Status:   Future    Standing Expiration Date:   11/22/2019  . CBC with Differential/Platelet    Standing Status:   Future    Standing Expiration Date:   11/22/2019  . Ferritin    Standing Status:   Future    Standing Expiration Date:   11/22/2019  . Iron and TIBC    Standing Status:   Future    Standing Expiration Date:   11/22/2019    We spent sufficient time to discuss many aspect of care, questions were answered to patient's satisfaction. Earlie Server, MD, PhD Hematology Oncology [place] at [facility] 11/22/2018

## 2018-11-23 ENCOUNTER — Inpatient Hospital Stay: Payer: Medicare Other

## 2018-11-23 ENCOUNTER — Inpatient Hospital Stay: Payer: Medicare Other | Admitting: Oncology

## 2018-11-24 ENCOUNTER — Other Ambulatory Visit: Payer: Self-pay

## 2018-11-24 ENCOUNTER — Ambulatory Visit: Payer: Medicare Other | Admitting: Gastroenterology

## 2018-11-24 ENCOUNTER — Encounter: Payer: Self-pay | Admitting: Gastroenterology

## 2018-11-24 ENCOUNTER — Ambulatory Visit: Payer: Medicare Other

## 2018-11-24 ENCOUNTER — Ambulatory Visit: Payer: Medicare Other | Admitting: Family Medicine

## 2018-11-24 DIAGNOSIS — R339 Retention of urine, unspecified: Secondary | ICD-10-CM

## 2018-11-24 NOTE — Progress Notes (Signed)
Catheter Removal  Patient is present today for a catheter removal.  80ml of water was drained from the balloon. A 16Coude foley cath was removed from the bladder no complications were noted . Patient tolerated well.  Performed by: Elberta Leatherwood, CMA  Follow up/ Additional notes: Return this afternoon for PVR.  Patient returned for bladder scan the residual was 25ML.  Patient is to follow up in 3 months

## 2018-11-25 ENCOUNTER — Telehealth: Payer: Self-pay | Admitting: Urology

## 2018-11-25 NOTE — Telephone Encounter (Signed)
Belinda from [facility] called and wanted to notify that pt's cath had to be placed again last night. He was unable to void. She would like a call back. [phone]

## 2018-11-25 NOTE — Telephone Encounter (Signed)
Spoke to [place] at [facility] [employer] and she states patient was unable to urinate last night. She did an in and out cath to relieve his bladder and did it again at 2:00 pm. She was requesting we put the foley back in. I gave verbal to have 16 fr catheter placed and he is to come back on 12/08/2018 for a voiding trial again.

## 2018-11-28 ENCOUNTER — Telehealth: Payer: Self-pay | Admitting: *Deleted

## 2018-11-28 NOTE — Telephone Encounter (Signed)
A resident at the the facility where this patient resides has tested positive for COVID 19 and she is asking if the appointment for tomorrow could be changed to a virtual or telephone visit. I discussed with Almyra Free, CMA that this appointment is for lab/ iron infusion and she said to have the appointment rescheduled for 2 weeks from now. I returned call to [facility] and advised she call and reschedule this appointment for 2 weeks and she stated she will call to reschedule it

## 2018-11-29 ENCOUNTER — Inpatient Hospital Stay: Payer: Medicare Other

## 2018-12-02 ENCOUNTER — Encounter: Payer: Self-pay | Admitting: Nurse Practitioner

## 2018-12-02 ENCOUNTER — Other Ambulatory Visit: Payer: Self-pay

## 2018-12-02 ENCOUNTER — Ambulatory Visit (INDEPENDENT_AMBULATORY_CARE_PROVIDER_SITE_OTHER): Payer: Medicare Other | Admitting: Nurse Practitioner

## 2018-12-02 DIAGNOSIS — R339 Retention of urine, unspecified: Secondary | ICD-10-CM

## 2018-12-02 DIAGNOSIS — D508 Other iron deficiency anemias: Secondary | ICD-10-CM | POA: Diagnosis not present

## 2018-12-02 DIAGNOSIS — Z9181 History of falling: Secondary | ICD-10-CM

## 2018-12-02 DIAGNOSIS — Z87828 Personal history of other (healed) physical injury and trauma: Secondary | ICD-10-CM | POA: Diagnosis not present

## 2018-12-02 DIAGNOSIS — D696 Thrombocytopenia, unspecified: Secondary | ICD-10-CM

## 2018-12-02 DIAGNOSIS — I951 Orthostatic hypotension: Secondary | ICD-10-CM | POA: Insufficient documentation

## 2018-12-02 DIAGNOSIS — R1310 Dysphagia, unspecified: Secondary | ICD-10-CM | POA: Diagnosis not present

## 2018-12-02 DIAGNOSIS — Z978 Presence of other specified devices: Secondary | ICD-10-CM

## 2018-12-02 DIAGNOSIS — Z8546 Personal history of malignant neoplasm of prostate: Secondary | ICD-10-CM

## 2018-12-02 DIAGNOSIS — Z960 Presence of urogenital implants: Secondary | ICD-10-CM

## 2018-12-02 DIAGNOSIS — R638 Other symptoms and signs concerning food and fluid intake: Secondary | ICD-10-CM | POA: Insufficient documentation

## 2018-12-02 DIAGNOSIS — R972 Elevated prostate specific antigen [PSA]: Secondary | ICD-10-CM

## 2018-12-02 DIAGNOSIS — M1990 Unspecified osteoarthritis, unspecified site: Secondary | ICD-10-CM | POA: Insufficient documentation

## 2018-12-02 DIAGNOSIS — D72829 Elevated white blood cell count, unspecified: Secondary | ICD-10-CM | POA: Insufficient documentation

## 2018-12-02 DIAGNOSIS — I10 Essential (primary) hypertension: Secondary | ICD-10-CM | POA: Insufficient documentation

## 2018-12-02 NOTE — Assessment & Plan Note (Signed)
With current PEG in place and receiving all feeds through this.  Continue to collaborate with GI team.  CCM referral at this time to initiate team approach.  May benefit from home health and palliative referral upon rehab discharge.

## 2018-12-02 NOTE — Addendum Note (Signed)
Addended by: Marnee Guarneri T on: 12/02/2018 01:28 PM   Modules accepted: Orders

## 2018-12-02 NOTE — Progress Notes (Signed)
New Patient Office Visit  Subjective:  Patient ID: [name], male    DOB: 05-15-1933  Age: 85 y.o. MRN: [id number]  CC:  Chief Complaint  Patient presents with  . Establish Care    pt currently in [facility] for rehab, discharge to be determined per daughter    . This visit was completed via telephone due to the restrictions of the COVID-19 pandemic. All issues as above were discussed and addressed but no physical exam was performed. If it was felt that the patient should be evaluated in the office, they were directed there. The patient verbally consented to this visit. Patient was unable to complete an audio/visual visit due to Lack of equipment. Due to the catastrophic nature of the COVID-19 pandemic, this visit was done through audio contact only. . Location of the patient: home . Location of the provider: home . Those involved with this call:  . Provider: Marnee Guarneri, DNP . CMA: Yvonna Alanis, CMA . Front Desk/Registration: Jill Side  . Time spent on call: 30 minutes on the phone discussing health concerns. 15 minutes total spent in review of patient's record and preparation of their chart.  . I verified patient identity using two factors (patient name and date of birth). Patient consents verbally to being seen via telemedicine visit today.    HPI [name] presents for new patient visit to establish care.  Introduced to Designer, jewellery role and practice setting.  All questions answered.  His daughter, [name], was on call via conference call to assist in providing history. Patient is currently admitted to [facility] for rehab, at this time he is finished his PT/OT sessions.  He was admitted to hospital, [facility], on 09/19/2018 after a fall at his home, has been having dizziness and feeling light-headed prior to fall.  Subsequently, had left subdural hematoma, last scan 10/18/18 measured 4 mm and was previously 8 mm.  Was noted to have orthostatic BP readings in  hospital and HTN medication regimen was changed.  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    He is a Primary school teacher, served for 10 years.  No combat time.    Function: currently able to use wheelchair and walker, using wheelchair when up and about, self propels.  His daughter reports he lives in mobile home with steps going up into it, which patient reports he could walk up, there are rails on both sides.  Has shower/tub combo and his family has purchased a shower chair and rails to help with this.  DYSPHAGIA: Has been followed for this since 11/24/17, followed by GI.  Scheduled for a repeat swallow test on 12/22/2018.  During recent hospitalization he failed swallow tests and had a PEG placed on 10/03/2018, at this time is receiving Osmolite five times a day via PEG.  While hospitalized he did have infection to PEG tube site, which has since resolved and he denies any tenderness, redness, or drainage to site.  Reports the nurses at [facility] change the PEG dressing daily.  He was seen by palliative team while in hospital and it was recommended they follow outpatient as well, which was discussed with patient and daughter today.  They are interested in continuing palliative once patient discharged home.  He reports his goal is not to keep feeding tube as he misses eating food. Followed by GI and last seen in hospital.  PROSTATE CANCER: Back in 2003 and  had cryoablation with Dr. Jacqlyn Larsen and was told cancer was "cured". Recently in hospital his PSA was elevated in the setting of having foley catheter in place, on 09/28/2018 was 12.8.  He is currently being followed by [facility] with urology for ongoing foley catheter and urinary retention, last saw 10/27/18 and was started on Flomax. Recently they did trial removal of catheter, but patient was unable to void and foley was placed back in.  He is scheduled to have further assessment 12/08/18 with  another void trial.  Plan is to repeat PSA with urology at end of October.  IRON DEFICIENCY ANEMIA: Currently followed by Dr. Tasia Catchings for anemia and is receiving IV Venofer, last 11/22/2018.  Has also been noted to have elevation in WBC, which they are following.  On 11/16/2018 WBC 21,000 and HB 7.9.  Did have UTI with treatment in mid-August at [facility], treated with Rocephin.  On review of notes plan is to monitor WBC closely and if no improvement despite no infection, may consider bone marrow biopsy or CT pelvis with elevated PSA.    Past Medical History:  Diagnosis Date  . Arthritis   . Cancer [facility])    Prostate Cancer  . Dysphagia   . Hypertension   . Stroke ([place])   . Subdural hematoma [facility])     Past Surgical History:  Procedure Laterality Date  . ESOPHAGOGASTRODUODENOSCOPY (EGD) WITH PROPOFOL N/A 12/06/2017   Procedure: ESOPHAGOGASTRODUODENOSCOPY (EGD) WITH PROPOFOL;  Surgeon: Jonathon Bellows, MD;  Location: [facility] ENDOSCOPY;  Service: Gastroenterology;  Laterality: N/A;  . PEG PLACEMENT N/A 10/03/2018   Procedure: PERCUTANEOUS ENDOSCOPIC GASTROSTOMY (PEG) PLACEMENT;  Surgeon: Lucilla Lame, MD;  Location: [facility] ENDOSCOPY;  Service: Endoscopy;  Laterality: N/A;  . PROSTATE CRYOABLATION    . testical removal      Family History  Problem Relation Age of Onset  . Cancer Mother        breast  . Heart attack Father   . Heart disease Father   . Anxiety disorder Daughter   . Hyperlipidemia Daughter     Social History   Socioeconomic History  . Marital status: Widowed    Spouse name: Not on file  . Number of children: Not on file  . Years of education: Not on file  . Highest education level: Not on file  Occupational History  . Not on file  Social Needs  . Financial resource strain: Not on file  . Food insecurity    Worry: Not on file    Inability: Not on file  . Transportation needs    Medical: Not on file    Non-medical: Not on file  Tobacco Use  . Smoking status: Former Smoker     Types: Pipe    Quit date: 03/30/2002    Years since quitting: 16.6  . Smokeless tobacco: Never Used  Substance and Sexual Activity  . Alcohol use: Not Currently    Comment: currently NPO  . Drug use: Never  . Sexual activity: Not Currently  Lifestyle  . Physical activity    Days per week: Not on file    Minutes per session: Not on file  . Stress: Not on file  Relationships  . Social Herbalist on phone: Not on file    Gets together: Not on file    Attends religious service: Not on file    Active member of club or organization: Not on file    Attends meetings of clubs or organizations: Not on file  Relationship status: Not on file  . Intimate partner violence    Fear of current or ex partner: Not on file    Emotionally abused: Not on file    Physically abused: Not on file    Forced sexual activity: Not on file  Other Topics Concern  . Not on file  Social History Narrative  . Not on file    ROS Review of Systems  Constitutional: Negative for activity change, diaphoresis, fatigue and fever.  Respiratory: Negative for cough, chest tightness, shortness of breath and wheezing.   Cardiovascular: Negative for chest pain, palpitations and leg swelling.  Gastrointestinal: Negative for abdominal distention, abdominal pain, constipation, diarrhea, nausea and vomiting.  Endocrine: Negative for cold intolerance.  Neurological: Negative for dizziness, syncope, weakness, light-headedness, numbness and headaches.  Psychiatric/Behavioral: Negative.     Objective:   Today's Vitals: There were no vitals taken for this visit.  Physical Exam   Unable to perform, telephone visit only.  Patient currently admitted to [facility] for rehab.  Assessment & Plan:   Problem List Items Addressed This Visit      Cardiovascular and Mediastinum   Orthostatic hypotension    Monitor BP closely on visits and adjust medications as needed.  High fall risk. Will plan on follow-up in  office once discharged from [facility].  CCM referral at this time to initiate team approach.  Would benefit from home health and palliative team involvement.         Digestive   Dysphagia    Continue to collaborate with GI.  Currently receiving all feeds via PEG Osmolite five times a day.  Monitor for s/s infection and treat accordingly if present.  Will plan on follow-up in office once discharged from [facility].  CCM referral at this time to initiate team approach.      Relevant Orders   Ambulatory referral to Chronic Care Management Services     Genitourinary   Urinary retention    Followed by urology and started on Flomax.  Has foley in place. Continue to collaborate with urology team.  Monitor for s/s infection with foley.        Relevant Orders   Ambulatory referral to Chronic Care Management Services     Other   Thrombocytopenia [facility])    Being followed by hematology, continue to collaborate with them and review notes.      Elevated PSA    Currently with foley in place.  Followed by urology, continue to collaborate with them and review notes.  To have repeat PSA with urology at end of October.      IDA (iron deficiency anemia)    Continue to collaborate with hematology group.  Currently receiving IV iron transfusions.      History of subdural hematoma (post traumatic)    From fall in June.  Last scan 10/18/2018 showing decrease in size.  Monitor closely, high fall risk.  Plan on follow-up with patient once discharged from [facility].  May consider neurology referral.      Relevant Orders   Ambulatory referral to Chronic Care Management Services   History of prostate cancer    Followed by urology, continue to collaborate with them and review notes.      Foley catheter present    Currently in place, secondary to urinary retention.  Continue to collaborate with urology team.  Will plan on follow-up in office once discharged from [facility].  CCM referral at this time to initiate team  approach.  May benefit  from home health and palliative referral upon rehab discharge.      Relevant Orders   Ambulatory referral to Chronic Care Management Services   Alteration in nutrition associated with tube feeding    With current PEG in place and receiving all feeds through this.  Continue to collaborate with GI team.  CCM referral at this time to initiate team approach.  May benefit from home health and palliative referral upon rehab discharge.      At high risk for falls    Secondary to frailty with current conditions.  CCM referral at this time to initiate team approach.  May benefit from home health and palliative referral upon rehab discharge.         Outpatient Encounter Medications as of 12/02/2018  Medication Sig  . acetaminophen (TYLENOL) 325 MG tablet Place 2 tablets (650 mg total) into feeding tube every 6 (six) hours as needed for mild pain.  . ferrous sulfate 300 (60 Fe) MG/5ML syrup Place 5 mLs (300 mg total) into feeding tube daily.  . Multiple Vitamin (MULTIVITAMIN) tablet 1 tablet. Take 1 tablet through feeding tube daily  . Nutritional Supplements (FEEDING SUPPLEMENT, OSMOLITE 1.5 CAL,) LIQD Place 237 mLs into feeding tube 6 (six) times daily.  . ondansetron (ZOFRAN) 4 MG tablet Place 1 tablet (4 mg total) into feeding tube every 6 (six) hours as needed for nausea.  Marland Kitchen saccharomyces boulardii (FLORASTOR) 250 MG capsule Take 1 tablet through feeding tube BID  . simethicone (MYLICON) 80 MG chewable tablet Chew 80 mg by mouth every 6 (six) hours as needed for flatulence.  . tamsulosin (FLOMAX) 0.4 MG CAPS capsule Take 1 capsule (0.4 mg total) by mouth daily.  . vitamin B-12 (CYANOCOBALAMIN) 500 MCG tablet Place 1 tablet (500 mcg total) into feeding tube daily.  . cephALEXin (KEFLEX) 500 MG capsule Place 1 capsule (500 mg total) into feeding tube every 6 (six) hours. (Patient not taking: Reported on 11/22/2018)  . guaiFENesin-dextromethorphan (ROBITUSSIN DM) 100-10 MG/5ML  syrup Place 5 mLs into feeding tube every 6 (six) hours as needed for cough. (Patient not taking: Reported on 12/02/2018)  . HYDROcodone-acetaminophen (NORCO/VICODIN) 5-325 MG tablet Place 1 tablet into feeding tube every 6 (six) hours as needed for moderate pain. (Patient not taking: Reported on 12/02/2018)  . metoprolol tartrate (LOPRESSOR) 25 MG tablet Place 1 tablet (25 mg total) into feeding tube 2 (two) times daily. (Patient not taking: Reported on 11/22/2018)  . neomycin-bacitracin-polymyxin (NEOSPORIN) ointment Apply topically daily. (Patient not taking: Reported on 12/02/2018)  . nystatin (MYCOSTATIN/NYSTOP) powder Apply topically 3 (three) times daily. (Patient not taking: Reported on 11/22/2018)  . polyethylene glycol (MIRALAX / GLYCOLAX) 17 g packet Place 17 g into feeding tube daily as needed for mild constipation. (Patient not taking: Reported on 12/02/2018)  . vitamin C (VITAMIN C) 250 MG tablet Place 1 tablet (250 mg total) into feeding tube 2 (two) times daily. (Patient not taking: Reported on 12/02/2018)  . Water For Irrigation, Sterile (FREE WATER) SOLN Place 75 mLs into feeding tube every 4 (four) hours. (Patient not taking: Reported on 12/02/2018)  . Water For Irrigation, Sterile (FREE WATER) SOLN Place 30 mLs into feeding tube 6 (six) times daily. (Patient not taking: Reported on 12/02/2018)   No facility-administered encounter medications on file as of 12/02/2018.    I discussed the assessment and treatment plan with the patient. The patient was provided an opportunity to ask questions and all were answered. The patient agreed with the plan and  demonstrated an understanding of the instructions.   The patient was advised to call back or seek an in-person evaluation if the symptoms worsen or if the condition fails to improve as anticipated.   I provided 30 minutes of time during this encounter.  Follow-up: Return for family to call, will plan on follow-up upon discharge from [facility].   Venita Lick, NP

## 2018-12-02 NOTE — Assessment & Plan Note (Signed)
Followed by urology, continue to collaborate with them and review notes.

## 2018-12-02 NOTE — Patient Instructions (Signed)
° °Dysphagia ° °Dysphagia is trouble swallowing. This condition occurs when solids and liquids stick in a person's throat on the way down to the stomach, or when food takes longer to get to the stomach. You may have problems swallowing food, liquids, or both. You may also have pain while trying to swallow. It may take you more time and effort to swallow something. °What are the causes? °This condition is caused by: °· Problems with the muscles. They may make it difficult for you to move food and liquids through the tube that connects your mouth to your stomach (esophagus). You may have ulcers, scar tissue, or inflammation that blocks the normal passage of food and liquids. Causes of these problems include: °? Acid reflux from your stomach into your esophagus (gastroesophageal reflux). °? Infections. °? Radiation treatment for cancer. °? Medicines taken without enough fluids to wash them down into your stomach. °· Nerve problems. These prevent signals from being sent to the muscles of your esophagus to squeeze (contract) and move what you swallow down to your stomach. °· Globus pharyngeus. This is a common problem that involves feeling like something is stuck in the throat or a sense of trouble with swallowing even though nothing is wrong with the swallowing passages. °· Stroke. This can affect the nerves and make it difficult to swallow. °· Certain conditions, such as cerebral palsy or Parkinson disease. °What are the signs or symptoms? °Common symptoms of this condition include: °· A feeling that solids or liquids are stuck in your throat on the way down to the stomach. °· Food taking too long to get to the stomach. °Other symptoms include: °· Food moving back from your stomach to your mouth (regurgitation). °· Noises coming from your throat. °· Chest discomfort with swallowing. °· A feeling of fullness when swallowing. °· Drooling, especially when the throat is blocked. °· Pain while  swallowing. °· Heartburn. °· Coughing or gagging while trying to swallow. °How is this diagnosed? °This condition is diagnosed by: °· Barium X-ray. In this test, you swallow a white substance (contrast medium)that sticks to the inside of your esophagus. X-ray images are then taken. °· Endoscopy. In this test, a flexible telescope is inserted down your throat to look at your esophagus and your stomach. °· CT scans and MRI. °How is this treated? °Treatment for dysphagia depends on the cause of the condition: °· If the dysphagia is caused by acid reflux or infection, medicines may be used. They may include antibiotics and heartburn medicines. °· If the dysphagia is caused by problems with your muscles, swallowing therapy may be used to help you strengthen your swallowing muscles. You may have to do specific exercises to strengthen the muscles or stretch them. °· If the dysphagia is caused by a blockage or mass, procedures to remove the blockage may be done. You may need surgery and a feeding tube. °You may need to make diet changes. Ask your health care provider for specific instructions. °Follow these instructions at home: °Eating and drinking °· Try to eat soft food that is easier to swallow. °· Follow any diet changes as told by your health care provider. °· Cut your food into small pieces and eat slowly. °· Eat and drink only when you are sitting upright. °· Do not drink alcohol or caffeine. If you need help quitting, ask your health care provider. °General instructions °· Check your weight every day to make sure you are not losing weight. °· Take over-the-counter and prescription medicines   only as told by your health care provider. °· If you were prescribed an antibiotic medicine, take it as told by your health care provider. Do not stop taking the antibiotic even if you start to feel better. °· Do not use any products that contain nicotine or tobacco, such as cigarettes and e-cigarettes. If you need help  quitting, ask your health care provider. °· Keep all follow-up visits as told by your health care provider. This is important. °Contact a health care provider if: °· You lose weight because you cannot swallow. °· You cough when you drink liquids (aspiration). °· You cough up partially digested food. °Get help right away if: °· You cannot swallow your saliva. °· You have shortness of breath or a fever, or both. °· You have a hoarse voice and also have trouble swallowing. °Summary °· Dysphagia is trouble swallowing. This condition occurs when solids and liquids stick in a person's throat on the way down to the stomach, or when food takes longer to get to the stomach. °· Dysphagia has many possible causes and symptoms. °· Treatment for dysphagia depends on the cause of the condition. °This information is not intended to replace advice given to you by your health care provider. Make sure you discuss any questions you have with your health care provider. °Document Released: 03/13/2000 Document Revised: 02/26/2017 Document Reviewed: 03/05/2016 °Elsevier Patient Education © 2020 [employer] Inc[employer] ° ° °

## 2018-12-02 NOTE — Assessment & Plan Note (Signed)
Currently with foley in place.  Followed by urology, continue to collaborate with them and review notes.  To have repeat PSA with urology at end of October.

## 2018-12-02 NOTE — Assessment & Plan Note (Addendum)
Currently in place, secondary to urinary retention.  Continue to collaborate with urology team.  Will plan on follow-up in office once discharged from [facility].  CCM referral at this time to initiate team approach.  May benefit from home health and palliative referral upon rehab discharge.

## 2018-12-02 NOTE — Assessment & Plan Note (Addendum)
Monitor BP closely on visits and adjust medications as needed.  High fall risk. Will plan on follow-up in office once discharged from [facility].  CCM referral at this time to initiate team approach.  Would benefit from home health and palliative team involvement.

## 2018-12-02 NOTE — Assessment & Plan Note (Signed)
Continue to collaborate with hematology group.  Currently receiving IV iron transfusions.

## 2018-12-02 NOTE — Assessment & Plan Note (Signed)
Followed by urology and started on Flomax.  Has foley in place. Continue to collaborate with urology team.  Monitor for s/s infection with foley.

## 2018-12-02 NOTE — Assessment & Plan Note (Signed)
From fall in June.  Last scan 10/18/2018 showing decrease in size.  Monitor closely, high fall risk.  Plan on follow-up with patient once discharged from [facility].  May consider neurology referral.

## 2018-12-02 NOTE — Assessment & Plan Note (Signed)
Secondary to frailty with current conditions.  CCM referral at this time to initiate team approach.  May benefit from home health and palliative referral upon rehab discharge.

## 2018-12-02 NOTE — Assessment & Plan Note (Signed)
Being followed by hematology, continue to collaborate with them and review notes.

## 2018-12-02 NOTE — Assessment & Plan Note (Signed)
Continue to collaborate with GI.  Currently receiving all feeds via PEG Osmolite five times a day.  Monitor for s/s infection and treat accordingly if present.  Will plan on follow-up in office once discharged from [facility].  CCM referral at this time to initiate team approach.

## 2018-12-06 ENCOUNTER — Other Ambulatory Visit: Payer: Medicare Other

## 2018-12-07 ENCOUNTER — Ambulatory Visit: Payer: Medicare Other | Admitting: Oncology

## 2018-12-07 ENCOUNTER — Telehealth: Payer: Self-pay | Admitting: Urology

## 2018-12-07 ENCOUNTER — Ambulatory Visit: Payer: Medicare Other

## 2018-12-07 NOTE — Telephone Encounter (Signed)
2 options. Can wait a few weeks and come in for foley removal and bladder scan in office, or take 1 day Bactrim DS BID at facililty to sterilize urine and have them remove foley, with pVR in clinic as soon as he is able in the next few weeks to confirm he is emptying. I am ok with either option  Nickolas Madrid, MD 12/07/2018

## 2018-12-07 NOTE — Telephone Encounter (Signed)
Pt's daughter called and states that Pt can not come for V&T due to [place] exposure. The nurse at [facility] states that she will need an order if they have to remove foley, but they can't do a bladder scan afterwards. The daughter would like a call back to discuss options.

## 2018-12-07 NOTE — Telephone Encounter (Signed)
Spoke with patient's daughter and notified her and she would like to wait a few weeks and come in. Apt was previously made. She did state patient was noted to have a 99.8 temp today and some nausea. Patient is waiting for Covid results taken on Monday. She will call back tomorrow to update if fever has increased

## 2018-12-07 NOTE — Telephone Encounter (Signed)
Is this ok to give a verbal order?

## 2018-12-08 ENCOUNTER — Telehealth: Payer: Self-pay | Admitting: Urology

## 2018-12-08 ENCOUNTER — Encounter: Payer: Self-pay | Admitting: Oncology

## 2018-12-08 ENCOUNTER — Ambulatory Visit: Payer: Medicare Other

## 2018-12-08 ENCOUNTER — Other Ambulatory Visit: Payer: Self-pay

## 2018-12-08 DIAGNOSIS — R339 Retention of urine, unspecified: Secondary | ICD-10-CM

## 2018-12-08 MED ORDER — SILODOSIN 8 MG PO CAPS: 8.0000 mg | ORAL_CAPSULE | Freq: Every day | ORAL | 11 refills | Status: AC

## 2018-12-08 NOTE — Telephone Encounter (Signed)
RX printed and faxed to nursing facility. Called Belinda and informed her of the change and fax, she voiced understanding.

## 2018-12-08 NOTE — Telephone Encounter (Signed)
That's fine  Nickolas Madrid, MD 12/08/2018

## 2018-12-08 NOTE — Telephone Encounter (Signed)
[name] from [facility] LMOM and states that pt's Flomax is clogging his Gtube and she would like to know if it can be changed to [place].  Ph# [phone]

## 2018-12-09 ENCOUNTER — Inpatient Hospital Stay: Payer: Medicare Other

## 2018-12-12 ENCOUNTER — Inpatient Hospital Stay: Payer: Medicare Other

## 2018-12-12 ENCOUNTER — Inpatient Hospital Stay: Payer: Medicare Other | Admitting: Oncology

## 2018-12-14 ENCOUNTER — Telehealth: Payer: Self-pay | Admitting: Nurse Practitioner

## 2018-12-14 NOTE — Telephone Encounter (Signed)
General HIPAA compliant message left with Mendel Ryder for orders.

## 2018-12-14 NOTE — Telephone Encounter (Signed)
Ria Comment calling with [facility] called and stated that she would like verbals to start care for skilled nursing.Please advise   2x2 1x3 3 as needed

## 2018-12-16 ENCOUNTER — Telehealth: Payer: Self-pay | Admitting: Nurse Practitioner

## 2018-12-16 ENCOUNTER — Other Ambulatory Visit: Payer: Self-pay

## 2018-12-16 ENCOUNTER — Encounter: Payer: Self-pay | Admitting: Nurse Practitioner

## 2018-12-16 ENCOUNTER — Other Ambulatory Visit: Payer: Medicare Other

## 2018-12-16 ENCOUNTER — Ambulatory Visit (INDEPENDENT_AMBULATORY_CARE_PROVIDER_SITE_OTHER): Payer: Medicare Other | Admitting: Nurse Practitioner

## 2018-12-16 VITALS — BP 92/50 | HR 98 | Temp 97.6°F

## 2018-12-16 DIAGNOSIS — Z9189 Other specified personal risk factors, not elsewhere classified: Secondary | ICD-10-CM | POA: Insufficient documentation

## 2018-12-16 DIAGNOSIS — Z66 Do not resuscitate: Secondary | ICD-10-CM | POA: Insufficient documentation

## 2018-12-16 DIAGNOSIS — Z978 Presence of other specified devices: Secondary | ICD-10-CM

## 2018-12-16 DIAGNOSIS — R627 Adult failure to thrive: Secondary | ICD-10-CM | POA: Diagnosis not present

## 2018-12-16 DIAGNOSIS — R638 Other symptoms and signs concerning food and fluid intake: Secondary | ICD-10-CM

## 2018-12-16 DIAGNOSIS — Z9181 History of falling: Secondary | ICD-10-CM

## 2018-12-16 DIAGNOSIS — Z8546 Personal history of malignant neoplasm of prostate: Secondary | ICD-10-CM

## 2018-12-16 DIAGNOSIS — R972 Elevated prostate specific antigen [PSA]: Secondary | ICD-10-CM

## 2018-12-16 DIAGNOSIS — Z960 Presence of urogenital implants: Secondary | ICD-10-CM

## 2018-12-16 DIAGNOSIS — R1310 Dysphagia, unspecified: Secondary | ICD-10-CM

## 2018-12-16 NOTE — Assessment & Plan Note (Signed)
Followed by urology and started on Flomax.  Has foley in place. Continue to collaborate with urology team.  Monitor for s/s infection with foley.  Urgent referral to hospice for comfort focus.  Patient wishing minimal intervention and no further hospitalizations.

## 2018-12-16 NOTE — Progress Notes (Signed)
BP (!) 92/50    Pulse 98 Comment: apical   Temp 97.6 F (36.4 C) (Oral)    Subjective:    Patient ID: [name], male    DOB: January 19, 1934, 85 y.o.   MRN: [id number]  HPI: [name] is a 84 y.o. male  Chief Complaint  Patient presents with   Rehab Follow Up   His tow daughters and granddaughter are present at bedside today to assist in conversation.    DYSPHAGIA: Has been followed for this since 11/24/17, followed by GI.  Scheduled for a repeat swallow test on 12/22/2018.  During recent hospitalization he failed swallow tests and PEG was placed on 10/03/2018, at this time is to receive Osmolite five times a day via PEG, but his daughters report he only will take a couple cans a day.  He states he just feels full.  While hospitalized he did have infection to PEG tube site, which has since resolved and he denies any tenderness, redness, or drainage to site.  Went to [facility] for rehab after hospitalization.  He was seen by palliative team while in hospital and it was recommended they follow outpatient as well.  He reports his goal is not to keep feeding tube as he misses eating food.   PROSTATE CANCER: Back in 2003 and had cryoablation with Dr. Jacqlyn Larsen and was told cancer was gone. Recently in hospital his PSA was elevated in the setting of having foley catheter in place, on 09/28/2018 was 12.8.  He is currently being followed by [facility] with urology for ongoing foley catheter and urinary retention, last saw 10/27/18 and was started on Flomax. They recently did trial removal of catheter, but patient was unable to void and foley was placed back in.  Plan is to repeat PSA with urology at end of October.  Patient endorses frustration with foley.  IRON DEFICIENCY ANEMIA: Currently followed by Dr. Tasia Catchings for anemia and is receiving IV Venofer, last 11/22/2018.  Has also been noted to have elevation in WBC, which they are following.  On 11/16/2018 WBC 21,000 and HB 7.9.  Did have UTI with treatment in  mid-August at [facility], treated with Rocephin.  On review of notes plan is to monitor WBC closely and if no improvement despite no infection, may consider bone marrow biopsy or CT pelvis with elevated PSA. He is to see Dr. Tasia Catchings and have lengthy lab work-up on Monday.  GOALS OF CARE: Discussed at length with patient and family today.  He is a DNR.  He DOES NOT WANT TO GO BACK TO HOSPITAL OR ARTIFICIAL LIFE SUPPORT, but is okay with IV in home if needed.  Currently has feeding tube and foley catheter which are frustrations for him.  We discussed quality of life vs quantity when it comes to focus on goals of care.  At this time he gives thumbs down in regard to his quality of life.  He endorses that he is not where he was several months ago.  Is a past [employer] of 10 years.  We discussed at length hospice level care, which he would be candidate for at this time.  His family and him agree this is something they would like as soon as possible.  Patient reports that when he passes he would like to be at home and comfortable.  [name] does endorse he is tired of having frequent lab draws and does not want labs done today.  When asked about mood, he states this is  fine, he is "just tired".  Currently has home health in home twice a week, nursing.  His daughters report nursing is concerned with skin breakdown as he is not taking in calories he should be and is losing weight.  They have been placing water in feeding tube at home per recommendation of home health, as he has orthostatic BP at baseline and concern for falls.  Since discharge from rehab he has had two falls without injury, except for a few abrasions to upper back.    Relevant past medical, surgical, family and social history reviewed and updated as indicated. Interim medical history since our last visit reviewed. Allergies and medications reviewed and updated.  Review of Systems  Constitutional: Positive for fatigue. Negative for activity change,  diaphoresis and fever.  Respiratory: Negative for cough, chest tightness, shortness of breath and wheezing.   Cardiovascular: Negative for chest pain, palpitations and leg swelling.  Gastrointestinal: Negative for abdominal distention, abdominal pain, constipation, diarrhea, nausea and vomiting.  Endocrine: Negative for cold intolerance and heat intolerance.  Skin: Negative.   Psychiatric/Behavioral: Negative.     Per HPI unless specifically indicated above     Objective:    BP (!) 92/50    Pulse 98 Comment: apical   Temp 97.6 F (36.4 C) (Oral)   Wt Readings from Last 3 Encounters:  11/22/18 113 lb 9.6 oz (51.5 kg)  10/26/18 117 lb 6.4 oz (53.3 kg)  10/14/18 116 lb 12.8 oz (53 kg)    Physical Exam Vitals signs and nursing note reviewed.  Constitutional:      General: He is awake. He is not in acute distress.    Appearance: He is well-developed. He is cachectic. He is ill-appearing.     Comments: Frail appearing.  HENT:     Head: Normocephalic and atraumatic.      Right Ear: Hearing normal. No drainage.     Left Ear: Hearing normal. No drainage.  Eyes:     General: Lids are normal.        Right eye: No discharge.        Left eye: No discharge.     Conjunctiva/sclera: Conjunctivae normal.     Pupils: Pupils are equal, round, and reactive to light.  Neck:     Musculoskeletal: Normal range of motion and neck supple.     Vascular: No carotid bruit.  Cardiovascular:     Rate and Rhythm: Normal rate and regular rhythm.     Heart sounds: Normal heart sounds, S1 normal and S2 normal. No murmur. No gallop.      Comments: Apical HR 98 Pulmonary:     Effort: Pulmonary effort is normal. No accessory muscle usage or respiratory distress.     Breath sounds: Normal breath sounds.  Abdominal:     General: Bowel sounds are normal.     Palpations: Abdomen is soft.    Musculoskeletal: Normal range of motion.     Right lower leg: No edema.     Left lower leg: No edema.  Skin:     General: Skin is warm and dry.          Comments: Multiple areas of pale purple to dark purple bruising to bilateral upper arms.  Neurological:     Mental Status: He is alert and oriented to person, place, and time.     Deep Tendon Reflexes: Reflexes are normal and symmetric.  Psychiatric:        Attention and Perception: Attention normal.  Mood and Affect: Mood normal.        Speech: Speech normal.        Behavior: Behavior normal. Behavior is cooperative.        Thought Content: Thought content normal.        Judgment: Judgment normal.     Results for orders placed or performed in visit on 11/21/18  Retic Panel  Result Value Ref Range   Retic Ct Pct 1.5 0.4 - 3.1 %   RBC. 3.13 (L) 4.22 - 5.81 MIL/uL   Retic Count, Absolute 46.6 19.0 - 186.0 K/uL   Immature Retic Fract 14.1 2.3 - 15.9 %   Reticulocyte Hemoglobin 22.5 (L) >27.9 pg  Ferritin  Result Value Ref Range   Ferritin 1,157 (H) 24 - 336 ng/mL  Iron and TIBC  Result Value Ref Range   Iron 5 (L) 45 - 182 ug/dL   TIBC 117 (L) 250 - 450 ug/dL   Saturation Ratios 4 (L) 17.9 - 39.5 %   UIBC 113 ug/dL  CBC with Differential/Platelet  Result Value Ref Range   WBC 24.3 (H) 4.0 - 10.5 K/uL   RBC 3.13 (L) 4.22 - 5.81 MIL/uL   Hemoglobin 8.0 (L) 13.0 - 17.0 g/dL   HCT 25.6 (L) 39.0 - 52.0 %   MCV 81.8 80.0 - 100.0 fL   MCH 25.6 (L) 26.0 - 34.0 pg   MCHC 31.3 30.0 - 36.0 g/dL   RDW 18.1 (H) 11.5 - 15.5 %   Platelets 943 (HH) 150 - 400 K/uL   nRBC 0.0 0.0 - 0.2 %   Neutrophils Relative % 86 %   Neutro Abs 20.9 (H) 1.7 - 7.7 K/uL   Lymphocytes Relative 6 %   Lymphs Abs 1.5 0.7 - 4.0 K/uL   Monocytes Relative 5 %   Monocytes Absolute 1.2 (H) 0.1 - 1.0 K/uL   Eosinophils Relative 1 %   Eosinophils Absolute 0.1 0.0 - 0.5 K/uL   Basophils Relative 0 %   Basophils Absolute 0.1 0.0 - 0.1 K/uL   Smear Review Normal platelet morphology    Immature Granulocytes 2 %   Abs Immature Granulocytes 0.50 (H) 0.00 - 0.07 K/uL    Hold Tube- Blood Bank  Result Value Ref Range   Blood Bank Specimen SAMPLE AVAILABLE FOR TESTING    Sample Expiration      11/24/2018,2359 Performed at [facility], [address] [place], [place] [address]       Assessment & Plan:   Problem List Items Addressed This Visit      Digestive   Dysphagia - Primary    Urgent referral to hospice.  Patient wishing minimal intervention and no further hospitalizations.  Continue to collaborate with GI and continue Osmolite and water through tube at home.  Monitor for s/s of infection and treat accordingly if present.          Other   Elevated PSA    Followed by urology and started on Flomax.  Has foley in place. Continue to collaborate with urology team.  Monitor for s/s infection with foley.  Urgent referral to hospice for comfort focus.  Patient wishing minimal intervention and no further hospitalizations.       History of prostate cancer    Followed by urology, continue to collaborate with them and review notes.      Foley catheter present    Currently in place, secondary to urinary retention.  Continue to collaborate with urology team.  Urgent  referral to hospice per patient and family request for quality of life focus.      Alteration in nutrition associated with tube feeding   Relevant Orders   Ambulatory referral to Hospice   At high risk for falls    Secondary to frailty with current conditions.  Urgent referral to hospice.  Patient wishing minimal intervention and no further hospitalizations.       Failure to thrive in adult    Urgent referral to hospice.  Patient wishing minimal intervention and no further hospitalizations.  Family and patient wish more comfort and quality of life approach.  No labs today per patient request.         Time: 25 minutes, >50% spent counseling/or care coordination on goal of care and hospice   Follow up plan: Return in about 2 months (around 02/15/2019) for Follow-up.

## 2018-12-16 NOTE — Patient Instructions (Signed)
Hospice °Hospice is a service that is designed to provide people who are terminally ill and their families with medical, spiritual, and psychological support. Its aim is to improve your quality of life by keeping you as comfortable as possible in the final stages of life. °Who will be my providers when I begin hospice care? °Hospice teams often include: °· A nurse. °· A doctor. The hospice doctor will be available for your care, but you can include your regular doctor or nurse practitioner. °· A social worker. °· A counselor. °· A religious leader (such as a chaplain). °· A dietitian. °· Therapists. °· Trained volunteers who can help with care. °What services does hospice provide? °Hospice services can vary depending on the center or organization. Generally, they include: °· Ways to keep you comfortable, such as: °? Providing care in your home or in a home-like setting. °? Working with your family and friends to help meet your needs. °? Allowing you to enjoy the support of loved ones by receiving much of your basic care from family and friends. °· Pain relief and symptom management. The staff will supply all necessary medicines and equipment so that you can stay comfortable and alert enough to enjoy the company of your friends and family. °· Visits or care from a nurse and doctor. This may include 24-hour on-call services. °· Companionship when you are alone. °· Allowing you and your family to rest. Hospice staff may do light housekeeping, prepare meals, and run errands. °· Counseling. They will make sure your emotional, spiritual, and social needs are being met, as well as those needs of your family members. °· Spiritual care. This will be individualized to meet your needs and your family's needs. It may involve: °? Helping you and your family understand the dying process. °? Helping you say goodbye to your family and friends. °? Performing a specific religious ceremony or ritual. °· Massage. °· Nutrition  therapy. °· Physical and occupational therapy. °· Short-term inpatient care, if something cannot be managed in the home. °· Art or music therapy. °· Bereavement support for grieving family members. °When should hospice care begin? °Most people who use hospice are believed to have less than 6 months to live. °· Your family and health care providers can help you decide when hospice services should begin. °· If you live longer than 6 months but your condition does not improve, your doctor may be able to approve you for continued hospice care. °· If your condition improves, you may discontinue the program. °What should I consider before selecting a program? °Most hospice programs are run by nonprofit, independent organizations. Some are affiliated with hospitals, nursing homes, or home health care agencies. Hospice programs can take place in your home or at a hospice center, hospital, or skilled nursing facility. When choosing a hospice program, ask the following questions: °· What services are available to me? °· What services will be offered to my loved ones? °· How involved will my loved ones be? °· How involved will my health care provider be? °· Who makes up the hospice care team? How are they trained or screened? °· How will my pain and symptoms be managed? °· If my circumstances change, can the services be provided in a different setting, such as my home or in the hospital? °· Is the program reviewed and licensed by the state or certified in some other way? °· What does it cost? Is it covered by insurance? °· If I choose a hospice   center or nursing home, where is the hospice center located? Is it convenient for family and friends? °· If I choose a hospice center or nursing home, can my family and friends visit any time? °· Will you provide emotional and spiritual support? °· Who can my family call with questions? °Where can I learn more about hospice? °You can learn about existing hospice programs in your area  from your health care providers. You can also read more about hospice online. The websites of the following organizations have helpful information: °· National Hospice and Palliative Care Organization (NHPCO): www.nhpco.org °· National Association for Home Care & Hospice (NAHC): www.nahc.org °· Hospice Foundation of America (HFA): www.hospicefoundation.org °· American Cancer Society (ACS): www.cancer.org °· Hospice Net: www.hospicenet.org °· Visiting Nurse Associations of America (VNAA): www.vnaa.org °You may also find more information by contacting the following agencies: °· A local agency on aging. °· Your local United Way chapter. °· Your state's department of health or social services. °Summary °· Hospice is a service that is designed to provide people who are terminally ill and their families with medical, spiritual, and psychological support. °· Hospice aims to improve your quality of life by keeping you as comfortable as possible in the final stages of life. °· Hospice teams often include a doctor, nurse, social worker, counselor, religious leader,dietitian, therapists, and volunteers. °· Hospice care generally includes medicine for symptom management, visits from doctors and nurses, physical and occupational therapy, nutrition counseling, spiritual and emotional counseling, caregiver support, and bereavement support for grieving family members. °· Hospice programs can take place in your home or at a hospice center, hospital, or skilled nursing facility. °This information is not intended to replace advice given to you by your health care provider. Make sure you discuss any questions you have with your health care provider. °Document Released: 07/03/2003 Document Revised: 02/26/2017 Document Reviewed: 04/07/2016 °Elsevier Patient Education © 2020 Elsevier Inc. ° °

## 2018-12-16 NOTE — Assessment & Plan Note (Signed)
Urgent referral to hospice.  Patient wishing minimal intervention and no further hospitalizations.  Continue to collaborate with GI and continue Osmolite and water through tube at home.  Monitor for s/s of infection and treat accordingly if present.

## 2018-12-16 NOTE — Assessment & Plan Note (Addendum)
Currently in place, secondary to urinary retention.  Continue to collaborate with urology team.  Urgent referral to hospice per patient and family request for quality of life focus.

## 2018-12-16 NOTE — Assessment & Plan Note (Signed)
Urgent referral to hospice.  Patient wishing minimal intervention and no further hospitalizations.  Family and patient wish more comfort and quality of life approach.  No labs today per patient request.

## 2018-12-16 NOTE — Telephone Encounter (Signed)
Pt has been admitted into hospice, RN wants call back to ask questions with NP Cannady/ Dr. Wynetta Emery   [phone] Paulla Fore, RN

## 2018-12-16 NOTE — Assessment & Plan Note (Signed)
Secondary to frailty with current conditions.  Urgent referral to hospice.  Patient wishing minimal intervention and no further hospitalizations.

## 2018-12-16 NOTE — Telephone Encounter (Signed)
Attempted call and answering service answered.  They took provider call back information for the RN to call office.

## 2018-12-16 NOTE — Telephone Encounter (Signed)
Copied from [facility] [id number]. Topic: General - Call Back - No Documentation >> Dec 16, 2018 12:55 PM Erick Blinks wrote: Reason for CRM: Pt has been admitted into hospice, RN wants call back to ask questions with NP Shatia Sindoni/ Dr. Wynetta Emery   [phone] Paulla Fore, RN

## 2018-12-16 NOTE — Assessment & Plan Note (Signed)
Followed by urology, continue to collaborate with them and review notes.

## 2018-12-19 ENCOUNTER — Encounter: Payer: Self-pay | Admitting: Oncology

## 2018-12-19 ENCOUNTER — Inpatient Hospital Stay: Payer: Medicare Other

## 2018-12-19 NOTE — Telephone Encounter (Signed)
Canceled patient appointment. Sending for [employer]

## 2018-12-20 ENCOUNTER — Ambulatory Visit: Payer: Medicare Other | Admitting: Oncology

## 2018-12-20 ENCOUNTER — Ambulatory Visit: Payer: Medicare Other

## 2018-12-21 ENCOUNTER — Ambulatory Visit: Payer: Medicare Other

## 2018-12-21 ENCOUNTER — Ambulatory Visit: Payer: Medicare Other | Admitting: Oncology

## 2018-12-22 ENCOUNTER — Ambulatory Visit: Payer: Medicare Other

## 2018-12-26 ENCOUNTER — Ambulatory Visit: Payer: Medicare Other

## 2018-12-26 ENCOUNTER — Telehealth: Payer: Self-pay | Admitting: Nurse Practitioner

## 2018-12-26 NOTE — Telephone Encounter (Signed)
Called and spoke with [facility], provided clarification.

## 2018-12-26 NOTE — Telephone Encounter (Signed)
Copied from [place] [id number]. Topic: General - Inquiry >> Dec 26, 2018 11:44 AM Berneta Levins wrote: Reason for CRM:   Malinda from [facility] calling because they need clarification on pt's diagnosis for coding purposes. Cinda Quest can be reached at [phone]

## 2018-12-27 ENCOUNTER — Ambulatory Visit: Payer: Medicare Other | Admitting: Gastroenterology

## 2018-12-29 DEATH — deceased

## 2019-01-06 ENCOUNTER — Ambulatory Visit: Payer: Self-pay | Admitting: *Deleted

## 2019-01-06 ENCOUNTER — Telehealth: Payer: Self-pay

## 2019-01-06 DIAGNOSIS — R1310 Dysphagia, unspecified: Secondary | ICD-10-CM

## 2019-01-06 NOTE — Chronic Care Management (AMB) (Signed)
  Chronic Care Management   Note  01/06/2019 Name: [name] MRN: [id number] DOB: [birth year]  Message received from [place] me aware this patient has been admitted to [facility] and has no CCM needs at this time.   Follow up plan: The care management team is available to follow up with the patient/caregiver if PCP feels this is still a need.   Merlene Morse Jerrika Ledlow RN, BSN Nurse Case Editor, [facility]  [phone]) Business Mobile

## 2019-01-27 ENCOUNTER — Other Ambulatory Visit: Payer: Medicare Other

## 2019-01-30 ENCOUNTER — Ambulatory Visit: Payer: Medicare Other | Admitting: Urology

## 2019-03-02 ENCOUNTER — Ambulatory Visit: Payer: Medicare Other | Admitting: Urology

## 2019-12-11 IMAGING — CT CT HEAD WITHOUT CONTRAST
1 series · 15 of 30 positions shown, 19 images · non-contrast
Comparison: 10/05/2018, 09/21/2018 and MRI brain 09/21/2018

CLINICAL DATA: Follow-up subdural hematoma.

EXAM:
CT HEAD WITHOUT CONTRAST
TECHNIQUE: Contiguous axial images were obtained from the base of the skull
through the vertex without intravenous contrast.

[Series 2: head wo · axial · 0.42mm/px · z∈[-121,+14]mm · 15 of 30 slices shown, 19 images]
[im 2/30  brain]
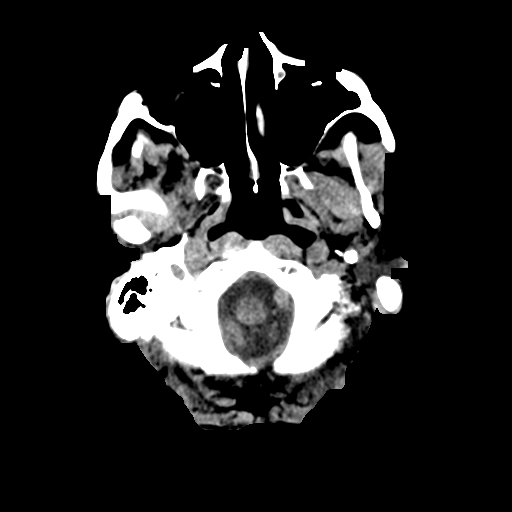
[im 2/30  bone]
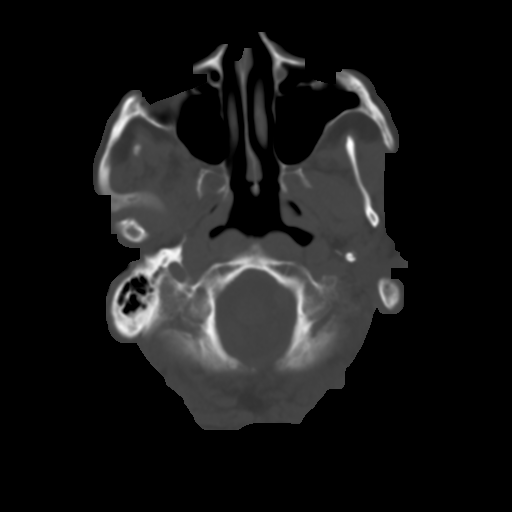
[im 4/30  brain]
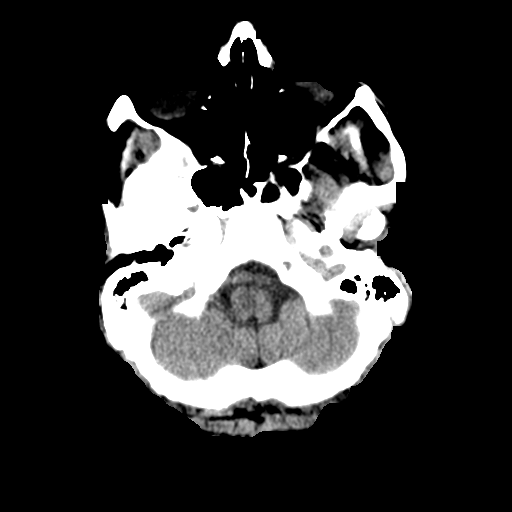
[im 6/30  brain]
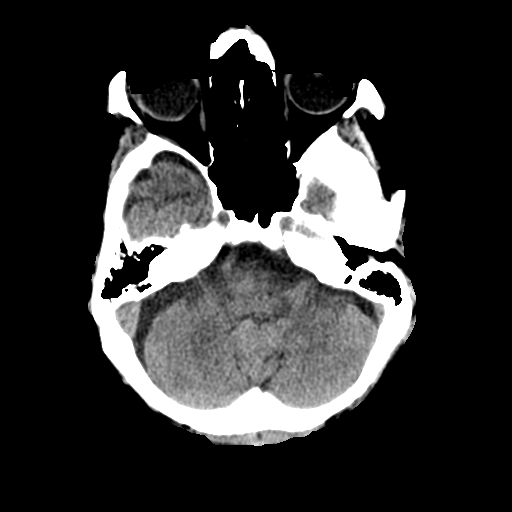
[im 8/30  brain]
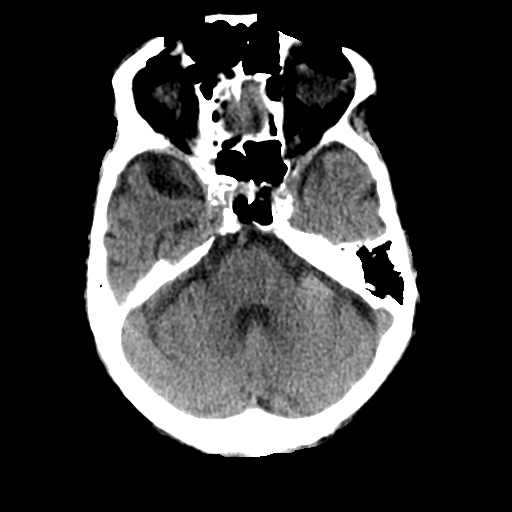
[im 10/30  brain]
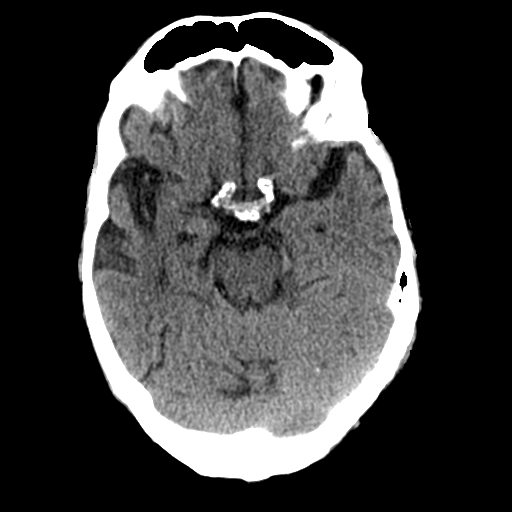
[im 10/30  bone]
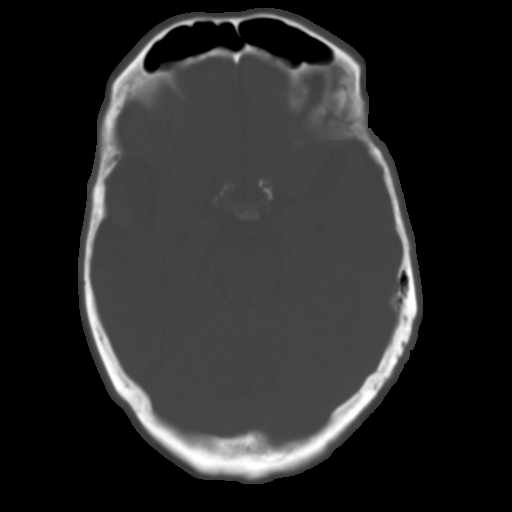
[im 12/30  brain]
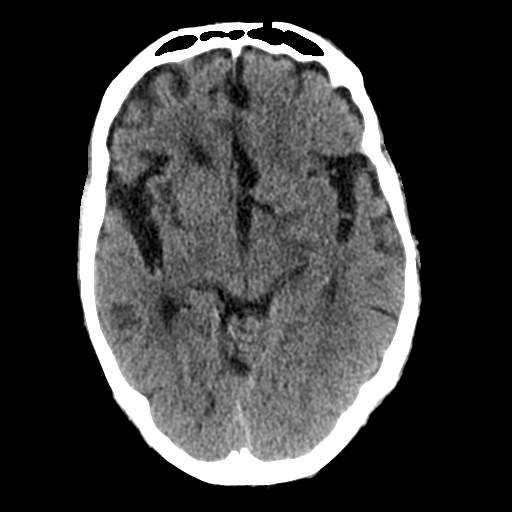
[im 14/30  brain]
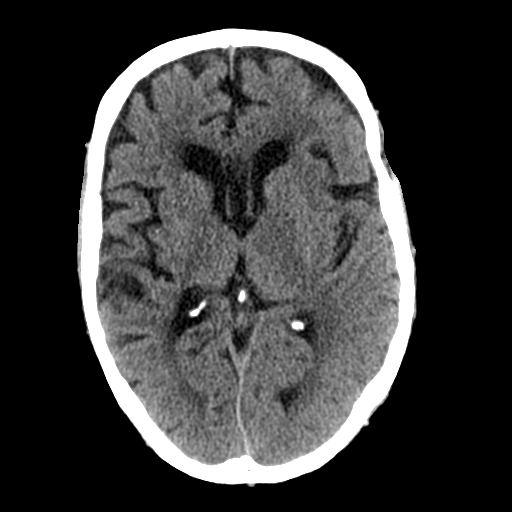
[im 16/30  brain]
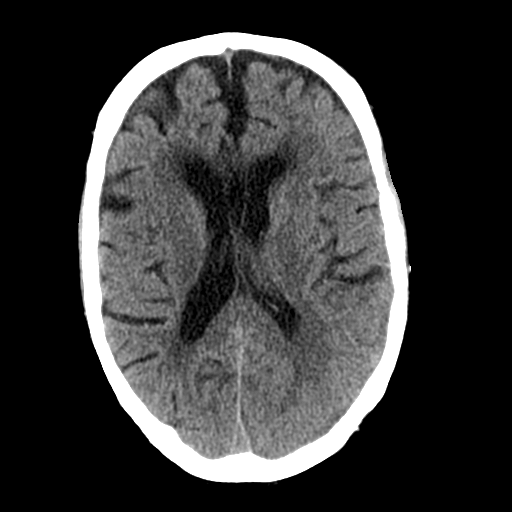
[im 17/30  brain]
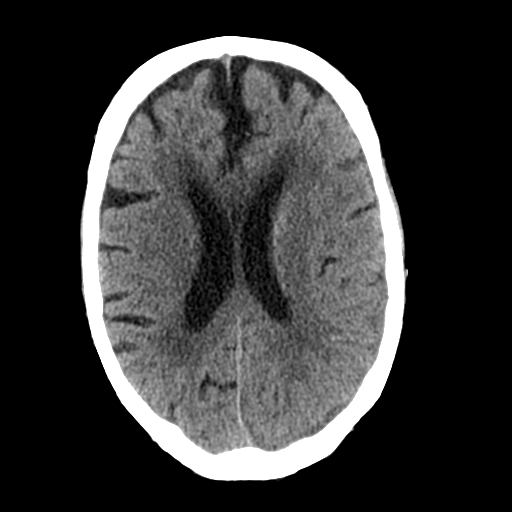
[im 17/30  bone]
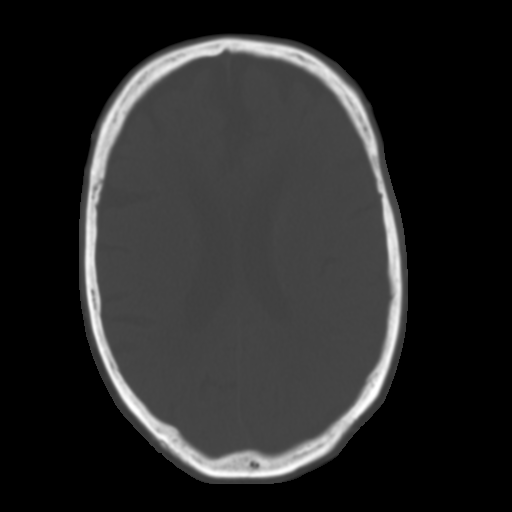
[im 19/30  brain]
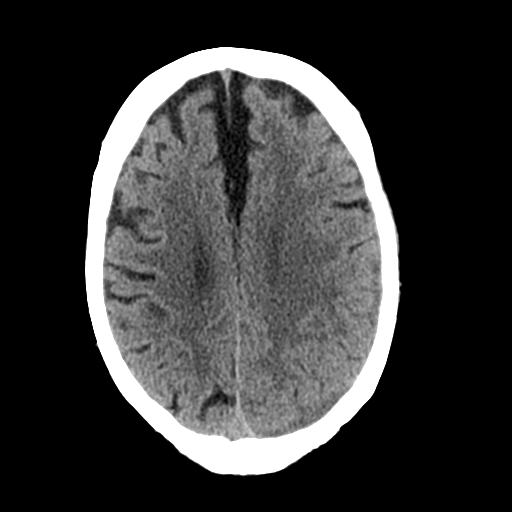
[im 21/30  brain]
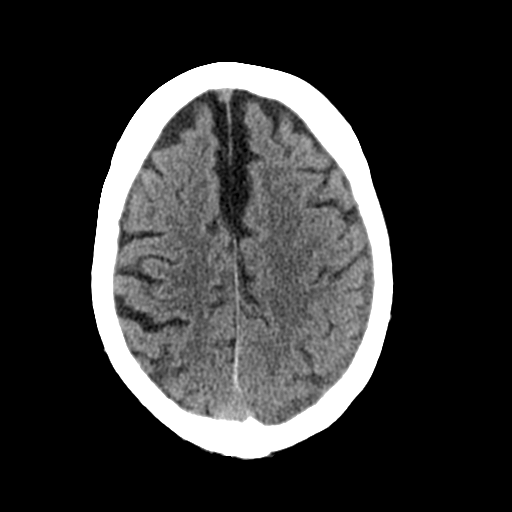
[im 23/30  brain]
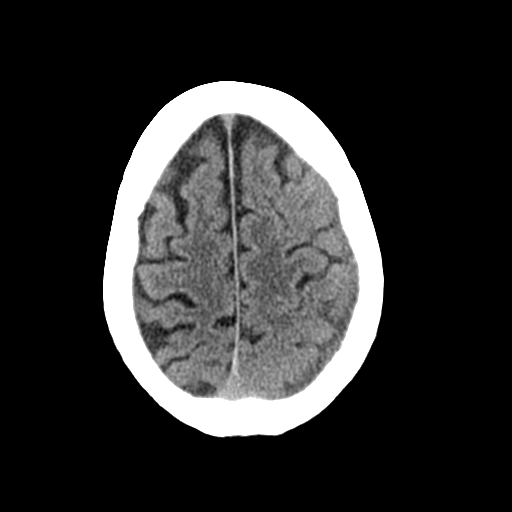
[im 25/30  brain]
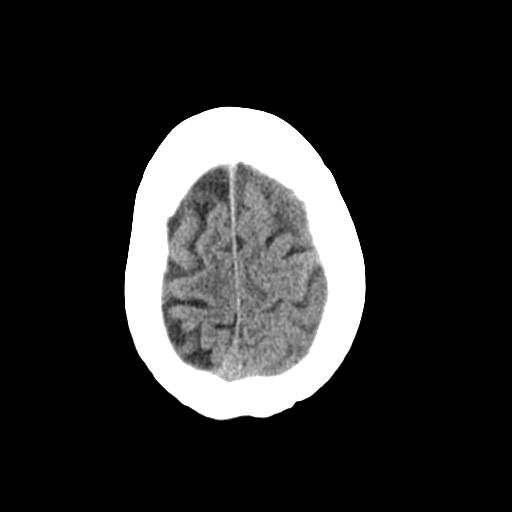
[im 25/30  bone]
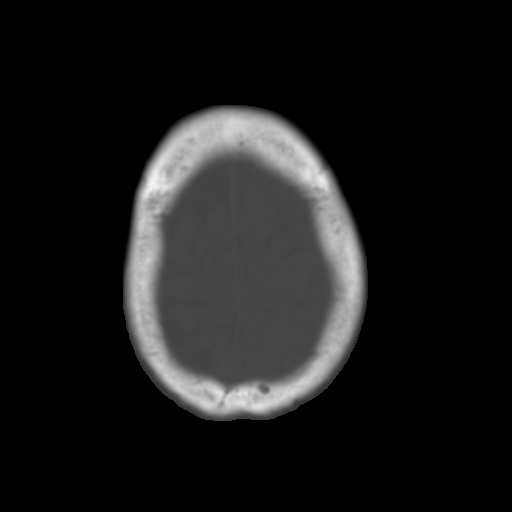
[im 27/30  brain]
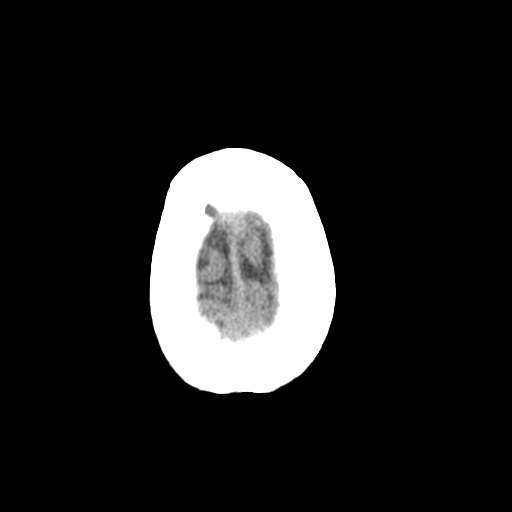
[im 29/30  brain]
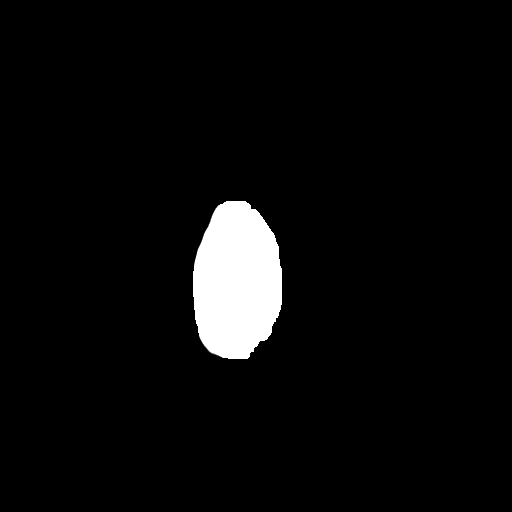

[15 of 30 positions shown; findings below may reference images not displayed]

FINDINGS: Brain: Ventricles, cisterns and other CSF spaces are unchanged with
mild age related atrophy. Chronic ischemic microvascular disease is
present. There is no evidence of mass or midline shift. No
intraparenchymal hemorrhage. Evidence of continued improvement of
patient's small left convexity subacute subdural hematoma measuring
4 mm in thickness (previously 8 mm). No acute hemorrhagic component
as seen previously. Subtle local sulcal effacement. Remainder the
exam is unchanged.

Vascular: No hyperdense vessel or unexpected calcification.

Skull: Normal. Negative for fracture or focal lesion.

Sinuses/Orbits: No acute finding.

Other: None.
IMPRESSION: Improving small left convexity subacute subdural hematoma now
measuring 4 mm in thickness (previously 8 mm). No new findings.

Mild atrophy and chronic ischemic microvascular disease.
